# Patient Record
Sex: Male | Born: 1955
Health system: Southern US, Community
[De-identification: ages and names within clinical notes are randomized; demographics above are authoritative.]

## PROBLEM LIST (undated history)

## (undated) DIAGNOSIS — I499 Cardiac arrhythmia, unspecified: Secondary | ICD-10-CM

## (undated) DIAGNOSIS — I429 Cardiomyopathy, unspecified: Secondary | ICD-10-CM

## (undated) DIAGNOSIS — I82409 Acute embolism and thrombosis of unspecified deep veins of unspecified lower extremity: Secondary | ICD-10-CM

## (undated) DIAGNOSIS — G4733 Obstructive sleep apnea (adult) (pediatric): Secondary | ICD-10-CM

## (undated) DIAGNOSIS — I2699 Other pulmonary embolism without acute cor pulmonale: Secondary | ICD-10-CM

## (undated) DIAGNOSIS — E785 Hyperlipidemia, unspecified: Secondary | ICD-10-CM

## (undated) DIAGNOSIS — I878 Other specified disorders of veins: Secondary | ICD-10-CM

## (undated) DIAGNOSIS — I10 Essential (primary) hypertension: Secondary | ICD-10-CM

## (undated) DIAGNOSIS — G4730 Sleep apnea, unspecified: Secondary | ICD-10-CM

## (undated) DIAGNOSIS — L0390 Cellulitis, unspecified: Secondary | ICD-10-CM

## (undated) DIAGNOSIS — Z9989 Dependence on other enabling machines and devices: Secondary | ICD-10-CM

## (undated) DIAGNOSIS — IMO0002 Reserved for concepts with insufficient information to code with codable children: Secondary | ICD-10-CM

## (undated) HISTORY — PX: LEG SURGERY: SHX1003

## (undated) HISTORY — DX: Obstructive sleep apnea (adult) (pediatric): G47.33

## (undated) HISTORY — DX: Hyperlipidemia, unspecified: E78.5

## (undated) HISTORY — PX: CATARACT EXTRACTION: SUR2

## (undated) HISTORY — DX: Dependence on other enabling machines and devices: Z99.89

## (undated) HISTORY — DX: Cardiomyopathy, unspecified: I42.9

---

## 2002-09-19 ENCOUNTER — Encounter: Payer: Self-pay | Admitting: Family Medicine

## 2002-09-19 ENCOUNTER — Encounter: Admission: RE | Admit: 2002-09-19 | Discharge: 2002-09-19 | Payer: Self-pay | Admitting: Family Medicine

## 2003-01-08 ENCOUNTER — Emergency Department (HOSPITAL_COMMUNITY): Admission: EM | Admit: 2003-01-08 | Discharge: 2003-01-08 | Payer: Self-pay | Admitting: Emergency Medicine

## 2007-10-03 DIAGNOSIS — I10 Essential (primary) hypertension: Secondary | ICD-10-CM | POA: Insufficient documentation

## 2007-10-03 DIAGNOSIS — E119 Type 2 diabetes mellitus without complications: Secondary | ICD-10-CM

## 2007-10-03 DIAGNOSIS — R9431 Abnormal electrocardiogram [ECG] [EKG]: Secondary | ICD-10-CM

## 2008-09-04 ENCOUNTER — Observation Stay (HOSPITAL_COMMUNITY): Admission: EM | Admit: 2008-09-04 | Discharge: 2008-09-05 | Payer: Self-pay | Admitting: Emergency Medicine

## 2008-09-04 ENCOUNTER — Encounter: Admission: RE | Admit: 2008-09-04 | Discharge: 2008-09-04 | Payer: Self-pay | Admitting: Family Medicine

## 2009-02-20 ENCOUNTER — Encounter (HOSPITAL_BASED_OUTPATIENT_CLINIC_OR_DEPARTMENT_OTHER): Admission: RE | Admit: 2009-02-20 | Discharge: 2009-05-21 | Payer: Self-pay | Admitting: General Surgery

## 2009-09-03 ENCOUNTER — Inpatient Hospital Stay (HOSPITAL_COMMUNITY): Admission: EM | Admit: 2009-09-03 | Discharge: 2009-09-06 | Payer: Self-pay | Admitting: Emergency Medicine

## 2010-06-16 ENCOUNTER — Emergency Department (HOSPITAL_COMMUNITY): Admission: EM | Admit: 2010-06-16 | Discharge: 2010-06-17 | Payer: Self-pay | Admitting: Emergency Medicine

## 2010-10-27 LAB — COMPREHENSIVE METABOLIC PANEL
ALT: 30 U/L (ref 0–53)
AST: 29 U/L (ref 0–37)
CO2: 28 mEq/L (ref 19–32)
Calcium: 9.3 mg/dL (ref 8.4–10.5)
Creatinine, Ser: 0.9 mg/dL (ref 0.4–1.5)
GFR calc Af Amer: >60 mL/min (ref >60)
GFR calc non Af Amer: >60 mL/min (ref >60)
Sodium: 139 mEq/L (ref 135–145)
Total Protein: 7.4 g/dL (ref 6.0–8.3)

## 2010-10-27 LAB — POCT CARDIAC MARKERS
CKMB, poc: 1.3 ng/mL (ref 1.0–8.0)
Troponin i, poc: <0.05 ng/mL (ref 0.00–0.09)

## 2010-10-27 LAB — CBC
Hemoglobin: 13.1 g/dL (ref 13.0–17.0)
MCH: 27.3 pg (ref 26.0–34.0)
MCHC: 32.1 g/dL (ref 30.0–36.0)
RDW: 14.1 % (ref 11.5–15.5)

## 2010-10-27 LAB — PROTIME-INR: Prothrombin Time: 13.5 seconds (ref 11.6–15.2)

## 2010-10-27 LAB — GLUCOSE, CAPILLARY: Glucose-Capillary: 141 mg/dL — ABNORMAL HIGH (ref 70–99)

## 2010-11-01 LAB — GLUCOSE, CAPILLARY
Glucose-Capillary: 111 mg/dL — ABNORMAL HIGH (ref 70–99)
Glucose-Capillary: 122 mg/dL — ABNORMAL HIGH (ref 70–99)
Glucose-Capillary: 129 mg/dL — ABNORMAL HIGH (ref 70–99)
Glucose-Capillary: 138 mg/dL — ABNORMAL HIGH (ref 70–99)
Glucose-Capillary: 143 mg/dL — ABNORMAL HIGH (ref 70–99)
Glucose-Capillary: 146 mg/dL — ABNORMAL HIGH (ref 70–99)
Glucose-Capillary: 152 mg/dL — ABNORMAL HIGH (ref 70–99)
Glucose-Capillary: 200 mg/dL — ABNORMAL HIGH (ref 70–99)

## 2010-11-01 LAB — DIFFERENTIAL
Basophils Relative: 3 % — ABNORMAL HIGH (ref 0–1)
Eosinophils Absolute: 0.5 10*3/uL (ref 0.0–0.7)
Eosinophils Relative: 4 % (ref 0–5)
Monocytes Absolute: 0.8 10*3/uL (ref 0.1–1.0)
Monocytes Relative: 7 % (ref 3–12)

## 2010-11-01 LAB — COMPREHENSIVE METABOLIC PANEL
AST: 24 U/L (ref 0–37)
CO2: 27 mEq/L (ref 19–32)
Calcium: 8.5 mg/dL (ref 8.4–10.5)
Creatinine, Ser: 0.89 mg/dL (ref 0.4–1.5)
GFR calc Af Amer: >60 mL/min (ref >60)
GFR calc non Af Amer: >60 mL/min (ref >60)
Total Protein: 7.1 g/dL (ref 6.0–8.3)

## 2010-11-01 LAB — CBC
HCT: 42.8 % (ref 39.0–52.0)
Hemoglobin: 14.5 g/dL (ref 13.0–17.0)
MCHC: 33.8 g/dL (ref 30.0–36.0)
MCV: 84.1 fL (ref 78.0–100.0)
RBC: 5.09 MIL/uL (ref 4.22–5.81)
RDW: 14.4 % (ref 11.5–15.5)

## 2010-11-01 LAB — BASIC METABOLIC PANEL
BUN: 11 mg/dL (ref 6–23)
CO2: 24 mEq/L (ref 19–32)
CO2: 27 mEq/L (ref 19–32)
Chloride: 98 mEq/L (ref 96–112)
Chloride: 99 mEq/L (ref 96–112)
Creatinine, Ser: 0.81 mg/dL (ref 0.4–1.5)
GFR calc Af Amer: >60 mL/min (ref >60)
Glucose, Bld: 127 mg/dL — ABNORMAL HIGH (ref 70–99)
Glucose, Bld: 135 mg/dL — ABNORMAL HIGH (ref 70–99)
Sodium: 135 mEq/L (ref 135–145)

## 2010-11-01 LAB — CARDIAC PANEL(CRET KIN+CKTOT+MB+TROPI)
CK, MB: 2.9 ng/mL (ref 0.3–4.0)
Total CK: 128 U/L (ref 7–232)
Troponin I: <0.01 ng/mL (ref 0.00–0.06)

## 2010-11-01 LAB — POCT CARDIAC MARKERS: Troponin i, poc: <0.05 ng/mL (ref 0.00–0.09)

## 2010-11-01 LAB — PROTIME-INR
INR: 1.96 — ABNORMAL HIGH (ref 0.00–1.49)
Prothrombin Time: 22.2 seconds — ABNORMAL HIGH (ref 11.6–15.2)

## 2010-11-01 LAB — CK TOTAL AND CKMB (NOT AT ARMC)
CK, MB: 3.1 ng/mL (ref 0.3–4.0)
Relative Index: 2 (ref 0.0–2.5)
Total CK: 157 U/L (ref 7–232)

## 2010-11-01 LAB — TSH: TSH: 1.077 u[IU]/mL (ref 0.350–4.500)

## 2010-11-30 LAB — URINALYSIS, ROUTINE W REFLEX MICROSCOPIC
Hgb urine dipstick: NEGATIVE
Leukocytes, UA: NEGATIVE
Nitrite: NEGATIVE
Protein, ur: 100 mg/dL — AB
Specific Gravity, Urine: 1.028 (ref 1.005–1.030)
Urobilinogen, UA: 0.2 mg/dL (ref 0.0–1.0)

## 2010-11-30 LAB — URINE MICROSCOPIC-ADD ON

## 2010-11-30 LAB — PROTEIN C ACTIVITY: Protein C Activity: 200 % — ABNORMAL HIGH (ref 75–133)

## 2010-11-30 LAB — DIFFERENTIAL
Eosinophils Absolute: 0.5 10*3/uL (ref 0.0–0.7)
Lymphocytes Relative: 24 % (ref 12–46)
Lymphs Abs: 2.5 10*3/uL (ref 0.7–4.0)
Monocytes Relative: 6 % (ref 3–12)
Neutrophils Relative %: 66 % (ref 43–77)

## 2010-11-30 LAB — BETA-2-GLYCOPROTEIN I ABS, IGG/M/A
Beta-2 Glyco I IgG: 5 U/mL (ref <20)
Beta-2-Glycoprotein I IgA: 6 U/mL (ref <10)
Beta-2-Glycoprotein I IgM: <4 U/mL (ref <10)

## 2010-11-30 LAB — COMPREHENSIVE METABOLIC PANEL
ALT: 43 U/L (ref 0–53)
AST: 35 U/L (ref 0–37)
Calcium: 9 mg/dL (ref 8.4–10.5)
Creatinine, Ser: 0.81 mg/dL (ref 0.4–1.5)
GFR calc Af Amer: >60 mL/min (ref >60)
Glucose, Bld: 142 mg/dL — ABNORMAL HIGH (ref 70–99)
Sodium: 135 mEq/L (ref 135–145)
Total Protein: 7.6 g/dL (ref 6.0–8.3)

## 2010-11-30 LAB — PROTEIN S, TOTAL: Protein S Ag, Total: 99 % (ref 70–140)

## 2010-11-30 LAB — LUPUS ANTICOAGULANT PANEL: Lupus Anticoagulant: NOT DETECTED

## 2010-11-30 LAB — PROTEIN S ACTIVITY: Protein S Activity: 111 % (ref 69–129)

## 2010-11-30 LAB — PROTIME-INR
INR: 1 (ref 0.00–1.49)
Prothrombin Time: 13.9 seconds (ref 11.6–15.2)

## 2010-11-30 LAB — CBC
MCHC: 33.9 g/dL (ref 30.0–36.0)
RDW: 14.1 % (ref 11.5–15.5)

## 2010-11-30 LAB — ANTITHROMBIN III: AntiThromb III Func: 110 % (ref 76–126)

## 2010-11-30 LAB — FACTOR 5 LEIDEN

## 2010-11-30 LAB — BRAIN NATRIURETIC PEPTIDE: Pro B Natriuretic peptide (BNP): 34 pg/mL (ref 0.0–100.0)

## 2010-11-30 LAB — CARDIOLIPIN ANTIBODIES, IGG, IGM, IGA: Anticardiolipin IgA: 16 [APL'U] (ref <13)

## 2010-11-30 LAB — PROTHROMBIN GENE MUTATION

## 2010-12-29 NOTE — Assessment & Plan Note (Signed)
[facility]   NAME:  Victor Barnes, Victor Barnes NO.:  [id number]   MEDICAL RECORD NO.:  [id number]      DATE OF BIRTH:  12-16-1955   PHYSICIAN:  Ardath Sax, M.D.           VISIT DATE:                                   OFFICE VISIT   He comes back for recheck and we took off his boot and put a new Puracol  AG dressing on with hydrogel, and we wrapped it in Kerlix and a regular  Unna boot, and he is continued on his Keflex 500 mg 3 times a day.  He  feels like the ulcer is a lot smaller and especially it is much more  shallow.  It looks nice and clean, and he will come back in a week.      Ardath Sax, M.D.     PP/MEDQ  D:  03/27/2009  T:  03/28/2009  Job:  [id number]

## 2010-12-29 NOTE — Assessment & Plan Note (Signed)
[facility]   NAME:  Victor Barnes, Victor Barnes             ACCOUNT NO.:  [id number]   MEDICAL RECORD NO.:  [id number]      DATE OF BIRTH:  January 27, 1956   PHYSICIAN:  Lenon Curt. Chilton Si, M.D.   VISIT DATE:  03/14/2009                                   OFFICE VISIT   HISTORY:  A 55 year old male returns for recheck of wound of the right  lower extremity and he has had a fairly good week.  Denies any fever,  chills, or wound odor.  There has been minimal discomfort.   PHYSICAL EXAMINATION:  Temperature 98.1, pulse 80, respirations 20,  blood pressure 119/71.  Wound measurement now 2.1 x 0.7 x 0.2 cm of the  right lower leg wound.  There is a small pustule distal to the wound and  proximal to the wound.  The wound itself appears to have a good  granulation base at this time.  There is no slough or debris and there  is no real evidence for infection.  However, the patient does have a  prior positive culture for staph aureus species, which was sensitive to  most antibiotics.  Because of the proximity of the pustules to the  wound, we did culture of the pustule.   TREATMENT:  Puracol Plus Ag, hydrogel, Adaptic, and Unna boot was  applied and covered with Kerlix and Coban.  We gave him a prescription  for cephalexin 500 mg 30 tablets one 3 times daily for infection.   ICD-9 454.2 varicose vein with ulcer.  CPT [id number].      Lenon Curt Chilton Si, M.D.  Electronically Signed     AGG/MEDQ  D:  03/14/2009  T:  03/15/2009  Job:  [id number]

## 2010-12-29 NOTE — Assessment & Plan Note (Signed)
[facility]   NAME:  Victor Barnes, Victor Barnes             ACCOUNT NO.:  [id number]   MEDICAL RECORD NO.:  [id number]      DATE OF BIRTH:  Jul 03, 1956   PHYSICIAN:  Leonie Man, M.D.    VISIT DATE:  04/15/2009                                   OFFICE VISIT   PROBLEM:  Venous stasis disease with venous leg ulcer in the right lower  extremity in a 55 year old gentleman with current wound dimensions of  1.0 x 0.4 x 0.1, which is much smaller than when last seen on April 08, 2009.   The patient on examination today is afebrile with a temperature of 98.8,  pulse is 85, respirations 21, blood pressure is 143/75 with a capillary  blood glucose of 126.  The wound is completely granulated.  There is  some movement of epithelialization in from the side.  There is no  periwound edema.  There is no odor or drainage.   TREATMENT:  Today, collagen matrix dressing for the wound with an Unna  boot to be changed within 1 week.      Leonie Man, M.D.  Electronically Signed     PB/MEDQ  D:  04/15/2009  T:  04/16/2009  Job:  [id number]

## 2010-12-29 NOTE — Assessment & Plan Note (Signed)
[facility]   NAME:  Victor Barnes, UPTAIN             ACCOUNT NO.:  [id number]   MEDICAL RECORD NO.:  [id number]      DATE OF BIRTH:  1955-11-11   PHYSICIAN:  Leonie Man, M.D.    VISIT DATE:  02/25/2009                                   OFFICE VISIT   PROBLEM:  1. Diabetic foot ulcer, right leg medially.  2. History of deep vein thrombosis, right leg, on warfarin.   HISTORY OF PRESENT ILLNESS:  A 55 year old patient referred through the  courtesy of Dr. Laurann Montana for evaluation of this 2.5 x 1.3 x 0.3  right medial leg ulcer.  Mr. Victor Barnes has a history of a large infected  wound in this location some years previously.  This required extensive  debridement and hospitalization and antibiotic therapy.  He subsequently  developed a persistent right-sided leg edema and was later diagnosed  with the DVT.  He is currently on warfarin with an INR controlled at  approximately 2.1.   PAST MEDICAL HISTORY:  No known allergies.   CHRONIC CONDITIONS:  Hypertension, diabetes mellitus, and obesity.   CURRENT MEDICATIONS:  1. Glyburide and metformin 5/500 daily.  2. Warfarin 4 mg daily.  3. Furosemide 40 mg daily.  4. Lisinopril 10/12.5 daily.   REVIEW OF SYSTEMS:  A 15-point review of systems negative except as  outlined in the present illness and past medical history.   PHYSICAL EXAMINATION:  VITAL SIGNS:  Temperature 98.7, pulse 89,  respirations 19, and blood pressure 150/83.  HEAD AND NECK:  No thyromegaly or adenopathy.  Normal carotid upstrokes.  LUNGS:  Clear to auscultation bilaterally.  HEART:  Regular rate and rhythm without murmurs.  ABDOMEN:  Soft, nontender, and nondistended without palpable masses or  visceromegaly.  EXTREMITIES:  Right lower extremity edema with the right side much  larger than the left.  Ulcer is without odor or exudate.  There is  significant amount of slough within the wound and this is somewhat thick  and adherent.   ASSESSMENT:  1. Venous stasis disease with venous leg ulcer, deep vein thrombosis      by history, on anticoagulation therapy.  2. Diabetes mellitus.   TREATMENT:  Today, Santyl wet-to-dry dressings to the right lower  extremity, compression with the Unna boot, right lower extremity.  Cultures were done prior to this.   DISPOSITION:  Plan to return to clinic for change of Unna boots in 3  days which will be February 27, 2009.      Leonie Man, M.D.  Electronically Signed     PB/MEDQ  D:  02/26/2009  T:  02/27/2009  Job:  [id number]

## 2010-12-29 NOTE — Assessment & Plan Note (Signed)
[facility]   NAME:  Victor Barnes, Victor Barnes             ACCOUNT NO.:  [id number]   MEDICAL RECORD NO.:  [id number]      DATE OF BIRTH:  03-Feb-1956   PHYSICIAN:  Lenon Curt. Chilton Si, M.D.   VISIT DATE:  03/07/2009                                   OFFICE VISIT   HISTORY:  A 55 year old male returns for recheck of wounds of the right  lower extremity.  He has been in an Radio broadcast assistant.  He has tolerated this  well.  He has no complaints.  There has been no foul odor to the wound,  increased drainage, fever, or chills.   PHYSICAL EXAMINATION:  Temp 98.9, pulse 85, respirations 16, blood  pressure 122/80.  Capillary glucose 107.  Wound measurement the right  lower leg now at 2.2 x 0.9 x 0.2 cm.  This appears to be improved since  February 24, 2009.  Wound measurements February 28, 2009 appear to be incorrect  in regards to the length of the wound, which at that time was recorded  1.2 cm.  Wound has a good base to it.  There is no excessive drainage  and no odor.   PLAN AND TREATMENT:  The patient had Santyl and an Radio broadcast assistant.  Kerlix  and Coban reapplied to the wound while he was in clinic today.  He will  finish the cephalexin that was started February 28, 2009, for Staphylococcus  aureus that was cultured out of his wound from a culture done February 24, 2009.  He is to return in approximately 2 weeks for reevaluation by  physician and in 1 week for a nurse change of his dressing.      Lenon Curt Chilton Si, M.D.  Electronically Signed     AGG/MEDQ  D:  03/07/2009  T:  03/08/2009  Job:  [id number]

## 2010-12-29 NOTE — Assessment & Plan Note (Signed)
[facility]   NAME:  Victor Barnes, Victor Barnes             ACCOUNT NO.:  [id number]   MEDICAL RECORD NO.:  [id number]      DATE OF BIRTH:  1955-08-30   PHYSICIAN:  Leonie Man, M.D.    VISIT DATE:  04/08/2009                                   OFFICE VISIT   PROBLEM:  Venous stasis disease with venous leg ulcer, right lower  extremity in this 55 year old man.  Wound dimensions currently is 1.2 x  0.9 x less than 0.1.  The patient returns today after having been  treated with an Unna boot over the past week.   PHYSICAL EXAMINATION:  Temperature 98.6, pulse 101, respirations 20, and  blood pressure is 137/76.  Capillary blood glucose is 146.  The wound is  100% granulated and enclosing rapidly.  There is no surrounding  erythema.  Wound edges appear normal.  There is no odor or drainage.   TREATMENT:  Puracol AG with hydrogel and Unna boot to the right lower  extremity.   DISPOSITION:  Follow up in 1 week.      Leonie Man, M.D.  Electronically Signed     PB/MEDQ  D:  04/08/2009  T:  04/09/2009  Job:  [id number]

## 2010-12-29 NOTE — H&P (Signed)
Victor Barnes, Victor Barnes             ACCOUNT NO.:  [id number]   MEDICAL RECORD NO.:  [id number]          PATIENT TYPE:  INP   LOCATION:  3733                         FACILITY:  [facility]   PHYSICIAN:  Kela Millin, M.D.DATE OF BIRTH:  06-Apr-1956   DATE OF ADMISSION:  09/04/2008  DATE OF DISCHARGE:                              HISTORY & PHYSICAL   PRIMARY CARE PHYSICIAN:  Tally Joe, M.D.   CHIEF COMPLAINT:  Right leg swelling per PCP.  Positive lower extremity  Doppler ultrasound.   HISTORY OF PRESENT ILLNESS:  The patient is an obese 55 year old white  male with past medical history significant for right leg DVT in 2008 for  which he was on anticoagulation for 3 months, diabetes mellitus and  hypertension who presents with the above complaints.  He states that he  has had increasing right leg swelling for the past week.  He denies  redness, pain, fevers, shortness of breath, chest pain, diarrhea, melena  and no hematochezia.  He also denies any recent travel by car or plane.  He saw his primary care physician and an outpatient lower extremity  Doppler ultrasound was ordered.  It revealed DVT in the right mid and  distal superficial femoral vein and the patient was asked to come to the  ED for admission.   In the ED he had a chest x-ray which showed no acute findings.  A brain  natriuretic peptide was done which was within normal limits at 34 and an  EKG revealed normal sinus rhythm at a rate of 76.  No acute ischemic  changes.  He is admitted for further evaluation and management.   PAST MEDICAL HISTORY:  1. As above.  2. History of right lower extremity cellulitis/? localized infection      which required surgery in 2004.   MEDICATIONS:  1. Glucovance 5/500 one p.o. b.i.d.  2. Lisinopril 10/12.5 mg p.o. daily.   ALLERGIES:  NKDA.   SOCIAL HISTORY:  He denies tobacco, also denies alcohol.   FAMILY HISTORY:  Reviewed and noncontributory to current illness.   REVIEW OF  SYSTEMS:  As per HPI, other review of systems negative.   PHYSICAL EXAM:  GENERAL:  The patient is an obese, middle-aged white  male in no apparent distress.  VITAL SIGNS:  Temperature is 98.4 with a blood pressure of 150/93, pulse  of 87, respiratory rate of 22, O2 sat of 96%.  HEENT:  PERRL, EOMI, sclerae anicteric, moist mucous membranes and no  oral exudates.  NECK:  Supple, no adenopathy, no thyromegaly and no JVD.  LUNGS:  Clear to auscultation bilaterally.  No crackles.  No wheezes.  CARDIOVASCULAR:  Regular rate and rhythm.  Normal S1 and S2.  ABDOMEN:  Obese, soft, bowel sounds present, nontender, nondistended.  No organomegaly and no masses palpable.  EXTREMITIES:  His right lower extremity up to the knee is markedly  edematous, nonpitting, nontender.  No erythema.  Denna Haggard' sign is  negative.  No calf tenderness.  At the medial aspect of his lower leg a  little above the ankle there is a small shallow ulcer,  no  drainage/exudates.   LABORATORY DATA:  Lower extremity Doppler ultrasound as per HPI.  Also,  the white cell count is 10.4, hemoglobin is 14.4, hematocrit is 42.6,  MCV is 84.6, platelet count is 241, neutrophil count is 66% and the INR  is 1.0, PTT is 28.  Sodium is 135 with a potassium of 4.0, chloride is  102, CO2 of 24, glucose 142, BUN of 8, creatinine 0.81, calcium 9.0,  total protein is 7.6, albumin is 4.1, AST is 35, ALT 43, brain  natriuretic peptide is 34.  Chest x-ray - no acute findings.   ASSESSMENT AND PLAN:  1. Right lower extremity deep vein thrombosis - as discussed above,      second episode.  Will obtain hypercoagulable panel, observe      overnight, start on Lovenox and Coumadin.  Monitor PT/INR and      follow.  2. Hypertension - continue outpatient medications.  3. Diabetes mellitus - monitor Accu-Cheks, continue Glucovance and      cover with sliding scale insulin.      Kela Millin, M.D.  Electronically Signed     ACV/MEDQ  D:   09/05/2008  T:  09/05/2008  Job:  [id number]   cc:   Tally Joe, M.D.

## 2011-07-15 ENCOUNTER — Inpatient Hospital Stay (HOSPITAL_COMMUNITY)
Admission: EM | Admit: 2011-07-15 | Discharge: 2011-07-19 | DRG: 078 | Disposition: A | Discharge status: Home / Self Care | Payer: BC Managed Care – PPO | Attending: Internal Medicine | Admitting: Internal Medicine

## 2011-07-15 ENCOUNTER — Encounter: Payer: Self-pay | Admitting: Emergency Medicine

## 2011-07-15 DIAGNOSIS — E669 Obesity, unspecified: Secondary | ICD-10-CM | POA: Diagnosis present

## 2011-07-15 DIAGNOSIS — E119 Type 2 diabetes mellitus without complications: Secondary | ICD-10-CM | POA: Diagnosis present

## 2011-07-15 DIAGNOSIS — I4892 Unspecified atrial flutter: Secondary | ICD-10-CM | POA: Diagnosis present

## 2011-07-15 DIAGNOSIS — I87009 Postthrombotic syndrome without complications of unspecified extremity: Secondary | ICD-10-CM

## 2011-07-15 DIAGNOSIS — I498 Other specified cardiac arrhythmias: Secondary | ICD-10-CM

## 2011-07-15 DIAGNOSIS — I2699 Other pulmonary embolism without acute cor pulmonale: Principal | ICD-10-CM | POA: Diagnosis present

## 2011-07-15 DIAGNOSIS — IMO0002 Reserved for concepts with insufficient information to code with codable children: Secondary | ICD-10-CM | POA: Diagnosis present

## 2011-07-15 DIAGNOSIS — Z9119 Patient's noncompliance with other medical treatment and regimen: Secondary | ICD-10-CM

## 2011-07-15 DIAGNOSIS — L0390 Cellulitis, unspecified: Secondary | ICD-10-CM

## 2011-07-15 DIAGNOSIS — G4730 Sleep apnea, unspecified: Secondary | ICD-10-CM

## 2011-07-15 DIAGNOSIS — I878 Other specified disorders of veins: Secondary | ICD-10-CM | POA: Diagnosis present

## 2011-07-15 DIAGNOSIS — I82409 Acute embolism and thrombosis of unspecified deep veins of unspecified lower extremity: Secondary | ICD-10-CM | POA: Diagnosis present

## 2011-07-15 DIAGNOSIS — I872 Venous insufficiency (chronic) (peripheral): Secondary | ICD-10-CM | POA: Diagnosis present

## 2011-07-15 DIAGNOSIS — Z91199 Patient's noncompliance with other medical treatment and regimen due to unspecified reason: Secondary | ICD-10-CM

## 2011-07-15 DIAGNOSIS — I10 Essential (primary) hypertension: Secondary | ICD-10-CM | POA: Diagnosis present

## 2011-07-15 HISTORY — DX: Other specified disorders of veins: I87.8

## 2011-07-15 HISTORY — DX: Sleep apnea, unspecified: G47.30

## 2011-07-15 HISTORY — DX: Cellulitis, unspecified: L03.90

## 2011-07-15 HISTORY — DX: Acute embolism and thrombosis of unspecified deep veins of unspecified lower extremity: I82.409

## 2011-07-15 HISTORY — DX: Essential (primary) hypertension: I10

## 2011-07-15 HISTORY — DX: Reserved for concepts with insufficient information to code with codable children: IMO0002

## 2011-07-15 LAB — DIFFERENTIAL
Eosinophils Absolute: 0.4 10*3/uL (ref 0.0–0.7)
Eosinophils Relative: 4 % (ref 0–5)
Lymphs Abs: 3.3 10*3/uL (ref 0.7–4.0)
Monocytes Relative: 7 % (ref 3–12)
Neutrophils Relative %: 62 % (ref 43–77)

## 2011-07-15 LAB — CBC
HCT: 41 % (ref 39.0–52.0)
Hemoglobin: 14.2 g/dL (ref 13.0–17.0)
MCH: 29 pg (ref 26.0–34.0)
MCV: 83.8 fL (ref 78.0–100.0)
RBC: 4.89 MIL/uL (ref 4.22–5.81)

## 2011-07-15 LAB — PROTIME-INR: Prothrombin Time: 14.5 seconds (ref 11.6–15.2)

## 2011-07-15 LAB — APTT: aPTT: 28 seconds (ref 24–37)

## 2011-07-15 MED ORDER — SODIUM CHLORIDE 0.9 % IV SOLN
Freq: Once | INTRAVENOUS | Status: AC
  Administered 2011-07-16: via INTRAVENOUS

## 2011-07-15 MED ORDER — DILTIAZEM HCL 25 MG/5ML IV SOLN
10.0000 mg | Freq: Once | INTRAVENOUS | Status: AC
  Administered 2011-07-15: 10 mg via INTRAVENOUS
  Filled 2011-07-15: qty 5

## 2011-07-15 MED ORDER — CLINDAMYCIN PHOSPHATE 600 MG/50ML IV SOLN
600.0000 mg | Freq: Once | INTRAVENOUS | Status: AC
  Administered 2011-07-16: 600 mg via INTRAVENOUS

## 2011-07-15 MED ORDER — SODIUM CHLORIDE 0.9 % IV BOLUS (SEPSIS)
1000.0000 mL | Freq: Once | INTRAVENOUS | Status: AC
  Administered 2011-07-15: 1000 mL via INTRAVENOUS

## 2011-07-15 MED ORDER — DILTIAZEM HCL 100 MG IV SOLR: 5.0000 mg/h | INTRAVENOUS | Status: DC

## 2011-07-15 MED ORDER — DILTIAZEM HCL 100 MG IV SOLR
5.0000 mg/h | INTRAVENOUS | Status: DC
  Administered 2011-07-15: 5 mg/h via INTRAVENOUS
  Administered 2011-07-16: 20 mg/h via INTRAVENOUS
  Administered 2011-07-16 – 2011-07-17 (×2): 15 mg/h via INTRAVENOUS
  Filled 2011-07-15 (×3): qty 100

## 2011-07-15 NOTE — ED Notes (Signed)
Pt continues to be tachycardic 148-150, will facilitate pt being moved to room

## 2011-07-15 NOTE — ED Provider Notes (Signed)
History     CSN: [id number] Arrival date & time: 07/15/2011  8:36 PM   First MD Initiated Contact with Patient 07/15/11 2227      Chief Complaint  Patient presents with  . Leg Pain    (Consider location/radiation/quality/duration/timing/severity/associated sxs/prior treatment) HPI Comments: Patient presents to the emergency department with a chief complaint of right lower leg swelling and pain.  Of note he had a history of DVT and is supposed to be on Coumadin however is not taking his medication.  In addition patient has a history of cellulitis in that leg as well.  He is a diabetic but is noncompliant on his medication.  Patient denies fevers, night sweats, chills, recent travel, CP, hemoptysis, PND and orthopnea. Reports unilateral leg swelling (right) & occasional SOB/DOE. No other complaints.  Patient is a 55 y.o. male presenting with leg pain. The history is provided by the patient.  Leg Pain     Past Medical History  Diagnosis Date  . Hypertension   . Diabetes mellitus   . Sleep apnea   . Venous stasis   . DVT (deep venous thrombosis)   . Cellulitis     Past Surgical History  Procedure Date  . Leg surgery     No family history on file.  History  Substance Use Topics  . Smoking status: Never Smoker   . Smokeless tobacco: Not on file  . Alcohol Use: Yes      Review of Systems  Constitutional: Negative for fever, chills, diaphoresis, fatigue and unexpected weight change.  HENT: Negative for neck stiffness.   Eyes: Negative for visual disturbance.  Respiratory: Positive for shortness of breath. Negative for cough, chest tightness and wheezing.   Cardiovascular: Positive for leg swelling. Negative for chest pain and palpitations.  Gastrointestinal: Negative for nausea, vomiting and abdominal pain.  Genitourinary: Negative for dysuria and urgency.  Musculoskeletal: Negative for back pain and gait problem.  Skin: Positive for rash and wound.  Neurological:  Negative for facial asymmetry, speech difficulty, weakness, light-headedness and headaches.  Psychiatric/Behavioral: Negative for confusion.  All other systems reviewed and are negative.    Allergies  Review of patient's allergies indicates no known allergies.  Home Medications  No current outpatient prescriptions on file.  BP 144/69  Pulse 92  Temp(Src) 98.7 F (37.1 C) (Oral)  Resp 16  SpO2 100%  Physical Exam  Nursing note and vitals reviewed. Constitutional: He is oriented to person, place, and time. He appears well-developed and well-nourished. No distress.  HENT:  Head: Normocephalic and atraumatic.  Eyes: EOM are normal.       Normal appearance  Neck: Normal range of motion.  Cardiovascular: Regular rhythm and intact distal pulses.  Tachycardia present.   Pulmonary/Chest: Effort normal and breath sounds normal. Not tachypneic. No respiratory distress. He exhibits no tenderness.  Abdominal: Soft. Bowel sounds are normal. He exhibits no distension. There is no tenderness. There is no guarding.  Musculoskeletal: Normal range of motion. He exhibits no edema and no tenderness.       Right lower leg: He exhibits tenderness and swelling. He exhibits no edema.       2+ Dorsalis Pedis pulses  Neurological: He is alert and oriented to person, place, and time.  Skin: Skin is warm and dry. Rash noted. He is not diaphoretic.     Psychiatric: He has a normal mood and affect. His behavior is normal.    ED Course  Procedures (including critical care time)  Labs  Reviewed  CBC - Abnormal; Notable for the following:    WBC 12.4 (*)    All other components within normal limits  COMPREHENSIVE METABOLIC PANEL - Abnormal; Notable for the following:    Glucose, Bld 259 (*)    All other components within normal limits  GLUCOSE, CAPILLARY - Abnormal; Notable for the following:    Glucose-Capillary 258 (*)    All other components within normal limits  DIFFERENTIAL  APTT    PROTIME-INR  CULTURE, BLOOD (ROUTINE X 2)  CULTURE, BLOOD (ROUTINE X 2)  POCT CBG MONITORING   Dg Chest 2 View  07/16/2011  *RADIOLOGY REPORT*  Clinical Data: Shortness of breath and palpitations; right leg swelling and pain.  CHEST - 2 VIEW  Comparison: Chest radiograph performed 06/16/2010  Findings: The lungs are well-aerated.  Mild stable vascular congestion is noted.  There is no evidence of focal opacification, pleural effusion or pneumothorax.  The heart is normal in size; the mediastinal contour is within normal limits.  No acute osseous abnormalities are seen.  There is mild chronic anterior wedging at the lower thoracic spine.  IMPRESSION: Mild stable vascular congestion noted; lungs remain grossly clear.  Original Report Authenticated By: Tonia Ghent, M.D.    Pt being admitted to medicine. Started on clinda 600 for cellulitis, Lovenox given bc pt not therapeutic, rate controlled with Cardizem drip.  Discussed possible DVT r/o with admitting doctor Inpt, telemetry, dx: cellulitis, Dr. Kaylyn Layer [facility] Team 6   MDM  Cellulitis Uncontrolled DM Flutter Non therapeutic INR (non compliance)  Hyperglycemia        [place], [place] 07/16/11 0132

## 2011-07-15 NOTE — ED Notes (Signed)
Pt has a reddened area on his right lower leg. Pt states that he has had cellulitis in his right lower leg in the past. Pt states that he went to sleep and the area was not red throughout the day today the area increased in size and redness. Pt alert and oriented and able to move extremities and follow commands.

## 2011-07-15 NOTE — ED Notes (Signed)
PT. REPORTS RIGHT LOWER LEG PAIN /RIGHT ANKLE PAIN WITH SWELLING ONSET YESTERDAY , DENIES INJURY OR FALL , NO FEVER OR CHILLS.

## 2011-07-15 NOTE — ED Notes (Signed)
Pt does not appear in any distress at this time, delay explained.  Will recheck HR

## 2011-07-16 ENCOUNTER — Emergency Department (HOSPITAL_COMMUNITY): Payer: BC Managed Care – PPO

## 2011-07-16 ENCOUNTER — Encounter (HOSPITAL_COMMUNITY): Payer: Self-pay | Admitting: Internal Medicine

## 2011-07-16 DIAGNOSIS — L0390 Cellulitis, unspecified: Secondary | ICD-10-CM | POA: Diagnosis present

## 2011-07-16 DIAGNOSIS — G4730 Sleep apnea, unspecified: Secondary | ICD-10-CM | POA: Insufficient documentation

## 2011-07-16 DIAGNOSIS — I82409 Acute embolism and thrombosis of unspecified deep veins of unspecified lower extremity: Secondary | ICD-10-CM | POA: Diagnosis present

## 2011-07-16 DIAGNOSIS — IMO0002 Reserved for concepts with insufficient information to code with codable children: Secondary | ICD-10-CM | POA: Diagnosis present

## 2011-07-16 DIAGNOSIS — I10 Essential (primary) hypertension: Secondary | ICD-10-CM | POA: Diagnosis present

## 2011-07-16 DIAGNOSIS — E119 Type 2 diabetes mellitus without complications: Secondary | ICD-10-CM | POA: Diagnosis present

## 2011-07-16 DIAGNOSIS — I878 Other specified disorders of veins: Secondary | ICD-10-CM | POA: Diagnosis present

## 2011-07-16 LAB — COMPREHENSIVE METABOLIC PANEL
Albumin: 3.7 g/dL (ref 3.5–5.2)
BUN: 12 mg/dL (ref 6–23)
Chloride: 99 mEq/L (ref 96–112)
Creatinine, Ser: 0.84 mg/dL (ref 0.50–1.35)
GFR calc Af Amer: >90 mL/min (ref >90)
GFR calc non Af Amer: >90 mL/min (ref >90)
Total Bilirubin: 0.5 mg/dL (ref 0.3–1.2)

## 2011-07-16 LAB — GLUCOSE, CAPILLARY
Glucose-Capillary: 290 mg/dL — ABNORMAL HIGH (ref 70–99)
Glucose-Capillary: 262 mg/dL — ABNORMAL HIGH (ref 70–99)
Glucose-Capillary: 239 mg/dL — ABNORMAL HIGH (ref 70–99)

## 2011-07-16 LAB — CARDIAC PANEL(CRET KIN+CKTOT+MB+TROPI)
Relative Index: INVALID (ref 0.0–2.5)
Relative Index: INVALID (ref 0.0–2.5)
Total CK: 90 U/L (ref 7–232)
Total CK: 65 U/L (ref 7–232)

## 2011-07-16 LAB — BASIC METABOLIC PANEL
CO2: 23 mEq/L (ref 19–32)
Chloride: 101 mEq/L (ref 96–112)
Creatinine, Ser: 0.68 mg/dL (ref 0.50–1.35)
Sodium: 135 mEq/L (ref 135–145)

## 2011-07-16 LAB — CBC
HCT: 39.1 % (ref 39.0–52.0)
MCV: 83.4 fL (ref 78.0–100.0)
RBC: 4.69 MIL/uL (ref 4.22–5.81)
WBC: 10.5 10*3/uL (ref 4.0–10.5)

## 2011-07-16 LAB — HEMOGLOBIN A1C: Mean Plasma Glucose: 212 mg/dL — ABNORMAL HIGH (ref <117)

## 2011-07-16 LAB — MRSA PCR SCREENING: MRSA by PCR: NEGATIVE

## 2011-07-16 MED ORDER — WARFARIN SODIUM 10 MG PO TABS
10.0000 mg | ORAL_TABLET | Freq: Once | ORAL | Status: AC
  Administered 2011-07-16: 10 mg via ORAL
  Filled 2011-07-16: qty 1

## 2011-07-16 MED ORDER — DOCUSATE SODIUM 100 MG PO CAPS
100.0000 mg | ORAL_CAPSULE | Freq: Two times a day (BID) | ORAL | Status: DC
  Administered 2011-07-16 – 2011-07-17 (×3): 100 mg via ORAL
  Filled 2011-07-16 (×8): qty 1

## 2011-07-16 MED ORDER — ENOXAPARIN SODIUM 150 MG/ML ~~LOC~~ SOLN
150.0000 mg | Freq: Once | SUBCUTANEOUS | Status: DC
  Filled 2011-07-16: qty 1

## 2011-07-16 MED ORDER — DILTIAZEM HCL 60 MG PO TABS: 60.0000 mg | ORAL_TABLET | Freq: Three times a day (TID) | ORAL | Status: DC

## 2011-07-16 MED ORDER — ONDANSETRON HCL 4 MG/2ML IJ SOLN: 4.0000 mg | Freq: Four times a day (QID) | INTRAMUSCULAR | Status: DC | PRN

## 2011-07-16 MED ORDER — ACETAMINOPHEN 325 MG PO TABS
650.0000 mg | ORAL_TABLET | Freq: Four times a day (QID) | ORAL | Status: DC | PRN
  Administered 2011-07-16: 650 mg via ORAL
  Filled 2011-07-16: qty 2

## 2011-07-16 MED ORDER — ONDANSETRON HCL 4 MG PO TABS: 4.0000 mg | ORAL_TABLET | Freq: Four times a day (QID) | ORAL | Status: DC | PRN

## 2011-07-16 MED ORDER — DILTIAZEM HCL 60 MG PO TABS
60.0000 mg | ORAL_TABLET | ORAL | Status: AC
  Administered 2011-07-16: 60 mg via ORAL
  Filled 2011-07-16: qty 1

## 2011-07-16 MED ORDER — CLINDAMYCIN PHOSPHATE 600 MG/50ML IV SOLN: 600.0000 mg | Freq: Once | INTRAVENOUS | Status: DC

## 2011-07-16 MED ORDER — CLINDAMYCIN PHOSPHATE 600 MG/50ML IV SOLN
600.0000 mg | Freq: Three times a day (TID) | INTRAVENOUS | Status: DC
  Administered 2011-07-16 (×2): 600 mg via INTRAVENOUS
  Filled 2011-07-16 (×5): qty 50

## 2011-07-16 MED ORDER — SODIUM CHLORIDE 0.9 % IJ SOLN
3.0000 mL | Freq: Two times a day (BID) | INTRAMUSCULAR | Status: DC
  Administered 2011-07-16 – 2011-07-17 (×2): 3 mL via INTRAVENOUS

## 2011-07-16 MED ORDER — SODIUM CHLORIDE 0.9 % IJ SOLN: 3.0000 mL | INTRAMUSCULAR | Status: DC | PRN

## 2011-07-16 MED ORDER — DILTIAZEM HCL ER 90 MG PO CP12: 90.0000 mg | ORAL_CAPSULE | Freq: Two times a day (BID) | ORAL | Status: DC

## 2011-07-16 MED ORDER — DEXTROSE 5 % IV SOLN
INTRAVENOUS | Status: AC
  Filled 2011-07-16: qty 50

## 2011-07-16 MED ORDER — ACETAMINOPHEN 650 MG RE SUPP: 650.0000 mg | Freq: Four times a day (QID) | RECTAL | Status: DC | PRN

## 2011-07-16 MED ORDER — SENNA 8.6 MG PO TABS
1.0000 | ORAL_TABLET | Freq: Two times a day (BID) | ORAL | Status: DC
  Administered 2011-07-16 – 2011-07-17 (×3): 8.6 mg via ORAL
  Filled 2011-07-16 (×9): qty 1

## 2011-07-16 MED ORDER — INSULIN ASPART 100 UNIT/ML ~~LOC~~ SOLN
0.0000 [IU] | Freq: Three times a day (TID) | SUBCUTANEOUS | Status: DC
  Administered 2011-07-16: 5 [IU] via SUBCUTANEOUS
  Administered 2011-07-16: 3 [IU] via SUBCUTANEOUS
  Administered 2011-07-16: 5 [IU] via SUBCUTANEOUS
  Administered 2011-07-17: 3 [IU] via SUBCUTANEOUS
  Filled 2011-07-16: qty 3

## 2011-07-16 MED ORDER — SODIUM CHLORIDE 0.9 % IV SOLN: 250.0000 mL | INTRAVENOUS | Status: DC | PRN

## 2011-07-16 MED ORDER — ZOLPIDEM TARTRATE 5 MG PO TABS: 5.0000 mg | ORAL_TABLET | Freq: Every evening | ORAL | Status: DC | PRN

## 2011-07-16 MED ORDER — IOHEXOL 350 MG/ML SOLN
180.0000 mL | Freq: Once | INTRAVENOUS | Status: AC | PRN
  Administered 2011-07-16: 180 mL via INTRAVENOUS

## 2011-07-16 MED ORDER — LEVOFLOXACIN IN D5W 500 MG/100ML IV SOLN
500.0000 mg | INTRAVENOUS | Status: DC
  Administered 2011-07-16: 500 mg via INTRAVENOUS
  Filled 2011-07-16 (×2): qty 100

## 2011-07-16 MED ORDER — ENOXAPARIN SODIUM 150 MG/ML ~~LOC~~ SOLN
1.0000 mg/kg | Freq: Two times a day (BID) | SUBCUTANEOUS | Status: DC
  Administered 2011-07-16 – 2011-07-18 (×5): 150 mg via SUBCUTANEOUS
  Filled 2011-07-16 (×7): qty 1

## 2011-07-16 MED ORDER — CLINDAMYCIN PHOSPHATE 600 MG/4ML IJ SOLN
INTRAMUSCULAR | Status: AC
  Filled 2011-07-16: qty 4

## 2011-07-16 NOTE — H&P (Signed)
PCP:  [facility]  Chief Complaint:  RLE swelling   HPI:  37yoM with h/o Aflutter, obesity, DM2, h/o RLE infection necessitating surgical  management and h/o recurrent RLE DVT who is supposed to be on chronic Coumadin but has  not been recently compliant due to monetary difficulty, presents with RLE erythema and  pain, and found to be in AFlutter with RVR, now with CTA showing bilateral PE's.    Pt states he's had recurrent RLE DVT's for which he's supposed to be on Coumadin, but  had financial hardship and has been off all his meds for past 90 days. However, he also  states that years ago he had what sounds like an abscess or deep infection of the RLE  for which a surgeon had to drain his leg, and since then there's been a scar on his RLE  with chronic dark purple/red color around it. He also states that at baseline, his RLE  is larger than his LLE.   However, over the past couple days, the RLE has gotten more swollen compared the LLE,  even above the baseline asymmetry, and the RLE shin has gotten much redder and spread  out compared to the baseline. It's more painful as well. He states that this  presentation is more similar to when he had the infection before, and not like the  previous DVT's which were more proximal and had more proximal swelling. For these  symptoms he came to the ED and was found to be in asymptomatic AFlutter with RVR.   In the ED initial vitals showed HR 150, 130/87, other vitals stable. Chem panel was  normal except glucose 259, LFT's normal, WBC 12.4 o/w CBC normal, INR 1.11, BCx pending  x2, CXR showed mild stable vascular congestion without focal opacification. CTA was  also ordered in the ED, with radiology calling me just now to report bilateral PE's.   Of note, pt was last admitted 08/2009 for asymptomatic AFib/flutter with RVR that  converted to sinus after initiation of sotalol, and Cardizem drip was stopped. Sleep    apnea was suspected during admission with recommendation for sleep study as outpt.  Per last d/c summary, medication list was KCL 10 daily, Sotalol 120 mg BID,  Glyburide/Metformin 2.5/500, 2 tablets every morning, Lisinopril/HCTZ 10/12.5 daily,  and Coumadin 4 mg daily.   Presently, pt relates the above history and denies any chest pain, pressure,  palpitations, SOB, dizziness, or any other sxs from the RVR. No GI, bladder, or bowel  symptoms. ROS o/w negative. He states he was only taking Lisinopril, Coumadin, and  Metformin and that Sotalol was stopped awhile back as his AFib was better controlled  after he started using breathing machine when sleeping. He also denies being on any  rate control agents.      Past Medical History  Diagnosis Date  . Hypertension   . Sleep apnea   . Venous stasis   . DVT (deep venous thrombosis)     Recurrent RLE DVT, supposed to be on Coumadin but h/o non-compliance   . Cellulitis   . Atrial fibrillation or flutter     Should be on Coumadin but h/o non-compliance; started Sotalol 08/2009  . Diabetes mellitus     Past Surgical History  Procedure Date  . Leg surgery     Medications:  HOME MEDS:   Per last d/c summary, medication list was KCL 10 daily, Sotalol 120 mg BID,  Glyburide/Metformin 2.5/500, 2  tablets every morning, Lisinopril/HCTZ 10/12.5 daily,  and Coumadin 4 mg daily. However pt unable to afford his meds recently.   Prior to Admission medications   Not on File    Allergies:  No Known Allergies  Social History:   reports that he has never smoked. He does not have any smokeless tobacco history on file. He reports that he drinks alcohol. He reports that he does not use illicit drugs.  Family History: No family history on file.  Physical Exam: Filed Vitals:   07/15/11 2145 07/15/11 2218 07/16/11 0000 07/16/11 0220  BP:  135/79 144/69 113/71  Pulse: 148 148 92 101  Temp:      TempSrc:      Resp:  16  19  SpO2:  97%  100% 98%   Blood pressure 113/71, pulse 101, temperature 98.7 F (37.1 C), temperature source Oral, resp. rate 19, SpO2 98.00%.  Gen: Very obese, large man in ED stretcher, appears well and is nice, able to relate  his history well. No dypnea, discomfort, distress HEENT: PERRL, EOMI, sclera clear. Has prominent deviated septum with part of it visible  through his left nostril. Mouth fairly normal appearing Neck: Thick, but jugular pulsations noted midway up SCM Lungs: CTAB no w/c/r with good air movement Heart: Regular and tachycardic but no m/g. Radial pulses are also rapid and palpable  bilaterally Abd: Very large, but soft, benign, non-tender Extrem: Warm, perfusing, quite large but with normal tone. BUE's normal. RLE distal has  a moderately large area of poorly defined erythema and petechiae surrounding a prior  hypopigmented scar, immediately surrounding the scar is a ring of dark purple. The  whole area is quite hot to touch compared to contralateral leg, which is much cooler  and normal temp. Difficult to ascertain if it's much more swollen given that both are  big at baseline. Bilateral DP's palpable.  Neuro: Alert, conversant, grossly non-focal.      Labs & Imaging Results for orders placed during the hospital encounter of 07/15/11 (from the past 48 hour(s))  GLUCOSE, CAPILLARY     Status: Abnormal   Collection Time   07/15/11 11:01 PM      Component Value Range Comment   Glucose-Capillary 258 (*) 70 - 99 (mg/dL)    Comment 1 Documented in Chart     CBC     Status: Abnormal   Collection Time   07/15/11 11:19 PM      Component Value Range Comment   WBC 12.4 (*) 4.0 - 10.5 (K/uL)    RBC 4.89  4.22 - 5.81 (MIL/uL)    Hemoglobin 14.2  13.0 - 17.0 (g/dL)    HCT 16.1  09.6 - 04.5 (%)    MCV 83.8  78.0 - 100.0 (fL)    MCH 29.0  26.0 - 34.0 (pg)    MCHC 34.6  30.0 - 36.0 (g/dL)    RDW 40.9  81.1 - 91.4 (%)    Platelets 192  150 - 400 (K/uL)   DIFFERENTIAL     Status:  Normal   Collection Time   07/15/11 11:19 PM      Component Value Range Comment   Neutrophils Relative 62  43 - 77 (%)    Neutro Abs 7.7  1.7 - 7.7 (K/uL)    Lymphocytes Relative 26  12 - 46 (%)    Lymphs Abs 3.3  0.7 - 4.0 (K/uL)    Monocytes Relative 7  3 - 12 (%)  Monocytes Absolute 0.9  0.1 - 1.0 (K/uL)    Eosinophils Relative 4  0 - 5 (%)    Eosinophils Absolute 0.4  0.0 - 0.7 (K/uL)    Basophils Relative 1  0 - 1 (%)    Basophils Absolute 0.1  0.0 - 0.1 (K/uL)   COMPREHENSIVE METABOLIC PANEL     Status: Abnormal   Collection Time   07/15/11 11:19 PM      Component Value Range Comment   Sodium 136  135 - 145 (mEq/L)    Potassium 3.6  3.5 - 5.1 (mEq/L)    Chloride 99  96 - 112 (mEq/L)    CO2 25  19 - 32 (mEq/L)    Glucose, Bld 259 (*) 70 - 99 (mg/dL)    BUN 12  6 - 23 (mg/dL)    Creatinine, Ser 4.54  0.50 - 1.35 (mg/dL)    Calcium 9.2  8.4 - 10.5 (mg/dL)    Total Protein 7.3  6.0 - 8.3 (g/dL)    Albumin 3.7  3.5 - 5.2 (g/dL)    AST 20  0 - 37 (U/L)    ALT 31  0 - 53 (U/L)    Alkaline Phosphatase 93  39 - 117 (U/L)    Total Bilirubin 0.5  0.3 - 1.2 (mg/dL)    GFR calc non Af Amer >90  >90 (mL/min)    GFR calc Af Amer >90  >90 (mL/min)   APTT     Status: Normal   Collection Time   07/15/11 11:19 PM      Component Value Range Comment   aPTT 28  24 - 37 (seconds)   PROTIME-INR     Status: Normal   Collection Time   07/15/11 11:19 PM      Component Value Range Comment   Prothrombin Time 14.5  11.6 - 15.2 (seconds)    INR 1.11  0.00 - 1.49     Dg Chest 2 View  07/16/2011  *RADIOLOGY REPORT*  Clinical Data: Shortness of breath and palpitations; right leg swelling and pain.  CHEST - 2 VIEW  Comparison: Chest radiograph performed 06/16/2010  Findings: The lungs are well-aerated.  Mild stable vascular congestion is noted.  There is no evidence of focal opacification, pleural effusion or pneumothorax.  The heart is normal in size; the mediastinal contour is within normal  limits.  No acute osseous abnormalities are seen.  There is mild chronic anterior wedging at the lower thoracic spine.  IMPRESSION: Mild stable vascular congestion noted; lungs remain grossly clear.  Original Report Authenticated By: Tonia Ghent, M.D.   ECG:  11/29, 2215p: wide complex tachycardia at 150 bpm with F waves noted in III, RAD, no  frank ST segment deviations, probably Aflutter with 2:1 block.   08/2009 prior ECG: sinus 67 bpm, normal axis, P-mitrale in II, LAE in V1, incomplete  RBBB with QRS 100 msec, no ST segment deviation, normal T waves.   08/2009  Echo Study Conclusions   - Left ventricle: The cavity size was normal. There was moderate     concentric hypertrophy. Systolic function was normal. The     estimated ejection fraction was in the range of 50% to 55%.     Regional wall motion abnormalities cannot be excluded.   - Left atrium: The atrium was mildly dilated.  Impression Present on Admission:  .Atrial fibrillation or flutter .Hypertension .Diabetes mellitus .Cellulitis .Venous stasis .DVT (deep venous thrombosis)  55yoM with h/o Aflutter, obesity, DM2, h/o  RLE infection necessitating surgical  management and h/o recurrent RLE DVT who is supposed to be on chronic Coumadin but has  not been recently compliant due to monetary difficulty, presents with RLE erythema and  pain, and found to be in AFlutter with RVR, now with CTA showing bilateral PE's.    1. AFlutter: With RVR to 140-150 despite 10mg /hr Diltiazem. We increased it to 15/hr  while I evaluated him and it's come down to 90's. Totally asymptomatic. Will give some  oral Diltiazem as well. CHADS2 score = 2 for HTN and diabetes, will restart Coumadin  and cover with ASA 325 daily until therapeutic.   - Continue cardizem drip and will add oral nodal agent.  - Restart Coumadin, daily INR,  - Rule out MI and trend ECG  - Consider cardiology consult if HR difficult to control despite uptitration of nodal  agents   2. RLE erythema and swelling: Despite him having bilateral PE's, I still think that the  erythematous area looks more like cellulitis than DVT, given it is quite warm, localized to  lower shin, pt has WBC count of 12, and states this is exactly what it looked like when  he had an infection before. He may have both, will treat for both.   - Clindamycin and obtain MRSA nasal swab, if negative can change to MSSA coverage  - RLE Duplex  - Lovenox bridge to Coumadin   3. Bilateral PE's: Possibly the cause of the AFlutter. As noted, pt has no symptoms and  oxygenating well. Lovenox bridge to Coumadin.   4. Diabetes, hyperglycemia - A1c  - Encouarge DM medication complaince - SSI while admitted   5. SW consult for medication access   Stepdown, [facility] team 6 Full code, discussed with pt   Other plans as per orders.  Critical care time: 60 minutes.   Leisel Pinette 07/16/2011, 2:27 AM

## 2011-07-16 NOTE — Progress Notes (Signed)
Utilization Review Completed.Victor Barnes T11/30/2012   

## 2011-07-16 NOTE — Progress Notes (Signed)
ANTICOAGULATION CONSULT NOTE - Initial Consult  Pharmacy Consult for Coumadin Indication: new bilateral PEs, A. Flutter, hx DVTs  No Known Allergies  Patient Measurements: Height: 6' (182.9 cm) Weight: 334 lb (151.501 kg) IBW/kg (Calculated) : 77.6   Vital Signs: Temp: 98.7 F (37.1 C) (11/29 2040) Temp src: Oral (11/29 2040) BP: 135/65 mmHg (11/30 0415) Pulse Rate: 81  (11/30 0415)  Labs:  Basename 07/15/11 2319  HGB 14.2  HCT 41.0  PLT 192  APTT 28  LABPROT 14.5  INR 1.11  HEPARINUNFRC --  CREATININE 0.84  CKTOTAL --  CKMB --  TROPONINI --   Estimated Creatinine Clearance: 150.7 ml/min (by C-G formula based on Cr of 0.84).  Medical History: Past Medical History  Diagnosis Date  . Hypertension   . Sleep apnea   . Venous stasis   . DVT (deep venous thrombosis)     Recurrent RLE DVT, supposed to be on Coumadin but h/o non-compliance   . Cellulitis   . Atrial fibrillation or flutter     Should be on Coumadin but h/o non-compliance; started Sotalol 08/2009  . Diabetes mellitus     Medications:  Scheduled:    . sodium chloride   Intravenous Once  . clindamycin      . clindamycin (CLEOCIN) IV  600 mg Intravenous Once  . clindamycin (CLEOCIN) IV  600 mg Intravenous Q8H  . dextrose      . diltiazem  10 mg Intravenous Once  . diltiazem  60 mg Oral To Minor  . docusate sodium  100 mg Oral BID  . enoxaparin (LOVENOX) injection  1 mg/kg Subcutaneous Q12H  . insulin aspart  0-9 Units Subcutaneous TID WC  . senna  1 tablet Oral BID  . sodium chloride  1,000 mL Intravenous Once  . sodium chloride  3 mL Intravenous Q12H  . DISCONTD: clindamycin (CLEOCIN) IV  600 mg Intravenous Once  . DISCONTD: diltiazem  90 mg Oral Q12H  . DISCONTD: diltiazem  60 mg Oral Q8H  . DISCONTD: enoxaparin  150 mg Subcutaneous Once    Assessment: 55 yo M with previous Coumadin therapy for hx DVTs, presents to ED with LE swelling and pain and SOB/DOE.  Pt found to have bilateral PEs.   Noted previous noncompliance with Coumadin as evidenced by INR 1.11.  Physician has initiated Enoxaparin 1 mg/kg SQ Q12h for bridging.    Goal of Therapy:  INR 2-3   Plan:  Pt reports prior Coumadin dose of 4mg  daily.  Given age and weight will give Coumadin 10mg  x 1 tonight.  Follow-up with INR daily. Continue Lovenox until INR therapeutic.   Victor Barnes, Judie Bonus 07/16/2011,4:44 AM

## 2011-07-16 NOTE — Progress Notes (Signed)
The patient is seen in followup. His admission history and physical and full database are reviewed in detail.  Please see orders for changes in treatment plan.

## 2011-07-16 NOTE — ED Notes (Signed)
Admitting MD at bedside, pt cardizem increased to 10mg /hr per Dr. Kaylyn Layer.

## 2011-07-16 NOTE — ED Provider Notes (Signed)
Medical screening examination/treatment/procedure(s) were conducted as a shared visit with non-physician practitioner(s) and myself.  I personally evaluated the patient during the encounter  Pt was seen and examined- RLE with erythema and swelling in this patient with hx of DVT- not compliant with coumadin.  Also afib/flutter with RVR- on started on diltiazem drip for rate control.    CRITICAL CARE Performed by: Ethelda Chick   Total critical care time: 35  Critical care time was exclusive of separately billable procedures and treating other patients.  Critical care was necessary to treat or prevent imminent or life-threatening deterioration.  Critical care was time spent personally by me on the following activities: development of treatment plan with patient and/or surrogate as well as nursing, discussions with consultants, evaluation of patient's response to treatment, examination of patient, obtaining history from patient or surrogate, ordering and performing treatments and interventions, ordering and review of laboratory studies, ordering and review of radiographic studies, pulse oximetry and re-evaluation of patient's condition.  Ethelda Chick, MD 07/16/11 [phone]

## 2011-07-16 NOTE — ED Notes (Signed)
Pt returned from testing at this time, will assume care of pt now. Plan of care is updated with verbal understanding, pt has inpt bed assignment and awaiting transport. Pt denies any chest pain, reports pain to right foot 3/10 "aggrevating". Pt INAD, skin w/d, and resp e/u.

## 2011-07-16 NOTE — Progress Notes (Signed)
Clinical social worker received inappropriate referral for medication assistance. CSW informed RN Case manager of referall. CSW reviewed pt chart, no further csw needs. Signing off.   Catha Gosselin, Theresia Majors  [phone] .07/16/2011 11:40am

## 2011-07-16 NOTE — Progress Notes (Signed)
Inpatient Diabetes Program Recommendations  AACE/ADA: New Consensus Statement on Inpatient Glycemic Control (2009)  Target Ranges:  Prepandial:   less than 140 mg/dL      Peak postprandial:   less than 180 mg/dL (1-2 hours)      Critically ill patients:  140 - 180 mg/dL   Reason for Visit: Hyperglycemia  Inpatient Diabetes Program Recommendations Insulin - Basal: start basal insulin  Correction (SSI): increase to moderate HgbA1C: =9  Note: If cost is a concern for patient consider using premix 70/30 BID or NPH BID and Regular.   Thank you

## 2011-07-16 NOTE — ED Notes (Signed)
Cardizem increased to 15 mg/hr with verbal from admitting MD at bedside. Pt heart rate is irregular ranging from 101-143. Pt denies any chest pain or shortness of breath, will continue to monitor pt.

## 2011-07-16 NOTE — Progress Notes (Signed)
Inpatient Diabetes Program Recommendations  AACE/ADA: New Consensus Statement on Inpatient Glycemic Control (2009)  Target Ranges:  Prepandial:   less than 140 mg/dL      Peak postprandial:   less than 180 mg/dL (1-2 hours)      Critically ill patients:  140 - 180 mg/dL   Reason for Visit: Consult  Inpatient Diabetes Program Recommendations Insulin - Basal: start basal insulin  Correction (SSI): increase to moderate HgbA1C: =9  Note: Spoke with patient concerning A1C=9 patient said he has been for OP education and does not need to go again. Patient said he just needs to take better care of himself.  Gave him information for free support group and nutrition class.  Pt seems motivated to take better care of his diabetes.   Thank you

## 2011-07-17 LAB — BASIC METABOLIC PANEL
Calcium: 8.6 mg/dL (ref 8.4–10.5)
GFR calc non Af Amer: >90 mL/min (ref >90)
Sodium: 135 mEq/L (ref 135–145)

## 2011-07-17 LAB — CBC
MCH: 28.5 pg (ref 26.0–34.0)
Platelets: 165 10*3/uL (ref 150–400)
RBC: 4.46 MIL/uL (ref 4.22–5.81)
WBC: 8.2 10*3/uL (ref 4.0–10.5)

## 2011-07-17 LAB — GLUCOSE, CAPILLARY
Glucose-Capillary: 289 mg/dL — ABNORMAL HIGH (ref 70–99)
Glucose-Capillary: 192 mg/dL — ABNORMAL HIGH (ref 70–99)

## 2011-07-17 LAB — PROTIME-INR: INR: 1.18 (ref 0.00–1.49)

## 2011-07-17 LAB — CARDIAC PANEL(CRET KIN+CKTOT+MB+TROPI): Relative Index: INVALID (ref 0.0–2.5)

## 2011-07-17 MED ORDER — DILTIAZEM HCL 100 MG IV SOLR: 5.0000 mg/h | INTRAVENOUS | Status: DC

## 2011-07-17 MED ORDER — INSULIN ASPART 100 UNIT/ML ~~LOC~~ SOLN: 0.0000 [IU] | Freq: Every day | SUBCUTANEOUS | Status: DC

## 2011-07-17 MED ORDER — INSULIN ASPART 100 UNIT/ML ~~LOC~~ SOLN
0.0000 [IU] | Freq: Three times a day (TID) | SUBCUTANEOUS | Status: DC
  Administered 2011-07-17: 4 [IU] via SUBCUTANEOUS
  Administered 2011-07-17: 11 [IU] via SUBCUTANEOUS
  Administered 2011-07-18 (×2): 7 [IU] via SUBCUTANEOUS
  Administered 2011-07-18: 4 [IU] via SUBCUTANEOUS
  Administered 2011-07-19: 7 [IU] via SUBCUTANEOUS
  Administered 2011-07-19: 5 [IU] via SUBCUTANEOUS

## 2011-07-17 MED ORDER — DILTIAZEM HCL 60 MG PO TABS
60.0000 mg | ORAL_TABLET | Freq: Four times a day (QID) | ORAL | Status: DC
  Administered 2011-07-17 – 2011-07-19 (×9): 60 mg via ORAL
  Filled 2011-07-17 (×12): qty 1

## 2011-07-17 MED ORDER — LISINOPRIL 10 MG PO TABS
10.0000 mg | ORAL_TABLET | Freq: Every day | ORAL | Status: DC
  Administered 2011-07-17 – 2011-07-19 (×3): 10 mg via ORAL
  Filled 2011-07-17 (×3): qty 1

## 2011-07-17 MED ORDER — INSULIN ASPART 100 UNIT/ML ~~LOC~~ SOLN
3.0000 [IU] | Freq: Three times a day (TID) | SUBCUTANEOUS | Status: DC
  Administered 2011-07-17 – 2011-07-19 (×7): 3 [IU] via SUBCUTANEOUS

## 2011-07-17 MED ORDER — ASPIRIN 81 MG PO CHEW
81.0000 mg | CHEWABLE_TABLET | Freq: Every day | ORAL | Status: DC
  Administered 2011-07-17 – 2011-07-19 (×3): 81 mg via ORAL
  Filled 2011-07-17 (×2): qty 1

## 2011-07-17 MED ORDER — INSULIN GLARGINE 100 UNIT/ML ~~LOC~~ SOLN
15.0000 [IU] | Freq: Every day | SUBCUTANEOUS | Status: DC
  Administered 2011-07-17 – 2011-07-18 (×2): 15 [IU] via SUBCUTANEOUS
  Filled 2011-07-17: qty 3

## 2011-07-17 MED ORDER — LEVOFLOXACIN 500 MG PO TABS
500.0000 mg | ORAL_TABLET | Freq: Every day | ORAL | Status: DC
Start: 2011-07-17 — End: 2011-07-18
  Administered 2011-07-17: 500 mg via ORAL
  Filled 2011-07-17 (×2): qty 1

## 2011-07-17 MED ORDER — WARFARIN SODIUM 10 MG PO TABS
10.0000 mg | ORAL_TABLET | Freq: Once | ORAL | Status: AC
  Administered 2011-07-17: 10 mg via ORAL
  Filled 2011-07-17: qty 1

## 2011-07-17 NOTE — Progress Notes (Signed)
ANTICOAGULATION CONSULT NOTE - Initial Consult  Pharmacy Consult for Coumadin Indication: new bilateral PEs, A. Flutter, hx DVTs  No Known Allergies  Patient Measurements: Height: 6' (182.9 cm) Weight: 332 lb 0.2 oz (150.6 kg) IBW/kg (Calculated) : 77.6   Vital Signs: Temp: 97.6 F (36.4 C) (12/01 1200) Temp src: Oral (12/01 1200) BP: 105/46 mmHg (12/01 1200) Pulse Rate: 71  (12/01 1200)  Labs:  Basename 07/17/11 0700 07/16/11 2335 07/16/11 1655 07/16/11 0855 07/15/11 2319  HGB 12.7* -- -- 13.5 --  HCT 37.6* -- -- 39.1 41.0  PLT 165 -- -- 169 192  APTT -- -- -- -- 28  LABPROT 15.3* -- -- 15.5* 14.5  INR 1.18 -- -- 1.20 1.11  HEPARINUNFRC -- -- -- -- --  CREATININE 0.80 -- -- 0.68 0.84  CKTOTAL -- 89 65 90 --  CKMB -- 2.0 2.6 3.1 --  TROPONINI -- <0.30 <0.30 <0.30 --   Estimated Creatinine Clearance: 157.6 ml/min (by C-G formula based on Cr of 0.8).  Medical History: Past Medical History  Diagnosis Date  . Hypertension   . Sleep apnea   . Venous stasis   . DVT (deep venous thrombosis)     Recurrent RLE DVT, supposed to be on Coumadin but h/o non-compliance   . Cellulitis   . Atrial fibrillation or flutter     Should be on Coumadin but h/o non-compliance; started Sotalol 08/2009  . Diabetes mellitus     Medications:  Scheduled:     . aspirin  81 mg Oral Daily  . clindamycin      . dextrose      . diltiazem  60 mg Oral Q6H  . docusate sodium  100 mg Oral BID  . enoxaparin (LOVENOX) injection  1 mg/kg Subcutaneous Q12H  . insulin aspart  0-20 Units Subcutaneous TID WC  . insulin aspart  0-5 Units Subcutaneous QHS  . insulin aspart  3 Units Subcutaneous TID WC  . insulin glargine  15 Units Subcutaneous QHS  . levofloxacin  500 mg Oral Daily  . lisinopril  10 mg Oral Daily  . senna  1 tablet Oral BID  . sodium chloride  3 mL Intravenous Q12H  . warfarin  10 mg Oral ONCE-1800  . DISCONTD: clindamycin (CLEOCIN) IV  600 mg Intravenous Q8H  . DISCONTD:  insulin aspart  0-9 Units Subcutaneous TID WC  . DISCONTD: levofloxacin (LEVAQUIN) IV  500 mg Intravenous Q24H    Assessment: 55 yo M with previous Coumadin therapy for hx DVTs, presents to ED with LE swelling and pain and SOB/DOE.  Pt found to have bilateral PEs.  Noted previous noncompliance with Coumadin as evidenced by INR 1.11.  Physician has initiated Enoxaparin 1 mg/kg SQ Q12h for bridging.    Goal of Therapy:  INR 2-3   Plan:  Coumadin 10mg  x 1 tonight.   Follow-up with INR daily.  Continue Lovenox until INR therapeutic.   Nadara [place] 07/17/2011,12:04 PM

## 2011-07-17 NOTE — Progress Notes (Signed)
Patient transferred to unit via wheelchair. VSS. A/O, denies pain. Victor Barnes [facility]

## 2011-07-17 NOTE — Progress Notes (Signed)
Subjective: 55yoM with h/o Aflutter, obesity, DM2, h/o RLE infection necessitating surgical  management and h/o recurrent RLE DVT who is supposed to be on chronic Coumadin but has not been recently compliant due to monetary difficulty, presents with RLE erythema and  pain, and found to be in AFlutter with RVR, now with CTA showing bilateral PE's.   This morning the patient is without complaints at all.  Victor Barnes denies fevers chills nausea vomiting chest pain or shortness of breath. Victor Barnes does report that his right lower stream and Victor Barnes remains more swollen than typically.  Objective: Weight change: -0.902 kg (-1 lb 15.8 oz)  Intake/Output Summary (Last 24 hours) at 07/17/11 1027 Last data filed at 07/17/11 9562  Gross per 24 hour  Intake   1980 ml  Output   1800 ml  Net    180 ml   Blood pressure 124/73, pulse 70, temperature 97.9 F (36.6 C), temperature source Oral, resp. rate 11, height 6' (1.829 m), weight 150.6 kg (332 lb 0.2 oz), SpO2 99.00%. Temp:  [97.4 F (36.3 C)-98.2 F (36.8 C)] 97.9 F (36.6 C) (12/01 0809) Pulse Rate:  [59-76] 70  (12/01 0809) Resp:  [7-23] 11  (12/01 0809) BP: (108-157)/(41-80) 124/73 mmHg (12/01 0809) SpO2:  [96 %-99 %] 99 % (12/01 0809) Weight:  [150.6 kg (332 lb 0.2 oz)] 332 lb 0.2 oz (150.6 kg) (12/01 0432)  Physical Exam: General: No acute respiratory distress Lungs: Clear to auscultation bilaterally without wheezes or crackles Cardiovascular: Regular rate and rhythm without murmur gallop or rub normal S1 and S2 Abdomen: Nontender, nondistended, soft, bowel sounds positive, no rebound, no ascites, no appreciable mass, obese Extremities: No significant cyanosis or clubbing, there is 2+ edema of bilateral lower extremities with hemosiderin staining consistent with chronic venous stasis-the erythema of the right lower extremity appears to have resolved  Lab Results:  [facility] 07/17/11 0700 07/16/11 0855 07/15/11 2319  NA 135 135 136  K 4.1 3.6 3.6  CL  101 101 99  CO2 28 23 25   GLUCOSE 211* 253* 259*  BUN 9 10 12   CREATININE 0.80 0.68 0.84  CALCIUM 8.6 8.4 9.2  MG -- -- --  PHOS -- -- --    Basename 07/15/11 2319  AST 20  ALT 31  ALKPHOS 93  BILITOT 0.5  PROT 7.3  ALBUMIN 3.7   No results found for this basename: LIPASE:2,AMYLASE:2 in the last 72 hours  Basename 07/17/11 0700 07/16/11 0855 07/15/11 2319  WBC 8.2 10.5 12.4*  NEUTROABS -- -- 7.7  HGB 12.7* 13.5 14.2  HCT 37.6* 39.1 41.0  MCV 84.3 83.4 83.8  PLT 165 169 192    Basename 07/16/11 2335 07/16/11 1655 07/16/11 0855  CKTOTAL 89 65 90  CKMB 2.0 2.6 3.1  CKMBINDEX -- -- --  TROPONINI <0.30 <0.30 <0.30   No results found for this basename: POCBNP:3 in the last 72 hours No results found for this basename: DDIMER:2 in the last 72 hours  Basename 07/16/11 0855  HGBA1C 9.0*   No results found for this basename: CHOL:2,HDL:2,LDLCALC:2,TRIG:2,CHOLHDL:2,LDLDIRECT:2 in the last 72 hours No results found for this basename: TSH,T4TOTAL,FREET3,T3FREE,THYROIDAB in the last 72 hours No results found for this basename: VITAMINB12:2,FOLATE:2,FERRITIN:2,TIBC:2,IRON:2,RETICCTPCT:2 in the last 72 hours  Micro Results: Recent Results (from the past 240 hour(s))  CULTURE, BLOOD (ROUTINE X 2)     Status: Normal (Preliminary result)   Collection Time   07/15/11 11:24 PM      Component Value Range Status Comment  Specimen Description BLOOD RIGHT ARM   Final    Special Requests     Final    Value: BOTTLES DRAWN AEROBIC AND ANAEROBIC 10CC AER, 5CC ANA   Setup Time [id number]   Final    Culture     Final    Value:        BLOOD CULTURE RECEIVED NO GROWTH TO DATE CULTURE WILL BE HELD FOR 5 DAYS BEFORE ISSUING A FINAL NEGATIVE REPORT   Report Status PENDING   Incomplete   CULTURE, BLOOD (ROUTINE X 2)     Status: Normal (Preliminary result)   Collection Time   07/15/11 11:30 PM      Component Value Range Status Comment   Specimen Description BLOOD RIGHT HAND   Final     Special Requests     Final    Value: BOTTLES DRAWN AEROBIC AND ANAEROBIC 10CC AER,7CC ANA   Setup Time [id number]   Final    Culture     Final    Value:        BLOOD CULTURE RECEIVED NO GROWTH TO DATE CULTURE WILL BE HELD FOR 5 DAYS BEFORE ISSUING A FINAL NEGATIVE REPORT   Report Status PENDING   Incomplete   MRSA PCR SCREENING     Status: Normal   Collection Time   07/16/11  3:44 AM      Component Value Range Status Comment   MRSA by PCR NEGATIVE  NEGATIVE  Final     Studies/Results: Scheduled Meds:   . clindamycin      . dextrose      . docusate sodium  100 mg Oral BID  . enoxaparin (LOVENOX) injection  1 mg/kg Subcutaneous Q12H  . insulin aspart  0-9 Units Subcutaneous TID WC  . levofloxacin (LEVAQUIN) IV  500 mg Intravenous Q24H  . senna  1 tablet Oral BID  . sodium chloride  3 mL Intravenous Q12H  . warfarin  10 mg Oral ONCE-1800  . DISCONTD: clindamycin (CLEOCIN) IV  600 mg Intravenous Q8H   Continuous Infusions:   . diltiazem (CARDIZEM) infusion 15 mg/hr (07/17/11 0800)   PRN Meds:.sodium chloride, acetaminophen, acetaminophen, ondansetron (ZOFRAN) IV, ondansetron, sodium chloride, zolpidem  Assessment/Plan:  Bilateral PE's with suspected right lower extremity DVT: Continue full dose anticoagulation utilizing Lovenox until INR is therapeutic-patient has been educated on the extreme importance of compliance with Coumadin  AFlutter: The patient's acute exacerbation of his intermittent atrial flutter is likely a result of his PEs as well as medication noncompliance-Victor Barnes is now well controlled- we will wean him to oral medications and off of the Cardizem drip and then transfer him to telemetry-full anticoagulation as indicated above  RLE erythema and swelling: The patient has chronic venous stasis changes bilaterally and a prior history of ulcerations in the right lower extremity-I am less convinced at this time that Victor Barnes has an active cellulitis-we will complete a five-day  course of antibiotics empirically-I have broadened his coverage using Levaquin given that Victor Barnes is a diabetic and needs polymicrobial coverage-likewise Victor Barnes needs to continue his anticoagulation religiously to prevent further DVT formation  Diabetes, hyperglycemia  Poorly controlled both here and at home with elevated A1c-adjust current treatment-continue to educate on extreme importance of ongoing comply  Obesity:  The patient has been educated on the weight between his obesity and his propensity for DVTs as well as his lower extremity swelling-Victor Barnes has been advised to follow an appropriate weight loss strategy   LOS: 2 days   Victor Barnes  07/17/2011, 10:27 AM

## 2011-07-17 NOTE — Progress Notes (Signed)
Pt up to BR and tolerated well.  Cardizem gtt off.  VS stable

## 2011-07-18 DIAGNOSIS — M79609 Pain in unspecified limb: Secondary | ICD-10-CM

## 2011-07-18 LAB — BASIC METABOLIC PANEL
BUN: 9 mg/dL (ref 6–23)
Calcium: 8.6 mg/dL (ref 8.4–10.5)
Chloride: 99 mEq/L (ref 96–112)
Creatinine, Ser: 0.65 mg/dL (ref 0.50–1.35)
GFR calc Af Amer: >90 mL/min (ref >90)

## 2011-07-18 LAB — CBC
HCT: 39.3 % (ref 39.0–52.0)
MCH: 28.5 pg (ref 26.0–34.0)
MCHC: 34.1 g/dL (ref 30.0–36.0)
MCV: 83.6 fL (ref 78.0–100.0)
RDW: 13.1 % (ref 11.5–15.5)

## 2011-07-18 LAB — GLUCOSE, CAPILLARY
Glucose-Capillary: 182 mg/dL — ABNORMAL HIGH (ref 70–99)
Glucose-Capillary: 242 mg/dL — ABNORMAL HIGH (ref 70–99)
Glucose-Capillary: 189 mg/dL — ABNORMAL HIGH (ref 70–99)

## 2011-07-18 MED ORDER — FUROSEMIDE 10 MG/ML IJ SOLN
40.0000 mg | Freq: Once | INTRAMUSCULAR | Status: AC
  Administered 2011-07-18: 40 mg via INTRAVENOUS
  Filled 2011-07-18: qty 4

## 2011-07-18 MED ORDER — RIVAROXABAN 15 MG PO TABS
15.0000 mg | ORAL_TABLET | Freq: Two times a day (BID) | ORAL | Status: DC
  Administered 2011-07-18 – 2011-07-19 (×3): 15 mg via ORAL
  Filled 2011-07-18 (×4): qty 1

## 2011-07-18 NOTE — Progress Notes (Signed)
PRELIMINARY RESULTS* Victor Barnes Right lower extremity venous duplex doppler. No obvious evidence of DVT, superficial thrombus, or Baker's Cyst. Ankle area was difficult to visualize due to edema.   07/18/2011, 11:11 AM

## 2011-07-18 NOTE — Progress Notes (Signed)
Subjective: 55yoM with h/o Aflutter, obesity, DM2, h/o RLE infection necessitating surgical  management and h/o recurrent RLE DVT who is supposed to be on chronic Coumadin but has not been recently compliant due to monetary difficulty, presents with RLE erythema and  pain, and found to be in AFlutter with RVR, now with CTA showing bilateral PE's.   TPatient is c/o swollen right leg   Objective: Weight change: 2.807 kg (6 lb 3 oz)  Intake/Output Summary (Last 24 hours) at 07/18/11 1742 Last data filed at 07/17/11 1800  Gross per 24 hour  Intake    240 ml  Output      0 ml  Net    240 ml   Blood pressure 118/82, pulse 66, temperature 98 F (36.7 C), temperature source Oral, resp. rate 18, height 6' (1.829 m), weight 151.5 kg (334 lb), SpO2 98.00%. Temp:  [98 F (36.7 C)] 98 F (36.7 C) (12/02 0500) Pulse Rate:  [66-75] 66  (12/02 0500) Resp:  [18] 18  (12/02 0500) BP: (101-124)/(55-82) 118/82 mmHg (12/02 1114) SpO2:  [98 %] 98 % (12/02 0500) Weight:  [151.5 kg (334 lb)-153.407 kg (338 lb 3.2 oz)] 334 lb (151.5 kg) (12/02 0500)  Physical Exam: General: No acute respiratory distress Lungs: Clear to auscultation bilaterally without wheezes or crackles Cardiovascular: Regular rate and rhythm without murmur gallop or rub normal S1 and S2 Abdomen: Nontender, nondistended, soft, bowel sounds positive, no rebound, no ascites, no appreciable mass, obese Extremities: edema of the right calf  with hemosiderin staining consistent with chronic venous stasis-  Lab Results:  Basename 07/18/11 0500 07/17/11 0700 07/16/11 0855  NA 132* 135 135  K 3.6 4.1 3.6  CL 99 101 101  CO2 22 28 23   GLUCOSE 204* 211* 253*  BUN 9 9 10   CREATININE 0.65 0.80 0.68  CALCIUM 8.6 8.6 8.4  MG -- -- --  PHOS -- -- --    Basename 07/15/11 2319  AST 20  ALT 31  ALKPHOS 93  BILITOT 0.5  PROT 7.3  ALBUMIN 3.7   No results found for this basename: LIPASE:2,AMYLASE:2 in the last 72 hours  Basename  07/18/11 0500 07/17/11 0700 07/16/11 0855 07/15/11 2319  WBC 9.7 8.2 10.5 --  NEUTROABS -- -- -- 7.7  HGB 13.4 12.7* 13.5 --  HCT 39.3 37.6* 39.1 --  MCV 83.6 84.3 83.4 --  PLT 184 165 169 --    Basename 07/16/11 2335 07/16/11 1655 07/16/11 0855  CKTOTAL 89 65 90  CKMB 2.0 2.6 3.1  CKMBINDEX -- -- --  TROPONINI <0.30 <0.30 <0.30   No results found for this basename: POCBNP:3 in the last 72 hours No results found for this basename: DDIMER:2 in the last 72 hours  Basename 07/16/11 0855  HGBA1C 9.0*   No results found for this basename: CHOL:2,HDL:2,LDLCALC:2,TRIG:2,CHOLHDL:2,LDLDIRECT:2 in the last 72 hours No results found for this basename: TSH,T4TOTAL,FREET3,T3FREE,THYROIDAB in the last 72 hours No results found for this basename: VITAMINB12:2,FOLATE:2,FERRITIN:2,TIBC:2,IRON:2,RETICCTPCT:2 in the last 72 hours  Micro Results: Recent Results (from the past 240 hour(s))  CULTURE, BLOOD (ROUTINE X 2)     Status: Normal (Preliminary result)   Collection Time   07/15/11 11:24 PM      Component Value Range Status Comment   Specimen Description BLOOD RIGHT ARM   Final    Special Requests     Final    Value: BOTTLES DRAWN AEROBIC AND ANAEROBIC 10CC AER, 5CC ANA   Setup Time [id number]   Final  Culture     Final    Value:        BLOOD CULTURE RECEIVED NO GROWTH TO DATE CULTURE WILL BE HELD FOR 5 DAYS BEFORE ISSUING A FINAL NEGATIVE REPORT   Report Status PENDING   Incomplete   CULTURE, BLOOD (ROUTINE X 2)     Status: Normal (Preliminary result)   Collection Time   07/15/11 11:30 PM      Component Value Range Status Comment   Specimen Description BLOOD RIGHT HAND   Final    Special Requests     Final    Value: BOTTLES DRAWN AEROBIC AND ANAEROBIC 10CC AER,7CC ANA   Setup Time [id number]   Final    Culture     Final    Value:        BLOOD CULTURE RECEIVED NO GROWTH TO DATE CULTURE WILL BE HELD FOR 5 DAYS BEFORE ISSUING A FINAL NEGATIVE REPORT   Report Status PENDING    Incomplete   MRSA PCR SCREENING     Status: Normal   Collection Time   07/16/11  3:44 AM      Component Value Range Status Comment   MRSA by PCR NEGATIVE  NEGATIVE  Final     Studies/Results: Scheduled Meds:    . aspirin  81 mg Oral Daily  . diltiazem  60 mg Oral Q6H  . docusate sodium  100 mg Oral BID  . furosemide  40 mg Intravenous Once  . insulin aspart  0-20 Units Subcutaneous TID WC  . insulin aspart  0-5 Units Subcutaneous QHS  . insulin aspart  3 Units Subcutaneous TID WC  . insulin glargine  15 Units Subcutaneous QHS  . lisinopril  10 mg Oral Daily  . rivaroxaban  15 mg Oral BID  . senna  1 tablet Oral BID  . DISCONTD: enoxaparin (LOVENOX) injection  1 mg/kg Subcutaneous Q12H  . DISCONTD: levofloxacin  500 mg Oral Daily  . DISCONTD: sodium chloride  3 mL Intravenous Q12H   Continuous Infusions:  PRN Meds:.acetaminophen, acetaminophen, ondansetron (ZOFRAN) IV, ondansetron, zolpidem, DISCONTD: sodium chloride, DISCONTD: sodium chloride  Assessment/Plan:  Bilateral PE's with suspected right lower extremity DVT: change to Xarelto po . Better drug than coumadin    AFlutter: rate control. full anticoagulation as indicated above  RLE erythema and swelling: The patient has chronic venous stasis changes bilaterally and a prior history of ulcerations in the right lower extremity-I am less convinced at this time that he has an active cellulitis. - Await venous duplex Elevate leg and start compression stockings. he needs to continue his anticoagulation religiously to prevent further DVT formation  Diabetes, hyperglycemia  Poorly controlled both here and at home with elevated A1c-adjust current treatment-continue to educate on extreme importance of ongoing comply  Obesity:  The patient has been educated on the weight between his obesity and his propensity for DVTs as well as his lower extremity swelling-he has been advised to follow an appropriate weight loss strategy    LOS: 3 days   Koraima Albertsen 07/18/2011, 5:42 PM

## 2011-07-18 NOTE — Progress Notes (Signed)
Patient interviewed. States he has drug coverage but was having some financial hardships that limited which meds he could afford. States he can get meds if they have a  low copay. Placed Xarelto 5 day free supply and copay assistance card on chart for pt at d/c. MD please write 5 day supply Rx for trial med if pt is d/c on Xarelto. Will give information to pt about Rx Outreach program that can provide Coumadin at a discount price. The program is based on income. Will instruct him to discuss with his physician if he meets guidelines. Will continue to follow until d/c.

## 2011-07-19 DIAGNOSIS — I87009 Postthrombotic syndrome without complications of unspecified extremity: Secondary | ICD-10-CM | POA: Diagnosis present

## 2011-07-19 DIAGNOSIS — I2699 Other pulmonary embolism without acute cor pulmonale: Secondary | ICD-10-CM | POA: Diagnosis present

## 2011-07-19 LAB — GLUCOSE, CAPILLARY: Glucose-Capillary: 184 mg/dL — ABNORMAL HIGH (ref 70–99)

## 2011-07-19 MED ORDER — LISINOPRIL-HYDROCHLOROTHIAZIDE 20-12.5 MG PO TABS: 1.0000 | ORAL_TABLET | Freq: Every day | ORAL | Status: DC

## 2011-07-19 MED ORDER — METFORMIN HCL 1000 MG PO TABS: 1000.0000 mg | ORAL_TABLET | Freq: Two times a day (BID) | ORAL | Status: DC

## 2011-07-19 MED ORDER — RIVAROXABAN 15 MG PO TABS: 15.0000 mg | ORAL_TABLET | Freq: Two times a day (BID) | ORAL | Status: DC

## 2011-07-19 NOTE — Discharge Summary (Signed)
Physician Discharge Summary  Patient ID: Victor Barnes MRN: [id number] DOB/AGE: [birth year] 55 y.o. Primary Care Physician:WHITE,CYNTHIA S, MD Admit date: 07/15/2011 Discharge date: 07/19/2011    Discharge Diagnoses:  PE (pulmonary embolism)  Atrial fibrillation or flutter  Hypertension  Diabetes mellitus  Venous stasis dermatitis Hx of  DVT (deep venous thrombosis)  Post-thrombotic syndrome   Current Discharge Medication List    START taking these medications   Details  lisinopril-hydrochlorothiazide (ZESTORETIC) 20-12.5 MG per tablet Take 1 tablet by mouth daily. Qty: 30 tablet, Refills: 0    metFORMIN (GLUCOPHAGE) 1000 MG tablet Take 1 tablet (1,000 mg total) by mouth 2 (two) times daily with a meal. Qty: 60 tablet, Refills: 0    Rivaroxaban (XARELTO) 15 MG TABS tablet Take 1 tablet (15 mg total) by mouth 2 (two) times daily. Qty: 40 tablet, Refills: 0        Discharged Condition: good   Consults: none Significant Diagnostic Studies: Dg Chest 2 View  07/16/2011  *RADIOLOGY REPORT*  Clinical Data: Shortness of breath and palpitations; right leg swelling and pain.  CHEST - 2 VIEW  Comparison: Chest radiograph performed 06/16/2010  Findings: The lungs are well-aerated.  Mild stable vascular congestion is noted.  There is no evidence of focal opacification, pleural effusion or pneumothorax.  The heart is normal in size; the mediastinal contour is within normal limits.  No acute osseous abnormalities are seen.  There is mild chronic anterior wedging at the lower thoracic spine.  IMPRESSION: Mild stable vascular congestion noted; lungs remain grossly clear.  Original Report Authenticated By: Tonia Ghent, M.D.   Ct Angio Chest W/cm &/or Wo Cm  07/16/2011  *RADIOLOGY REPORT*  Clinical Data:  Shortness of breath and leg swelling.  CT ANGIOGRAPHY CHEST WITH CONTRAST  Technique:  Multidetector CT imaging of the chest was performed using the standard protocol during bolus  administration of intravenous contrast.  Multiplanar CT image reconstructions including MIPs were obtained to evaluate the vascular anatomy.  The study was repeated, as the first attempted study was stopped early due to scanner malfunction.  Contrast: OMNIPAQUE IOHEXOL 350 MG/ML IV SOLN  Comparison:  Chest radiograph performed earlier today at 12:11 a.m.  Findings:  There is segmental pulmonary embolus to the right lower lobe and left upper lobe, and subsegmental pulmonary embolus to the left lower lobe.  Minimal bilateral central airspace opacity is noted, likely reflecting atelectasis.  There is no evidence of pleural effusion or pneumothorax.  No masses are identified; no abnormal focal contrast enhancement is seen.  There is no evidence of mediastinal lymphadenopathy.  Scattered subcentimeter mediastinal nodes remain within normal limits.  No pericardial effusion is seen.  The great vessels are unremarkable in appearance.  No axillary lymphadenopathy is seen. There is a 1.1 cm peripherally calcified lesion within the left thyroid lobe; thyroid ultrasound could be considered for further evaluation.  The visualized portions of the liver and spleen are unremarkable.  No acute osseous abnormalities are seen.  Review of the MIP images confirms the above findings.  IMPRESSION:  1.  Segmental pulmonary embolus to the right lower lobe and left upper lobe, and subsegmental pulmonary embolus to the left lower lobe. 2.  Minimal bilateral central airspace opacity, likely reflecting atelectasis. 3.  1.1 cm peripherally calcified lesion within the left thyroid lobe; thyroid ultrasound could be considered for further evaluation, when and as deemed clinically appropriate.  Critical Value/emergent results were called by telephone at the time of interpretation on 07/16/2011  at 03:40 a.m.  to  Dr. Roselie Awkward, who verbally acknowledged these results.  Original Report Authenticated By: Tonia Ghent, M.D.    Lab  Results: Basic Metabolic Panel:  Basename 07/18/11 0500 07/17/11 0700  NA 132* 135  K 3.6 4.1  CL 99 101  CO2 22 28  GLUCOSE 204* 211*  BUN 9 9  CREATININE 0.65 0.80  CALCIUM 8.6 8.6  MG -- --  PHOS -- --    CBC:  Basename 07/18/11 0500 07/17/11 0700  WBC 9.7 8.2  NEUTROABS -- --  HGB 13.4 12.7*  HCT 39.3 37.6*  MCV 83.6 84.3  PLT 184 165    Recent Results (from the past 240 hour(s))  CULTURE, BLOOD (ROUTINE X 2)     Status: Normal (Preliminary result)   Collection Time   07/15/11 11:24 PM      Component Value Range Status Comment   Specimen Description BLOOD RIGHT ARM   Final    Special Requests     Final    Value: BOTTLES DRAWN AEROBIC AND ANAEROBIC 10CC AER, 5CC ANA   Setup Time [id number]   Final    Culture     Final    Value:        BLOOD CULTURE RECEIVED NO GROWTH TO DATE CULTURE WILL BE HELD FOR 5 DAYS BEFORE ISSUING A FINAL NEGATIVE REPORT   Report Status PENDING   Incomplete   CULTURE, BLOOD (ROUTINE X 2)     Status: Normal (Preliminary result)   Collection Time   07/15/11 11:30 PM      Component Value Range Status Comment   Specimen Description BLOOD RIGHT HAND   Final    Special Requests     Final    Value: BOTTLES DRAWN AEROBIC AND ANAEROBIC 10CC AER,7CC ANA   Setup Time [id number]   Final    Culture     Final    Value:        BLOOD CULTURE RECEIVED NO GROWTH TO DATE CULTURE WILL BE HELD FOR 5 DAYS BEFORE ISSUING A FINAL NEGATIVE REPORT   Report Status PENDING   Incomplete   MRSA PCR SCREENING     Status: Normal   Collection Time   07/16/11  3:44 AM      Component Value Range Status Comment   MRSA by PCR NEGATIVE  NEGATIVE  Final      Hospital Course:  55 yo man with history of atrial flutter, recurrent DVTs, obesity, DM, who presented to the ED on 07/16/11 with increase edema of the right leg. It was also noted that he was in afib with rvr. Patient has been off his meds due to financial problems. He was admitted and started on iv Cardizem and  full dose anticoagulation. He had a CTA chest which showed a small segmental PE. Patient was transition to Xarelto. He did not have a new DVT. His right leg has changes c/w moderate - severe post-thrombotic syndrome. I have recommended lifelong anticoagulation, compression stockings and elevation of the right leg at all times.    Discharge Exam: Blood pressure 133/76, pulse 71, temperature 97.7 F (36.5 C), temperature source Oral, resp. rate 18, height 6' (1.829 m), weight 150 kg (330 lb 11 oz), SpO2 100.00%. Alert and oriented CVS: regular RS: CTAB Abdomen soft, NT R LE : hyperpigmenetd skin, old scar, extreme edema.   Disposition: home  Discharge Orders    Future Orders Please Complete By Expires   Diet Carb Modified  Increase activity slowly         Follow-up Information    Follow up with WHITE,CYNTHIA S. Make an appointment in 1 week.   Contact information:   [address] [employer], [place]. [place] [address] [phone]          Signed: Lonia Blood 07/19/2011, 12:52 PM

## 2011-07-22 LAB — CULTURE, BLOOD (ROUTINE X 2): Culture: NO GROWTH

## 2011-07-26 ENCOUNTER — Encounter: Payer: Self-pay | Admitting: Internal Medicine

## 2012-12-11 ENCOUNTER — Encounter (HOSPITAL_COMMUNITY): Payer: Self-pay | Admitting: Adult Health

## 2012-12-11 ENCOUNTER — Inpatient Hospital Stay (HOSPITAL_COMMUNITY)
Admission: EM | Admit: 2012-12-11 | Discharge: 2012-12-14 | DRG: 176 | Disposition: A | Discharge status: Home / Self Care | Payer: 59 | Attending: Internal Medicine | Admitting: Internal Medicine

## 2012-12-11 ENCOUNTER — Emergency Department (HOSPITAL_COMMUNITY): Payer: 59

## 2012-12-11 DIAGNOSIS — R000 Tachycardia, unspecified: Secondary | ICD-10-CM | POA: Diagnosis present

## 2012-12-11 DIAGNOSIS — Z91199 Patient's noncompliance with other medical treatment and regimen due to unspecified reason: Secondary | ICD-10-CM

## 2012-12-11 DIAGNOSIS — E119 Type 2 diabetes mellitus without complications: Secondary | ICD-10-CM

## 2012-12-11 DIAGNOSIS — Z794 Long term (current) use of insulin: Secondary | ICD-10-CM

## 2012-12-11 DIAGNOSIS — E041 Nontoxic single thyroid nodule: Secondary | ICD-10-CM | POA: Diagnosis present

## 2012-12-11 DIAGNOSIS — I82402 Acute embolism and thrombosis of unspecified deep veins of left lower extremity: Secondary | ICD-10-CM

## 2012-12-11 DIAGNOSIS — IMO0002 Reserved for concepts with insufficient information to code with codable children: Secondary | ICD-10-CM

## 2012-12-11 DIAGNOSIS — M7989 Other specified soft tissue disorders: Secondary | ICD-10-CM | POA: Diagnosis present

## 2012-12-11 DIAGNOSIS — Z9114 Patient's other noncompliance with medication regimen: Secondary | ICD-10-CM

## 2012-12-11 DIAGNOSIS — G4730 Sleep apnea, unspecified: Secondary | ICD-10-CM

## 2012-12-11 DIAGNOSIS — Z9119 Patient's noncompliance with other medical treatment and regimen: Secondary | ICD-10-CM

## 2012-12-11 DIAGNOSIS — Z91148 Patient's other noncompliance with medication regimen for other reason: Secondary | ICD-10-CM

## 2012-12-11 DIAGNOSIS — L0390 Cellulitis, unspecified: Secondary | ICD-10-CM

## 2012-12-11 DIAGNOSIS — I2699 Other pulmonary embolism without acute cor pulmonale: Principal | ICD-10-CM

## 2012-12-11 DIAGNOSIS — I87009 Postthrombotic syndrome without complications of unspecified extremity: Secondary | ICD-10-CM

## 2012-12-11 DIAGNOSIS — I10 Essential (primary) hypertension: Secondary | ICD-10-CM

## 2012-12-11 DIAGNOSIS — Z86718 Personal history of other venous thrombosis and embolism: Secondary | ICD-10-CM

## 2012-12-11 DIAGNOSIS — I4891 Unspecified atrial fibrillation: Secondary | ICD-10-CM

## 2012-12-11 DIAGNOSIS — I4892 Unspecified atrial flutter: Secondary | ICD-10-CM | POA: Diagnosis present

## 2012-12-11 DIAGNOSIS — I82409 Acute embolism and thrombosis of unspecified deep veins of unspecified lower extremity: Secondary | ICD-10-CM | POA: Diagnosis present

## 2012-12-11 DIAGNOSIS — I878 Other specified disorders of veins: Secondary | ICD-10-CM

## 2012-12-11 HISTORY — DX: Other pulmonary embolism without acute cor pulmonale: I26.99

## 2012-12-11 LAB — BASIC METABOLIC PANEL
BUN: 8 mg/dL (ref 6–23)
CO2: 28 mEq/L (ref 19–32)
Chloride: 98 mEq/L (ref 96–112)
Creatinine, Ser: 0.81 mg/dL (ref 0.50–1.35)
GFR calc Af Amer: >90 mL/min (ref >90)
GFR calc non Af Amer: >90 mL/min (ref >90)
Glucose, Bld: 320 mg/dL — ABNORMAL HIGH (ref 70–99)
Potassium: 3.9 mEq/L (ref 3.5–5.1)
Sodium: 135 mEq/L (ref 135–145)

## 2012-12-11 LAB — GLUCOSE, CAPILLARY: Glucose-Capillary: 262 mg/dL — ABNORMAL HIGH (ref 70–99)

## 2012-12-11 LAB — CBC
Platelets: 205 10*3/uL (ref 150–400)
RBC: 5.27 MIL/uL (ref 4.22–5.81)
WBC: 13.6 10*3/uL — ABNORMAL HIGH (ref 4.0–10.5)

## 2012-12-11 LAB — HEMOGLOBIN A1C: Hgb A1c MFr Bld: 11.1 % — ABNORMAL HIGH (ref <5.7)

## 2012-12-11 LAB — TROPONIN I: Troponin I: <0.3 ng/mL (ref <0.30)

## 2012-12-11 LAB — HEPATIC FUNCTION PANEL
AST: 10 U/L (ref 0–37)
Albumin: 3.3 g/dL — ABNORMAL LOW (ref 3.5–5.2)
Alkaline Phosphatase: 98 U/L (ref 39–117)
Total Bilirubin: 0.6 mg/dL (ref 0.3–1.2)
Total Protein: 7 g/dL (ref 6.0–8.3)

## 2012-12-11 MED ORDER — ACETAMINOPHEN 325 MG PO TABS: 650.0000 mg | ORAL_TABLET | Freq: Four times a day (QID) | ORAL | Status: DC | PRN

## 2012-12-11 MED ORDER — ONDANSETRON HCL 4 MG/2ML IJ SOLN: 4.0000 mg | Freq: Four times a day (QID) | INTRAMUSCULAR | Status: DC | PRN

## 2012-12-11 MED ORDER — ACETAMINOPHEN 650 MG RE SUPP: 650.0000 mg | Freq: Four times a day (QID) | RECTAL | Status: DC | PRN

## 2012-12-11 MED ORDER — SODIUM CHLORIDE 0.9 % IV SOLN
INTRAVENOUS | Status: DC
  Administered 2012-12-11 – 2012-12-12 (×2): via INTRAVENOUS

## 2012-12-11 MED ORDER — ENOXAPARIN SODIUM 150 MG/ML ~~LOC~~ SOLN
1.0000 mg/kg | Freq: Once | SUBCUTANEOUS | Status: AC
  Administered 2012-12-11: 135 mg via SUBCUTANEOUS
  Filled 2012-12-11: qty 1

## 2012-12-11 MED ORDER — PNEUMOCOCCAL VAC POLYVALENT 25 MCG/0.5ML IJ INJ
0.5000 mL | INJECTION | INTRAMUSCULAR | Status: AC
  Filled 2012-12-11: qty 0.5

## 2012-12-11 MED ORDER — METOPROLOL TARTRATE 50 MG PO TABS
50.0000 mg | ORAL_TABLET | Freq: Two times a day (BID) | ORAL | Status: DC
  Administered 2012-12-11 – 2012-12-14 (×7): 50 mg via ORAL
  Filled 2012-12-11 (×2): qty 1
  Filled 2012-12-11: qty 2
  Filled 2012-12-11 (×6): qty 1

## 2012-12-11 MED ORDER — IOHEXOL 350 MG/ML SOLN
100.0000 mL | Freq: Once | INTRAVENOUS | Status: AC | PRN
  Administered 2012-12-11: 100 mL via INTRAVENOUS

## 2012-12-11 MED ORDER — ONDANSETRON HCL 4 MG/2ML IJ SOLN: 4.0000 mg | Freq: Three times a day (TID) | INTRAMUSCULAR | Status: DC | PRN

## 2012-12-11 MED ORDER — METOPROLOL TARTRATE 1 MG/ML IV SOLN
5.0000 mg | Freq: Once | INTRAVENOUS | Status: AC
  Administered 2012-12-11: 5 mg via INTRAVENOUS
  Filled 2012-12-11: qty 5

## 2012-12-11 MED ORDER — OXYCODONE HCL 5 MG PO TABS
5.0000 mg | ORAL_TABLET | ORAL | Status: DC | PRN
  Administered 2012-12-11: 5 mg via ORAL
  Filled 2012-12-11: qty 1

## 2012-12-11 MED ORDER — METOPROLOL TARTRATE 1 MG/ML IV SOLN: 5.0000 mg | INTRAVENOUS | Status: DC | PRN

## 2012-12-11 MED ORDER — ALBUTEROL SULFATE (5 MG/ML) 0.5% IN NEBU: 2.5000 mg | INHALATION_SOLUTION | RESPIRATORY_TRACT | Status: DC | PRN

## 2012-12-11 MED ORDER — HYDROMORPHONE HCL PF 1 MG/ML IJ SOLN: 1.0000 mg | INTRAMUSCULAR | Status: DC | PRN

## 2012-12-11 MED ORDER — ENOXAPARIN SODIUM 150 MG/ML ~~LOC~~ SOLN
1.0000 mg/kg | Freq: Two times a day (BID) | SUBCUTANEOUS | Status: DC
  Administered 2012-12-11 – 2012-12-14 (×6): 135 mg via SUBCUTANEOUS
  Filled 2012-12-11 (×8): qty 1

## 2012-12-11 MED ORDER — INSULIN ASPART 100 UNIT/ML ~~LOC~~ SOLN
0.0000 [IU] | Freq: Three times a day (TID) | SUBCUTANEOUS | Status: DC
  Administered 2012-12-11: 5 [IU] via SUBCUTANEOUS
  Administered 2012-12-11 – 2012-12-12 (×2): 3 [IU] via SUBCUTANEOUS
  Administered 2012-12-12: 5 [IU] via SUBCUTANEOUS
  Administered 2012-12-12: 3 [IU] via SUBCUTANEOUS
  Administered 2012-12-13: 2 [IU] via SUBCUTANEOUS
  Administered 2012-12-13 (×2): 3 [IU] via SUBCUTANEOUS
  Administered 2012-12-14: 2 [IU] via SUBCUTANEOUS
  Administered 2012-12-14: 3 [IU] via SUBCUTANEOUS
  Filled 2012-12-11: qty 1

## 2012-12-11 MED ORDER — SODIUM CHLORIDE 0.9 % IV BOLUS (SEPSIS)
500.0000 mL | Freq: Once | INTRAVENOUS | Status: AC
  Administered 2012-12-11: 500 mL via INTRAVENOUS

## 2012-12-11 MED ORDER — SODIUM CHLORIDE 0.9 % IJ SOLN
3.0000 mL | Freq: Two times a day (BID) | INTRAMUSCULAR | Status: DC
  Administered 2012-12-11: 18:00:00 via INTRAVENOUS
  Administered 2012-12-11 – 2012-12-12 (×3): 3 mL via INTRAVENOUS

## 2012-12-11 MED ORDER — ONDANSETRON HCL 4 MG PO TABS: 4.0000 mg | ORAL_TABLET | Freq: Four times a day (QID) | ORAL | Status: DC | PRN

## 2012-12-11 NOTE — Progress Notes (Signed)
Utilization review completed.  P.J. Kyleah Pensabene,RN,BSN Case Manager [phone]  

## 2012-12-11 NOTE — ED Notes (Signed)
REPORT CALLED TO 2900. 

## 2012-12-11 NOTE — ED Notes (Signed)
REPORT CALLED TO 2900.

## 2012-12-11 NOTE — Care Management Note (Unsigned)
    Page 1 of 1   12/13/2012     3:00:42 PM   CARE MANAGEMENT NOTE 12/13/2012  Patient:  Victor Barnes, Victor Barnes   Account Number:  [id number]  Date Initiated:  12/11/2012  Documentation initiated by:  Junius Creamer  Subjective/Objective Assessment:   adm w dvt     Action/Plan:   lives alone, pcp dr Aram Beecham white   Anticipated DC Date:  12/14/2012   Anticipated DC Plan:  HOME/SELF CARE      DC Planning Services  CM consult  Medication Assistance      Choice offered to / List presented to:             Status of service:  In process, will continue to follow Medicare Important Message given?   (If response is "NO", the following Medicare IM given date fields will be blank) Date Medicare IM given:   Date Additional Medicare IM given:    Discharge Disposition:    Per UR Regulation:  Reviewed for med. necessity/level of care/duration of stay  If discussed at Long Length of Stay Meetings, dates discussed:    Comments:  12/13/12 Rosalita Chessman [phone] PT DOES HAVE ACTIVE INSURANCE AND DRUG COVERAGE WITH [facility].  WILL NEED TO USE IN-NETWORK PHARMACY ([facility], [facility] AND [facility] ARE A FEW).  4/28 1352 debbie dowell rn,bsn pt has [employer] ins. he has 10days free xarelto and he has copay card that is good for 12months where he only has 10.00 per month copay. cards have been activated so all he will need is prescriptions at [facility]ch.

## 2012-12-11 NOTE — ED Notes (Signed)
Dr. Lavella Lemons informed of pt HR 140

## 2012-12-11 NOTE — Plan of Care (Signed)
Problem: Consults Goal: Diabetes Guidelines if Diabetic/Glucose > 140 If diabetic or lab glucose is > 140 mg/dl - Initiate Diabetes/Hyperglycemia Guidelines & Document Interventions  Outcome: Progressing Diabetes coordinator saw pt today for patient education  Problem: ICU Phase Progression Outcomes Goal: Flu/PneumoVaccines if indicated Outcome: Completed/Met Date Met:  12/11/12 Pneumococcal vaccine ordered for the am

## 2012-12-11 NOTE — ED Notes (Signed)
Dr. Ghimire at the bedside.  

## 2012-12-11 NOTE — ED Provider Notes (Signed)
History     CSN: [id number]  Arrival date & time 12/11/12  0246   None     Chief Complaint  Patient presents with  . Chest Pain    (Consider location/radiation/quality/duration/timing/severity/associated sxs/prior treatment) HPI  Victor Barnes is a 57 y.o. male complaining of acute onset of right upper anterior chest pain at 2 AM last night which woke him from sleep. Pain is described as dull, rated at 2/10 it is not associated with shortness of breath. Patient has a history of DVT, PE, or insulin-dependent diabetes, A. fib. He is not compliant with any of his medications secondary to monetary concerns. Patient has had a swelling in the left leg that has been worsening over the course of 3 weeks. He denies any recent surgeries, long trips or mobilizations. He denies shortness of breath, abdominal pain, fever, syncope, change in bowel or bladder habits.   PCP Laurann Montana @ [facility]    Past Medical History  Diagnosis Date  . Hypertension   . Sleep apnea   . Venous stasis   . DVT (deep venous thrombosis)     Recurrent RLE DVT, supposed to be on Coumadin but h/o non-compliance   . Cellulitis   . Atrial fibrillation or flutter     Should be on Coumadin but h/o non-compliance; started Sotalol 08/2009  . Diabetes mellitus     Past Surgical History  Procedure Laterality Date  . Leg surgery      History reviewed. No pertinent family history.  History  Substance Use Topics  . Smoking status: Never Smoker   . Smokeless tobacco: Not on file  . Alcohol Use: Yes      Review of Systems  Constitutional: Negative for fever.  Respiratory: Negative for shortness of breath.   Cardiovascular: Positive for chest pain and leg swelling.  Gastrointestinal: Negative for nausea, vomiting, abdominal pain and diarrhea.  All other systems reviewed and are negative.    Allergies  Review of patient's allergies indicates no known allergies.  Home Medications   Current Outpatient Rx   Name  Route  Sig  Dispense  Refill  . ibuprofen (ADVIL,MOTRIN) 200 MG tablet   Oral   Take 800 mg by mouth every 6 (six) hours as needed for pain.           BP 128/71  Pulse 99  Temp(Src) 98.9 F (37.2 C) (Oral)  Resp 17  Ht 6\' 1"  (1.854 m)  Wt 300 lb (136.079 kg)  BMI 39.59 kg/m2  SpO2 97%  Physical Exam  Nursing note and vitals reviewed. Constitutional: He is oriented to person, place, and time. He appears well-developed and well-nourished. No distress.  Obese  HENT:  Head: Normocephalic.  Eyes: Conjunctivae and EOM are normal. Pupils are equal, round, and reactive to light.  Neck: No JVD present.  Cardiovascular:  Tachycardic in the 150s  Pulmonary/Chest: Effort normal and breath sounds normal. No stridor. No respiratory distress. He has no wheezes. He has no rales. He exhibits no tenderness.  Abdominal: Soft. There is no tenderness.  Musculoskeletal: Normal range of motion. He exhibits edema.  Pitting edema and warmth to left lower extremity, calf asymmetry, Homans sign positive, no palpable cords or superficial collaterals.  Neurological: He is alert and oriented to person, place, and time.  Psychiatric: He has a normal mood and affect.    ED Course  Procedures (including critical care time)  Labs Reviewed  CBC - Abnormal; Notable for the following:    WBC  13.6 (*)    All other components within normal limits  BASIC METABOLIC PANEL - Abnormal; Notable for the following:    Glucose, Bld 320 (*)    All other components within normal limits  PROTIME-INR  APTT  HEPATIC FUNCTION PANEL  POCT I-STAT TROPONIN I   Dg Chest 2 View  12/11/2012  *RADIOLOGY REPORT*  Clinical Data: Chest pain in the right side and sternal region.  CHEST - 2 VIEW  Comparison: 07/16/2011  Findings: Shallow inspiration.  Mild cardiac enlargement with mild pulmonary vascular congestion.  No edema or consolidation.  No blunting of costophrenic angles.  No pneumothorax.  Mediastinal contours  appear intact.  No significant change since previous study.  Degenerative changes in the spine.  IMPRESSION: Mild pulmonary vascular congestion without consolidation or edema.   Original Report Authenticated By: Burman Nieves, M.D.     Date: 12/11/2012  Rate: 142  Rhythm: sinus tachycardia  QRS Axis: normal  Intervals: normal  ST/T Wave abnormalities: nonspecific ST/T changes  Conduction Disutrbances:right bundle branch block  Narrative Interpretation:   Old EKG Reviewed: unchanged  7:29 AM verbal report from radiology appreciated: Patient has bilateral lower lobe pulmonary embolisms.    1. PE (pulmonary embolism)   2. DVT (deep venous thrombosis), left   3. Non compliance w medication regimen       MDM   Victor Barnes is a 57 y.o. male with acute onset of mild right chest pain not associated with shortness of breath. Patient has a history of DVT and physical exam is consistent with left-sided DVT. Patient's sent for CTA to rule out pulmonary embolism, started on Lovenox. Patient is tachycardic in the 150s, he has a history of A. fib however it appears to be a sinus tach on EKG, as per attending Dr. Lavella Lemons.  VSS, Pt on monitor, patient started on Lovenox  CTA shows bilateral lower lobe pulmonary emboli.   [facility] Dr. Jerral Ralph to step down bed.     Filed Vitals:   12/11/12 0600 12/11/12 0615 12/11/12 0630 12/11/12 0728  BP: 90/61 115/78 117/78 117/74  Pulse: 47 142 141   Temp:      TempSrc:      Resp: 22 25 21 22   Height:      Weight:      SpO2:    100%         Wynetta Emery, PA-C 12/11/12 0749  Wynetta Emery, PA-C 12/11/12 1610

## 2012-12-11 NOTE — ED Notes (Signed)
Report given to Drinda Butts, RN on pod C

## 2012-12-11 NOTE — H&P (Signed)
PATIENT DETAILS Name: Victor Barnes Age: 57 y.o. Sex: male Date of Birth: 18-Aug-1955 Admit Date: 12/11/2012 AVW:UJWJX,BJYNWGN S, MD   CHIEF COMPLAINT:  Chest discomfort  HPI: Victor Barnes is a 57 y.o. male with a Past Medical History of atrial fibrillation/atrial flutter, numerous episodes of venous thromboembolism, noncompliance to medication including anticoagulation who presents today with the above noted complaint. Per patient, he woke up early this morning with chest discomfort, he was then brought to the emergency room where he was found to have tachycardia and a CT angiogram was positive for pulmonary embolism. The patient for the past 2-4 days he has had worsening swelling of his left lower extremity. Patient claims that he has been off all of his medications including antihypertensives, diabetic medications and Xarelto since December of 2013 due to financial issues. He has not seen his primary care practitioner for more than a year. Patient is now being admitted to the hospital for further evaluation and treatment. During my evaluation, the patient's heart rate was in the 140s, it appeared that he was in atrial flutter-after getting 5 mg of IV Lopressor, his heart rate slowed down and the flutter waves were more apparent. He denies any chest pain or shortness of breath during my evaluation  ALLERGIES:  No Known Allergies  PAST MEDICAL HISTORY: Past Medical History  Diagnosis Date  . Hypertension   . Sleep apnea   . Venous stasis   . DVT (deep venous thrombosis)     Recurrent RLE DVT, supposed to be on Coumadin but h/o non-compliance   . Cellulitis   . Atrial fibrillation or flutter     Should be on Coumadin but h/o non-compliance; started Sotalol 08/2009  . Diabetes mellitus   . Pulmonary embolism     PAST SURGICAL HISTORY: Past Surgical History  Procedure Laterality Date  . Leg surgery      MEDICATIONS AT HOME: Prior to Admission medications   Medication  Sig Start Date End Date Taking? Authorizing Provider  ibuprofen (ADVIL,MOTRIN) 200 MG tablet Take 800 mg by mouth every 6 (six) hours as needed for pain.   Yes Historical Provider, MD    FAMILY HISTORY: Diabetes and hypertension in his parents  SOCIAL HISTORY:  reports that he has never smoked. He does not have any smokeless tobacco history on file. He reports that  drinks alcohol. He reports that he does not use illicit drugs.  REVIEW OF SYSTEMS:  Constitutional:   No  weight loss, night sweats,  Fevers, chills, fatigue.  HEENT:    No headaches, Difficulty swallowing,Tooth/dental problems,Sore throat,  No sneezing, itching, ear ache, nasal congestion, post nasal drip,   Cardio-vascular: No Orthopnea, PND, swelling in lower extremities, anasarca, dizziness, palpitations  GI:  No heartburn, indigestion, abdominal pain, nausea, vomiting, diarrhea, change in bowel habits, loss of appetite  Resp: No shortness of breath with exertion or at rest.  No excess mucus, no productive cough, No non-productive cough,  No coughing up of blood.No change in color of mucus.No wheezing.No chest wall deformity  Skin:  no rash or lesions.  GU:  no dysuria, change in color of urine, no urgency or frequency.  No flank pain.  Musculoskeletal: No joint pain or swelling.  No decreased range of motion.  No back pain.  Psych: No change in mood or affect. No depression or anxiety.  No memory loss.   PHYSICAL EXAM: Blood pressure 105/73, pulse 98, temperature 98.9 F (37.2 C), temperature source Oral, resp. rate 14,  height 6\' 1"  (1.854 m), weight 136.079 kg (300 lb), SpO2 100.00%.  General appearance :Awake, alert, not in any distress. Speech Clear. Not toxic Looking HEENT: Atraumatic and Normocephalic, pupils equally reactive to light and accomodation Neck: supple, no JVD. No cervical lymphadenopathy.  Chest:Good air entry bilaterally, no added sounds  CVS: S1 S2 irregular, no murmurs.  Abdomen:  Bowel sounds present, Non tender and not distended with no gaurding, rigidity or rebound. Extremities: B/L Lower Ext shows no edema, both legs are warm to touch Neurology: Awake alert, and oriented X 3, CN II-XII intact, Non focal Skin:No Rash Wounds:N/A  LABS ON ADMISSION:   Recent Labs  12/11/12 0334  NA 135  K 3.9  CL 98  CO2 28  GLUCOSE 320*  BUN 8  CREATININE 0.81  CALCIUM 9.5    Recent Labs  12/11/12 0745  AST 10  ALT 12  ALKPHOS 98  BILITOT 0.6  PROT 7.0  ALBUMIN 3.3*   No results found for this basename: LIPASE, AMYLASE,  in the last 72 hours  Recent Labs  12/11/12 0334  WBC 13.6*  HGB 14.8  HCT 42.1  MCV 79.9  PLT 205   No results found for this basename: CKTOTAL, CKMB, CKMBINDEX, TROPONINI,  in the last 72 hours No results found for this basename: DDIMER,  in the last 72 hours No components found with this basename: POCBNP,    RADIOLOGIC STUDIES ON ADMISSION: Dg Chest 2 View  12/11/2012  *RADIOLOGY REPORT*  Clinical Data: Chest pain in the right side and sternal region.  CHEST - 2 VIEW  Comparison: 07/16/2011  Findings: Shallow inspiration.  Mild cardiac enlargement with mild pulmonary vascular congestion.  No edema or consolidation.  No blunting of costophrenic angles.  No pneumothorax.  Mediastinal contours appear intact.  No significant change since previous study.  Degenerative changes in the spine.  IMPRESSION: Mild pulmonary vascular congestion without consolidation or edema.   Original Report Authenticated By: Burman Nieves, M.D.    Ct Angio Chest Pe W/cm &/or Wo Cm  12/11/2012  *RADIOLOGY REPORT*  Clinical Data: 57 year old male awakened by pain radiating from the right shoulder to the upper abdomen.  Shortness of breath.  Left lower extremity swelling.  History of DVT not currently anticoagulated.  CT ANGIOGRAPHY CHEST  Technique:  Multidetector CT imaging of the chest using the standard protocol during bolus administration of intravenous  contrast. Multiplanar reconstructed images including MIPs were obtained and reviewed to evaluate the vascular anatomy.  Contrast: OMNIPAQUE IOHEXOL 350 MG/ML SOLN  Comparison: Chest radiographs 12/11/2012.  Chest CTA 07/16/2011.  Findings: Adequate contrast bolus timing in the pulmonary arterial tree.  No main or central pulmonary embolus.  Left upper lobe pulmonary arterial branches appear within normal limits.  Low density filling defect compatible with thrombus in the left lower lobe segmental branches (series 6 image 132, 137). Respiratory motion artifact at the left lung base. Additional respiratory motion artifact in the right upper lobe. Right upper lobe branches appear stable and within normal limits. However, there is there is low density clot in the right lower lobe segmental and subsegmental branches (series 6 image 165).  This may be worse in the right lower lobe posterior basal segment.  Major airways are patent except for atelectatic changes.  Trace layering right pleural effusion.  Lower lung volumes.  Mild respiratory motion and atelectasis.  No consolidation or confluent opacities suspicious for pulmonary infarct at this time.  Stable calcified left thyroid nodule.  Mediastinal  lipomatosis. Scattered small mediastinal lymph nodes are not significantly changed.  No pericardial effusion.  Stable and negative visualized aorta.  Stable and negative visualized upper abdominal viscera.  Degenerative changes in the spine with multilevel endplate osteophytes. No acute osseous abnormality identified.  IMPRESSION: 1.  Positive for bilateral lower lobe pulmonary emboli. 2.  Trace right pleural effusion.  Mild pulmonary atelectasis. 3.  Stable 1 cm calcified left thyroid nodule since 2012.  Critical Value/emergent results were called by telephone at the time of interpretation on 12/11/2012 at 0728 hours to ED PA Pisciotta, who verbally acknowledged these results.   Original Report Authenticated By: Erskine Speed, M.D.     EKG: Atrial flutter  ASSESSMENT AND PLAN: Present on Admission:  . PE (pulmonary embolism) - Recurrent venous thromboembolism-secondary to noncompliance with anticoagulation  - Will for now place on Lovenox, will need to touch base with case management to see if we can provide an adequate supply ofXarelto. Patient is not keen on being on Coumadin  - Needs lifelong anticoagulation  . DVT (deep venous thrombosis) - Placed on Lovenox-therapeutic dosing  - Check Dopplers  - Does have some mild cellulitic/erythemic changes-think this is mostly from possible DVT and not cellulitis. Will hold off on starting antibiotics   . Atrial fibrillation with RVR - heart rate in the 140s-suspect atrial fibrillation/atrial flutter with rapid ventricular response  - Given one dose of 5 mg IV metoprolol with good response-will start on oral metoprolol and use IV metoprolol as needed. If need be we can place him on a Cardizem drip.  - Will need anticoagulation for pulmonary embolism   . Diabetes mellitus  - Check A1c  - Place SSI for now   . Hypertension - Starting on Lopressor-we'll titrate accordingly  Further plan will depend as patient's clinical course evolves and further radiologic and laboratory data become available. Patient will be monitored closely.   DVT Prophylaxis: Not needed as on therapeutic Lovenox.  Code Status: Full Code  Total time spent for admission equals 45 minutes.  [facility] Pager [phone]  If 7PM-7AM, please contact night-coverage [email or web address] Password [facility] 12/11/2012, 10:06 AM

## 2012-12-11 NOTE — Progress Notes (Signed)
Received verbal report from Vascular Lab Tech regarding results of doppler study that revealed a DVT in the left leg with other superficial clots.  MD paged to report.

## 2012-12-11 NOTE — ED Notes (Addendum)
Presents with chest pain that began this evening that woke him from sleep. Pain begins in right shoulder and radiates into epigastric worse with inspiration. Denies nausea, reports SOB and left leg swelling. HX DVT. HR 146 on EKG

## 2012-12-11 NOTE — Progress Notes (Signed)
VASCULAR LAB PRELIMINARY  PRELIMINARY  PRELIMINARY  PRELIMINARY  Left lower extremity venous duplex completed.    Preliminary report: Positive of DVT of the left lower extremity. Posterior tibial vein through the popliteal vein. Femoral and profunda veins are patent. DVT is also noted extending from the mid common femoral to just proximal to the saphenofemoral junction. The proximal common femora vein is patent. There is a superficial thrombus throughout the entire greater saphenous vein through the saphenofemoral juction. There is no evidence of a Baker's cyst. There is also no evidence of propagation to the right side.  Tanetta Fuhriman, RVS 12/11/2012, 6:41 PM

## 2012-12-12 LAB — COMPREHENSIVE METABOLIC PANEL
ALT: 9 U/L (ref 0–53)
Alkaline Phosphatase: 89 U/L (ref 39–117)
CO2: 27 mEq/L (ref 19–32)
Calcium: 8.7 mg/dL (ref 8.4–10.5)
GFR calc Af Amer: >90 mL/min (ref >90)
GFR calc non Af Amer: >90 mL/min (ref >90)
Glucose, Bld: 239 mg/dL — ABNORMAL HIGH (ref 70–99)
Sodium: 135 mEq/L (ref 135–145)
Total Bilirubin: 0.6 mg/dL (ref 0.3–1.2)

## 2012-12-12 LAB — CBC
Hemoglobin: 13.3 g/dL (ref 13.0–17.0)
MCH: 28 pg (ref 26.0–34.0)
MCV: 80.6 fL (ref 78.0–100.0)
RBC: 4.75 MIL/uL (ref 4.22–5.81)

## 2012-12-12 MED ORDER — BD GETTING STARTED TAKE HOME KIT: 1/2ML X 30G SYRINGES
1.0000 | Freq: Once | Status: DC
  Filled 2012-12-12: qty 1

## 2012-12-12 MED ORDER — BD GETTING STARTED TAKE HOME KIT: 3/10ML X 30G SYRINGES
1.0000 | Freq: Once | Status: AC
  Administered 2012-12-12: 1
  Filled 2012-12-12: qty 1

## 2012-12-12 MED ORDER — INSULIN GLARGINE 100 UNIT/ML ~~LOC~~ SOLN
10.0000 [IU] | Freq: Every day | SUBCUTANEOUS | Status: DC
  Administered 2012-12-12: 10 [IU] via SUBCUTANEOUS
  Filled 2012-12-12 (×2): qty 0.1

## 2012-12-12 NOTE — Progress Notes (Signed)
[facility] Progress Note [place] TEAM 1 - Stepdown/ICU TEAM   VAIDEN ADAMES ZOX:[id number] DOB: 03-Jun-1956 DOA: 12/11/2012 PCP: Cala Bradford, MD  HPI: Victor Barnes is a 57 y.o. male with a Past Medical History of atrial fibrillation/atrial flutter, numerous episodes of venous thromboembolism, noncompliance to medication including anticoagulation who presents today with the above noted complaint. Per patient, he woke up early this morning with chest discomfort, he was then brought to the emergency room where he was found to have tachycardia and a CT angiogram was positive for pulmonary embolism. The patient for the past 2-4 days he has had worsening swelling of his left lower extremity.  Patient claims that he has been off all of his medications including antihypertensives, diabetic medications and Xarelto since December of 2013 due to financial issues. He has not seen his primary care practitioner for more than a year.   Assessment/Plan: Principal Problem:   DVT /PE  -Has been advised not to discontinue Xarelto this time -Edema of left leg is improving  Active Problems:   Hypertension -Continue metoprolol    Diabetes mellitus -Hemoglobin A1c is elevated at 11.1 and therefore I have thoroughly discussed that he will need Lantus and NovoLog to help control his sugars-she is in agreement with this and therefore teaching has been initiated -Will also need a glucometer prior to discharge    Atrial fibrillation with RVR -Currently rate controlled -2-D echo has been performed and is pending  Calcified thyroid nodule -Chronic and seen on CT    Code Status: Full code Family Communication: None Disposition Plan: Transfer to telemetry and hopefully home tomorrow  Consultants: None Procedures: None  Antibiotics: None  DVT prophylaxis: Full dose Lovenox  HPI/Subjective: Patient is alert and laying in bed. He has no complaints. He believes the swelling in his  left leg has improved. No complaint of chest pain palpitations or shortness of breath. Have discussed starting insulin as mentioned above and he is in agreement with it. He does have [place] health care and states that he will be able to pay for medications.   Objective: Blood pressure 167/83, pulse 81, temperature 97.7 F (36.5 C), temperature source Oral, resp. rate 20, height 6\' 1"  (1.854 m), weight 137.2 kg (302 lb 7.5 oz), SpO2 99.00%.  Intake/Output Summary (Last 24 hours) at 12/12/12 1614 Last data filed at 12/12/12 1400  Gross per 24 hour  Intake   1971 ml  Output   1625 ml  Net    346 ml     Exam: General: No acute respiratory distress Lungs: Clear to auscultation bilaterally without wheezes or crackles Cardiovascular: Irregularly irregular rate and rhythm without murmur gallop or rub normal S1 and S2 Abdomen: Nontender, nondistended, soft, bowel sounds positive, no rebound, no ascites, no appreciable mass Extremities: No significant cyanosis, clubbing, there is significant edema of the left lower extremy  Data Reviewed: Basic Metabolic Panel:  Recent Labs Lab 12/11/12 0334 12/12/12 0448  NA 135 135  K 3.9 3.8  CL 98 100  CO2 28 27  GLUCOSE 320* 239*  BUN 8 7  CREATININE 0.81 0.70  CALCIUM 9.5 8.7   Liver Function Tests:  Recent Labs Lab 12/11/12 0745 12/12/12 0448  AST 10 13  ALT 12 9  ALKPHOS 98 89  BILITOT 0.6 0.6  PROT 7.0 6.5  ALBUMIN 3.3* 2.9*   No results found for this basename: LIPASE, AMYLASE,  in the last 168 hours No results found for this basename: AMMONIA,  in the last 168 hours CBC:  Recent Labs Lab 12/11/12 0334 12/12/12 0448  WBC 13.6* 10.7*  HGB 14.8 13.3  HCT 42.1 38.3*  MCV 79.9 80.6  PLT 205 177   Cardiac Enzymes:  Recent Labs Lab 12/11/12 1057 12/11/12 1611 12/11/12 2215  TROPONINI <0.30 <0.30 <0.30   BNP (last 3 results) No results found for this basename: PROBNP,  in the last 8760 hours CBG:  Recent  Labs Lab 12/11/12 1121 12/11/12 1613 12/11/12 2137 12/12/12 0805 12/12/12 1213  GLUCAP 262* 245* 327* 227* 238*    Recent Results (from the past 240 hour(s))  MRSA PCR SCREENING     Status: None   Collection Time    12/11/12  2:24 PM      Result Value Range Status   MRSA by PCR NEGATIVE  NEGATIVE Final   Comment:            The GeneXpert MRSA Assay (FDA     approved for NASAL specimens     only), is one component of a     comprehensive MRSA colonization     surveillance program. It is not     intended to diagnose MRSA     infection nor to guide or     monitor treatment for     MRSA infections.     Studies:  Recent x-ray studies have been reviewed in detail by the Attending Physician  Scheduled Meds:  Scheduled Meds: . enoxaparin (LOVENOX) injection  1 mg/kg Subcutaneous BID  . insulin aspart  0-9 Units Subcutaneous TID WC  . insulin glargine  10 Units Subcutaneous Daily  . metoprolol tartrate  50 mg Oral BID  . pneumococcal 23 valent vaccine  0.5 mL Intramuscular Tomorrow-1000  . sodium chloride  3 mL Intravenous Q12H   Continuous Infusions: . sodium chloride 50 mL/hr at 12/12/12 1507    Time spent on care of this patient: 35 minutes   Calvert Cantor, MD [phone]  [facility] Office  [phone] Pager - Text Page per Amion as per below:  On-Call/Text Page:      [email or web address]      password [email or web address]  If 7PM-7AM, please contact night-coverage [email or web address] Password [email or web address] 12/12/2012, 4:14 PM   LOS: 1 day

## 2012-12-12 NOTE — Progress Notes (Signed)
  Echocardiogram 2D Echocardiogram has been performed.  Victor Barnes 12/12/2012, 11:54 AM

## 2012-12-12 NOTE — Progress Notes (Signed)
Report given to [facility] on 2000, to transfer to 2011 via wheelchair, Berle Mull RN

## 2012-12-12 NOTE — Progress Notes (Signed)
Received from 3300. Assessed per flow sheet. Denies chest pain or shortness of breath. Call bell near. Mamie Levers

## 2012-12-12 NOTE — Progress Notes (Signed)
Inpatient Diabetes Program Recommendations  AACE/ADA: New Consensus Statement on Inpatient Glycemic Control (2013)  Target Ranges:  Prepandial:   less than 140 mg/dL      Peak postprandial:   less than 180 mg/dL (1-2 hours)      Critically ill patients:  140 - 180 mg/dL   Reason for Visit: MD Consult   Note:  Began assessment.  Hgb A1C ordered but results not back.  Patient relates financial difficulties as a barrier in taking prescribed medications.  Diabetes was being treated with Metformin and even though he used [facility] as his pharmacy, was not able to get Metformin for $4 due to insurance issues.  Later when he got a new job and new insurance, needed a new Rx to get his metformin but couldn't afford to see MD.  Patient states he has difficulty with breakfast and lunch meals (timing and composition) due to erratic events at work.  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    Does not monitor CBG's at home.  Used to have a meter, but during the time he had no insurance, couldn't get strips due to expense.  Will give him info regarding a generic meter on Tuesday.  Informed patient that [facility] residents can go to diabetes education at [facility] at no charge.  Will give info regarding registering for education on Tuesday.  Showed patient how to access the Patient Education Network.  Will follow-up Tuesday after Hgb A1C known.  Thank you.  Kayleen Alig S. Elsie Lincoln, RN, CNS, CDE Inpatient Diabetes Program, team pager [phone]

## 2012-12-12 NOTE — Progress Notes (Signed)
Inpatient Diabetes Program Recommendations  AACE/ADA: New Consensus Statement on Inpatient Glycemic Control (2013)  Target Ranges:  Prepandial:   less than 140 mg/dL      Peak postprandial:   less than 180 mg/dL (1-2 hours)      Critically ill patients:  140 - 180 mg/dL   Reason for Visit: Medication recommendations  Inpatient Diabetes Program Recommendations Insulin - Basal: CBG before breakfast still elevated.  Would benefit from basal insulin Correction (SSI): Currently on sensitive correction,  Please increase to resistent scale. Insulin - Meal Coverage: While in the hospital, may benefit from meal coverage in addition to correction scale. Oral Agents: Was supposed to be taking Metformin prior to admission.  At discharge, please consider restarting Metformin. HgbA1C: Hgb A1C is 11.1 indicating mean glucose of 272 Outpatient Referral: Encouraging patient to follow-up at [facility] for group diabetes education at no charge. Diet: Needs CHO modified Medium restriction added to diet order.  Note:  Have not visited patient yet today, but have the following recommendations: Add basal insulin, either Lantus 40 units or Levemir 40 units at HS.  If patient cannot afford these insulins at discharge, could use generic NPH.  Change correction scale from sensitive to resistant. (May also need meal coverage while in the hospital-- Novolog 6 units tid with meals.)   Add CHO Modified Medium restriction to diet order.  Order Metformin at discharge.       Will visit patient in room later today.  Thank you.  Rowyn Mustapha S. Elsie Lincoln, RN, CNS, CDE Inpatient Diabetes Program, team pager [phone]

## 2012-12-12 NOTE — Progress Notes (Addendum)
Diabetic facititator in to see patient, obtained 3/10cc Insulin starter kit from pharmacy and provided to the patient, Berle Mull RN Also instructed on how to access on demand Patient Education channel and "Diabetes Basics" accessed on television for patient to view at this time, Berle Mull RN

## 2012-12-12 NOTE — ED Provider Notes (Signed)
Medical screening examination/treatment/procedure(s) were performed by non-physician practitioner and as supervising physician I was immediately available for consultation/collaboration.   Brandt Loosen, MD 12/12/12 [phone]

## 2012-12-12 NOTE — Progress Notes (Addendum)
Inpatient Diabetes Program Recommendations  AACE/ADA: New Consensus Statement on Inpatient Glycemic Control (2013)  Target Ranges:  Prepandial:   less than 140 mg/dL      Peak postprandial:   less than 180 mg/dL (1-2 hours)      Critically ill patients:  140 - 180 mg/dL   Reason for Visit: Follow-up from yesterday  Note: Patient states that he has been informed of his Hgb A1C result and understands that he will need insulin at discharge.  Will order an insulin starter kit for him and request nursing staff review insulin prep & administration with him.  (Patient states that he has been trained on insulin in the past.)  Patient questioned why pills used to control his blood sugar in the past, but are not expected to control it again.  Informed him that people who with long-standing diabetes will probably require insulin at some point in their life.  Gave patient information regarding generic meter and strips from [employer] in case he has another problem with insurance coverage.  Patient will need a Rx for a glucose meter, test strips, and lancets at discharge.  Gave patient information regarding generic insulins available at [facility] in case he has no insurance coverage in the future.  Stressed that I was just telling him about them so he would be aware that they were available-- but he would need advice from a physician regarding which one to take, how much to take, and when to take it.  (Did this given his previous history of stopping meds when he had financial difficulty and no insurance.)  Gave patient information regarding accessing the outpatient diabetes education available at [facility].  He needs to call to register for class.  He can also request 1:1 session with the dietitian, Altamese Cabal, RD, LDN.  Told him that I would be glad to email her and give her a "heads-up" regarding a possible request for 1:1, but he stated he would handle it.  Patient thanked me for the info.  Thank  you.  Jerni Selmer S. Elsie Lincoln, RN, CNS, CDE Inpatient Diabetes Program, team pager [phone]    Did not order a diabetes booklet.  States he already has one at home.

## 2012-12-13 LAB — GLUCOSE, CAPILLARY
Glucose-Capillary: 203 mg/dL — ABNORMAL HIGH (ref 70–99)
Glucose-Capillary: 204 mg/dL — ABNORMAL HIGH (ref 70–99)
Glucose-Capillary: 215 mg/dL — ABNORMAL HIGH (ref 70–99)

## 2012-12-13 MED ORDER — INSULIN GLARGINE 100 UNIT/ML ~~LOC~~ SOLN
15.0000 [IU] | Freq: Every day | SUBCUTANEOUS | Status: DC
  Administered 2012-12-13 – 2012-12-14 (×2): 15 [IU] via SUBCUTANEOUS
  Filled 2012-12-13 (×3): qty 0.15

## 2012-12-13 MED ORDER — LISINOPRIL 5 MG PO TABS
5.0000 mg | ORAL_TABLET | Freq: Every day | ORAL | Status: DC
  Administered 2012-12-13 – 2012-12-14 (×2): 5 mg via ORAL
  Filled 2012-12-13 (×2): qty 1

## 2012-12-13 NOTE — Progress Notes (Addendum)
Pt has been on "heart healthy" diet with no carbohydrate restriction. I added 'carb modified' to diet orders. Heart Healthy has more carbohydrate content than a Regular diet. Thus, diet changes may make a difference. Will order an RD consult as well if possible before discharge.  Have reviewed notes regarding pt being discharged home on insulin. I am however, unsure of status of pt insurance. Pt has been told how to get generic meter and strips, but if he does have active insurance, he can get the meter that his insurance will cover. Will request a case management consult.  If no insurance is active at this time, will need to change patient to either NPH or the older 70/30 (NPH/Reg). Thank you, Lenor Coffin, RN, CNS, Diabetes Coordinator [phone])

## 2012-12-13 NOTE — Progress Notes (Addendum)
Nutrition Consult/Brief Note  RD consulted for diet education regarding "portion control and fat restriction".  Lab Results  Component Value Date   HGBA1C 11.1* 12/11/2012    CBG (last 3)   Recent Labs  12/13/12 0009 12/13/12 0612 12/13/12 1109  GLUCAP 203* 184* 204*    RD provided "Carbohydrate Counting for People with Diabetes" handout from the Academy of [employer]etics ---> handout does address limited fat and increasing fiber with meal planning.  Patient opted to reviewed handouts on his own time.  Body mass index is 39.42 kg/(m^2). Patient meets criteria for Obesity Class II based on current BMI.   Current diet order is Heart Healthy, Carbohydrate Modified Medium Calorie, patient is consuming approximately 75-100% of meals at this time. Labs and medications reviewed.   No nutrition interventions warranted at this time.  Please re-consult RD as needed.  Maureen Chatters, RD, LDN Pager #: [phone] After-Hours Pager #: [phone]

## 2012-12-13 NOTE — Progress Notes (Signed)
Pt administered own insulin. Pt demonstrated correct technique during insulin administration.

## 2012-12-13 NOTE — Progress Notes (Signed)
[facility] Progress Note    OLLIE ESTY ZOX:[id number] DOB: 1956-02-05 DOA: 12/11/2012 PCP: Cala Bradford, MD  HPI: Victor Barnes is a 57 y.o. male with a Past Medical History of atrial fibrillation/atrial flutter, numerous episodes of venous thromboembolism, noncompliance to medication including anticoagulation who presents today with the above noted complaint. Per patient, he woke up early this morning with chest discomfort, he was then brought to the emergency room where he was found to have tachycardia and a CT angiogram was positive for pulmonary embolism. The patient for the past 2-4 days he has had worsening swelling of his left lower extremity.  Patient claims that he has been off all of his medications including antihypertensives, diabetic medications and Xarelto since December of 2013 due to financial issues. He has not seen his primary care practitioner for more than a year.   Assessment/Plan: Principal Problem:   DVT /PE  -Has been advised not to discontinue Xarelto this time -currently on lovenox- change to xarelto at d/c -Edema of left leg is improving     Hypertension -Continue metoprolol    Diabetes mellitus -Hemoglobin A1c is elevated at 11.1 and therefore I have thoroughly discussed that he will need Lantus and NovoLog to help control his sugars-he is in agreement with this and therefore teaching has been initiated -Will also need a glucometer prior to discharge - titrate lantus up    Atrial fibrillation with RVR -Currently rate controlled -2-D echo:Procedure narrative: Transthoracic echocardiography. Image quality was suboptimal. The study was technically difficult, as a result of body habitus. - Left ventricle: The cavity size was normal. Wall thickness was increased in a pattern of mild LVH. There was hypertrophy, with an appearance suggesting concentric remodeling (increased wall thickness with normal wall mass). Systolic function was mildly  to moderately reduced. The estimated ejection fraction was in the range of 40% to 45%. Diffuse hypokinesis. Regional wall motion abnormalities cannot be excluded. - Left atrium: The atrium was mildly dilated. - Right ventricle: Not able to assess RV function due to poor visualization. Not able to assess RV size due to poor visualization. Impressions:  - An atrial fibrillation rhythm was noted on this study, making this study technically difficult for the assessment of cardiac function.    Calcified thyroid nodule -Chronic and seen on CT    Code Status: Full code Family Communication: None Disposition Plan: home?  Consultants: None Procedures: None  Antibiotics: None  DVT prophylaxis: Full dose Lovenox  HPI/Subjective: Worried about the swelling in his leg- while refusing to have it elevated   Objective: Blood pressure 151/86, pulse 90, temperature 98.7 F (37.1 C), temperature source Oral, resp. rate 20, height 6\' 1"  (1.854 m), weight 135.489 kg (298 lb 11.2 oz), SpO2 98.00%.  Intake/Output Summary (Last 24 hours) at 12/13/12 0932 Last data filed at 12/12/12 1755  Gross per 24 hour  Intake   1003 ml  Output    300 ml  Net    703 ml     Exam: General: No acute respiratory distress Lungs: Clear to auscultation bilaterally without wheezes or crackles Cardiovascular: Irregularly irregular rate and rhythm without murmur gallop or rub normal S1 and S2 Abdomen: Nontender, nondistended, soft, bowel sounds positive, no rebound, no ascites, no appreciable mass Extremities: No significant cyanosis, clubbing, there is significant edema of the left lower extremy- 2+ pitting  Data Reviewed: Basic Metabolic Panel:  Recent Labs Lab 12/11/12 0334 12/12/12 0448  NA 135 135  K 3.9 3.8  CL  98 100  CO2 28 27  GLUCOSE 320* 239*  BUN 8 7  CREATININE 0.81 0.70  CALCIUM 9.5 8.7   Liver Function Tests:  Recent Labs Lab 12/11/12 0745 12/12/12 0448  AST 10 13   ALT 12 9  ALKPHOS 98 89  BILITOT 0.6 0.6  PROT 7.0 6.5  ALBUMIN 3.3* 2.9*   No results found for this basename: LIPASE, AMYLASE,  in the last 168 hours No results found for this basename: AMMONIA,  in the last 168 hours CBC:  Recent Labs Lab 12/11/12 0334 12/12/12 0448  WBC 13.6* 10.7*  HGB 14.8 13.3  HCT 42.1 38.3*  MCV 79.9 80.6  PLT 205 177   Cardiac Enzymes:  Recent Labs Lab 12/11/12 1057 12/11/12 1611 12/11/12 2215  TROPONINI <0.30 <0.30 <0.30   BNP (last 3 results) No results found for this basename: PROBNP,  in the last 8760 hours CBG:  Recent Labs Lab 12/12/12 0805 12/12/12 1213 12/12/12 1648 12/13/12 0009 12/13/12 0612  GLUCAP 227* 238* 266* 203* 184*    Recent Results (from the past 240 hour(s))  MRSA PCR SCREENING     Status: None   Collection Time    12/11/12  2:24 PM      Result Value Range Status   MRSA by PCR NEGATIVE  NEGATIVE Final   Comment:            The GeneXpert MRSA Assay (FDA     approved for NASAL specimens     only), is one component of a     comprehensive MRSA colonization     surveillance program. It is not     intended to diagnose MRSA     infection nor to guide or     monitor treatment for     MRSA infections.     Studies:  Recent x-ray studies have been reviewed in detail by the Attending Physician  Scheduled Meds:  Scheduled Meds: . enoxaparin (LOVENOX) injection  1 mg/kg Subcutaneous BID  . insulin aspart  0-9 Units Subcutaneous TID WC  . insulin glargine  10 Units Subcutaneous Daily  . lisinopril  5 mg Oral Daily  . metoprolol tartrate  50 mg Oral BID  . pneumococcal 23 valent vaccine  0.5 mL Intramuscular Tomorrow-1000  . sodium chloride  3 mL Intravenous Q12H   Continuous Infusions:    Time spent on care of this patient: 35 minutes   Marlin Canary, MD [phone]  [facility] Office  [phone] Pager - Text Page per Amion as per below:  On-Call/Text Page:      [email or web address]      password  [email or web address]  If 7PM-7AM, please contact night-coverage [email or web address] Password [facility] 12/13/2012, 9:32 AM   LOS: 2 days

## 2012-12-14 LAB — GLUCOSE, CAPILLARY
Glucose-Capillary: 168 mg/dL — ABNORMAL HIGH (ref 70–99)
Glucose-Capillary: 201 mg/dL — ABNORMAL HIGH (ref 70–99)

## 2012-12-14 MED ORDER — RIVAROXABAN 15 MG PO TABS: 15.0000 mg | ORAL_TABLET | Freq: Two times a day (BID) | ORAL | Status: DC

## 2012-12-14 MED ORDER — LISINOPRIL 5 MG PO TABS: 5.0000 mg | ORAL_TABLET | Freq: Every day | ORAL | Status: DC

## 2012-12-14 MED ORDER — INSULIN ADMIN SUPPLIES KIT: 1.0000 | PACK | Freq: Once | Status: DC

## 2012-12-14 MED ORDER — INSULIN GLARGINE 100 UNIT/ML ~~LOC~~ SOLN: 15.0000 [IU] | Freq: Every day | SUBCUTANEOUS | Status: DC

## 2012-12-14 MED ORDER — INSULIN ASPART 100 UNIT/ML ~~LOC~~ SOLN: 0.0000 [IU] | Freq: Three times a day (TID) | SUBCUTANEOUS | Status: DC

## 2012-12-14 MED ORDER — OXYCODONE HCL 5 MG PO TABS: 5.0000 mg | ORAL_TABLET | ORAL | Status: DC | PRN

## 2012-12-14 MED ORDER — RIVAROXABAN 20 MG PO TABS: 20.0000 mg | ORAL_TABLET | Freq: Every day | ORAL | Status: DC

## 2012-12-14 MED ORDER — RIVAROXABAN 15 MG PO TABS: 15.0000 mg | ORAL_TABLET | Freq: Every day | ORAL | Status: DC

## 2012-12-14 MED ORDER — FREESTYLE SYSTEM KIT: 1.0000 | PACK | Status: DC | PRN

## 2012-12-14 MED ORDER — METOPROLOL TARTRATE 50 MG PO TABS: 50.0000 mg | ORAL_TABLET | Freq: Two times a day (BID) | ORAL | Status: DC

## 2012-12-14 NOTE — Progress Notes (Signed)
Pt discharge instructions and patient education completed. IV site d/c. Site WNL. Pt educated about xarelto. No further questions. D/C home. Dion Saucier

## 2012-12-14 NOTE — Discharge Summary (Signed)
Physician Discharge Summary  Victor Barnes ZOX:[id number] DOB: 1956/08/08 DOA: 12/11/2012  PCP: Victor Bradford, MD  Admit date: 12/11/2012 Discharge date: 12/14/2012  Time spent: 35 minutes  Recommendations for Outpatient Follow-up:  1. xarelto 15 mg BID x 21 days and then 20 mg daily 2. Check BS at home and bring to PCP   Discharge Diagnoses:  Principal Problem:   DVT (deep venous thrombosis) Active Problems:   Hypertension   Diabetes mellitus   PE (pulmonary embolism)   Atrial fibrillation with RVR   Discharge Condition: improved  Diet recommendation: cardiac/diabetic  Filed Weights   12/12/12 0549 12/13/12 0442 12/14/12 0557  Weight: 137.2 kg (302 lb 7.5 oz) 135.489 kg (298 lb 11.2 oz) 135.217 kg (298 lb 1.6 oz)    History of present illness:  Victor Barnes is a 57 y.o. male with a Past Medical History of atrial fibrillation/atrial flutter, numerous episodes of venous thromboembolism, noncompliance to medication including anticoagulation who presents today with the above noted complaint. Per patient, he woke up early this morning with chest discomfort, he was then brought to the emergency room where he was found to have tachycardia and a CT angiogram was positive for pulmonary embolism. The patient for the past 2-4 days he has had worsening swelling of his left lower extremity.  Patient claims that he has been off all of his medications including antihypertensives, diabetic medications and Xarelto since December of 2013 due to financial issues. He has not seen his primary care practitioner for more than a year.  Patient is now being admitted to the hospital for further evaluation and treatment.  During my evaluation, the patient's heart rate was in the 140s, it appeared that he was in atrial flutter-after getting 5 mg of IV Lopressor, his heart rate slowed down and the flutter waves were more apparent.  He denies any chest pain or shortness of breath during my  evaluation   Hospital Course:   DVT /PE  -Has been advised not to discontinue Xarelto this time  -currently on lovenox- change to xarelto at d/c  -Edema of left leg is improving  -compression stockings  Hypertension  -Continue metoprolol   Diabetes mellitus  -Hemoglobin A1c is elevated at 11.1 and therefore I have thoroughly discussed that he will need Lantus and NovoLog to help control his sugars-he is in agreement with this and therefore teaching has been initiated  -Will also need a glucometer prior to discharge  - titrate lantus up as outpatient  Atrial fibrillation with RVR  -Currently rate controlled  -2-D echo:Procedure narrative: Transthoracic echocardiography. Image quality was suboptimal. The study was technically difficult, as a result of body habitus. - Left ventricle: The cavity size was normal. Wall thickness was increased in a pattern of mild LVH. There was hypertrophy, with an appearance suggesting concentric remodeling (increased wall thickness with normal wall mass). Systolic function was mildly to moderately reduced. The estimated ejection fraction was in the range of 40% to 45%. Diffuse hypokinesis. Regional wall motion abnormalities cannot be excluded. - Left atrium: The atrium was mildly dilated. - Right ventricle: Not able to assess RV function due to poor visualization. Not able to assess RV size due to poor visualization. Impressions:  - An atrial fibrillation rhythm was noted on this study, making this study technically difficult for the assessment of cardiac function.  Calcified thyroid nodule  -Chronic and seen on CT      Procedures:  Echo- see above  Consultations:  none  Discharge  Exam: Filed Vitals:   12/13/12 1426 12/13/12 2113 12/14/12 0557 12/14/12 1320  BP: 132/76 143/84 120/77 138/65  Pulse: 95 88 78 77  Temp: 98.8 F (37.1 C) 99.1 F (37.3 C) 98.4 F (36.9 C) 98.9 F (37.2 C)  TempSrc: Oral Oral Oral Oral  Resp:  18 18 18 16   Height:      Weight:   135.217 kg (298 lb 1.6 oz)   SpO2: 98% 97% 98% 99%    General: A+Ox3, NAd Cardiovascular: irr Respiratory: clear but diminshed  Discharge Instructions      Discharge Orders   Future Orders Complete By Expires     Diet - low sodium heart healthy  As directed     Diet Carb Modified  As directed     Discharge instructions  As directed     Comments:      Wear compression stocking Elevate legs when not ambulating    Increase activity slowly  As directed         Medication List    TAKE these medications       glucose monitoring kit monitoring kit  1 each by Does not apply route as needed for other. Including lancets, test strips     ibuprofen 200 MG tablet  Commonly known as:  ADVIL,MOTRIN  Take 800 mg by mouth every 6 (six) hours as needed for pain.     Insulin Admin Supplies Kit  1 kit by Does not apply route once. Needles for lantus and novolog- QID dosing     insulin aspart 100 UNIT/ML injection  Commonly known as:  novoLOG  Inject 0-9 Units into the skin 3 (three) times daily before meals. 0-9 Units, Subcutaneous, 3 times daily with meals, First dose on Mon 12/11/12 at 1200  Correction coverage: Sensitive (thin, NPO, renal)  CBG < 70: implement hypoglycemia protocol  CBG 70 - 120: 0 units  CBG 121 - 150: 1 unit  CBG 151 - 200: 2 units  CBG 201 - 250: 3 units  CBG 251 - 300: 5 units  CBG 301 - 350: 7 units  CBG 351 - 400: 9 units     insulin glargine 100 UNIT/ML injection  Commonly known as:  LANTUS  Inject 0.15 mLs (15 Units total) into the skin daily.     lisinopril 5 MG tablet  Commonly known as:  PRINIVIL,ZESTRIL  Take 1 tablet (5 mg total) by mouth daily.     metoprolol 50 MG tablet  Commonly known as:  LOPRESSOR  Take 1 tablet (50 mg total) by mouth 2 (two) times daily.     oxyCODONE 5 MG immediate release tablet  Commonly known as:  Oxy IR/ROXICODONE  Take 1 tablet (5 mg total) by mouth every 4 (four)  hours as needed.     Rivaroxaban 15 MG Tabs tablet  Commonly known as:  XARELTO  Take 1 tablet (15 mg total) by mouth 2 (two) times daily.     Rivaroxaban 20 MG Tabs  Commonly known as:  XARELTO  Take 1 tablet (20 mg total) by mouth daily.     Rivaroxaban 15 MG Tabs tablet  Commonly known as:  XARELTO  Take 1 tablet (15 mg total) by mouth 2 (two) times daily.          The results of significant diagnostics from this hospitalization (including imaging, microbiology, ancillary and laboratory) are listed below for reference.    Significant Diagnostic Studies: Dg Chest 2 View  12/11/2012  *RADIOLOGY  REPORT*  Clinical Data: Chest pain in the right side and sternal region.  CHEST - 2 VIEW  Comparison: 07/16/2011  Findings: Shallow inspiration.  Mild cardiac enlargement with mild pulmonary vascular congestion.  No edema or consolidation.  No blunting of costophrenic angles.  No pneumothorax.  Mediastinal contours appear intact.  No significant change since previous study.  Degenerative changes in the spine.  IMPRESSION: Mild pulmonary vascular congestion without consolidation or edema.   Original Report Authenticated By: Burman Nieves, M.D.    Ct Angio Chest Pe W/cm &/or Wo Cm  12/11/2012  *RADIOLOGY REPORT*  Clinical Data: 57 year old male awakened by pain radiating from the right shoulder to the upper abdomen.  Shortness of breath.  Left lower extremity swelling.  History of DVT not currently anticoagulated.  CT ANGIOGRAPHY CHEST  Technique:  Multidetector CT imaging of the chest using the standard protocol during bolus administration of intravenous contrast. Multiplanar reconstructed images including MIPs were obtained and reviewed to evaluate the vascular anatomy.  Contrast: OMNIPAQUE IOHEXOL 350 MG/ML SOLN  Comparison: Chest radiographs 12/11/2012.  Chest CTA 07/16/2011.  Findings: Adequate contrast bolus timing in the pulmonary arterial tree.  No main or central pulmonary embolus.   Left upper lobe pulmonary arterial branches appear within normal limits.  Low density filling defect compatible with thrombus in the left lower lobe segmental branches (series 6 image 132, 137). Respiratory motion artifact at the left lung base. Additional respiratory motion artifact in the right upper lobe. Right upper lobe branches appear stable and within normal limits. However, there is there is low density clot in the right lower lobe segmental and subsegmental branches (series 6 image 165).  This may be worse in the right lower lobe posterior basal segment.  Major airways are patent except for atelectatic changes.  Trace layering right pleural effusion.  Lower lung volumes.  Mild respiratory motion and atelectasis.  No consolidation or confluent opacities suspicious for pulmonary infarct at this time.  Stable calcified left thyroid nodule.  Mediastinal lipomatosis. Scattered small mediastinal lymph nodes are not significantly changed.  No pericardial effusion.  Stable and negative visualized aorta.  Stable and negative visualized upper abdominal viscera.  Degenerative changes in the spine with multilevel endplate osteophytes. No acute osseous abnormality identified.  IMPRESSION: 1.  Positive for bilateral lower lobe pulmonary emboli. 2.  Trace right pleural effusion.  Mild pulmonary atelectasis. 3.  Stable 1 cm calcified left thyroid nodule since 2012.  Critical Value/emergent results were called by telephone at the time of interpretation on 12/11/2012 at 0728 hours to ED PA Pisciotta, who verbally acknowledged these results.   Original Report Authenticated By: Erskine Speed, M.D.     Microbiology: Recent Results (from the past 240 hour(s))  MRSA PCR SCREENING     Status: None   Collection Time    12/11/12  2:24 PM      Result Value Range Status   MRSA by PCR NEGATIVE  NEGATIVE Final   Comment:            The GeneXpert MRSA Assay (FDA     approved for NASAL specimens     only), is one component of a      comprehensive MRSA colonization     surveillance program. It is not     intended to diagnose MRSA     infection nor to guide or     monitor treatment for     MRSA infections.     Labs: Basic Metabolic Panel:  Recent Labs Lab 12/11/12 0334 12/12/12 0448  NA 135 135  K 3.9 3.8  CL 98 100  CO2 28 27  GLUCOSE 320* 239*  BUN 8 7  CREATININE 0.81 0.70  CALCIUM 9.5 8.7   Liver Function Tests:  Recent Labs Lab 12/11/12 0745 12/12/12 0448  AST 10 13  ALT 12 9  ALKPHOS 98 89  BILITOT 0.6 0.6  PROT 7.0 6.5  ALBUMIN 3.3* 2.9*   No results found for this basename: LIPASE, AMYLASE,  in the last 168 hours No results found for this basename: AMMONIA,  in the last 168 hours CBC:  Recent Labs Lab 12/11/12 0334 12/12/12 0448  WBC 13.6* 10.7*  HGB 14.8 13.3  HCT 42.1 38.3*  MCV 79.9 80.6  PLT 205 177   Cardiac Enzymes:  Recent Labs Lab 12/11/12 1057 12/11/12 1611 12/11/12 2215  TROPONINI <0.30 <0.30 <0.30   BNP: BNP (last 3 results) No results found for this basename: PROBNP,  in the last 8760 hours CBG:  Recent Labs Lab 12/13/12 1109 12/13/12 1607 12/13/12 2138 12/14/12 0557 12/14/12 1119  GLUCAP 204* 215* 191* 168* 201*       Signed:  Mohamed Portlock  [facility] 12/14/2012, 1:58 PM

## 2013-07-07 ENCOUNTER — Encounter: Payer: Self-pay | Admitting: Cardiology

## 2013-07-09 IMAGING — CT CT ANGIO CHEST
2 of 6 series · 18 of 46 positions shown · IV contrast (APPLIED)
Comparison: Chest radiographs 12/11/2012.  Chest CTA 07/16/2011.

CLINICAL DATA: 56-year-old male awakened by pain radiating from the
right shoulder to the upper abdomen.  Shortness of breath.  Left
lower extremity swelling.  History of DVT not currently
anticoagulated.

CT ANGIOGRAPHY CHEST
TECHNIQUE: Multidetector CT imaging of the chest using the
standard protocol during bolus administration of intravenous
contrast. Multiplanar reconstructed images including MIPs were
obtained and reviewed to evaluate the vascular anatomy.
Contrast: 100mL OMNIPAQUE IOHEXOL 350 MG/ML SOLN

[Series 6: pulm embolism 1.0 b25f thi · axial · 0.85mm/px · z∈[-312,-53]mm · 15 of 285 slices shown]
[im 13/285  lung]
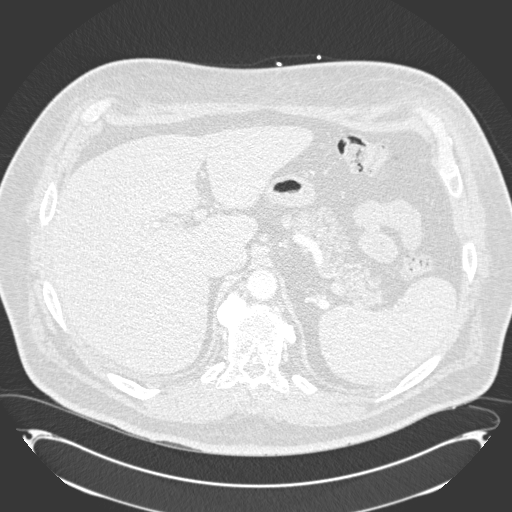
[im 38/285  soft-tissue]
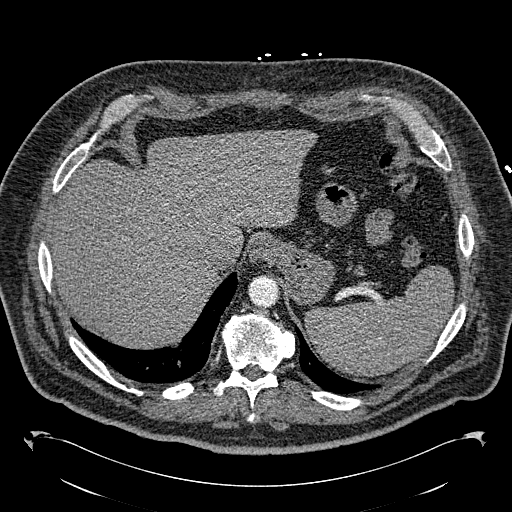
[im 50/285  lung]
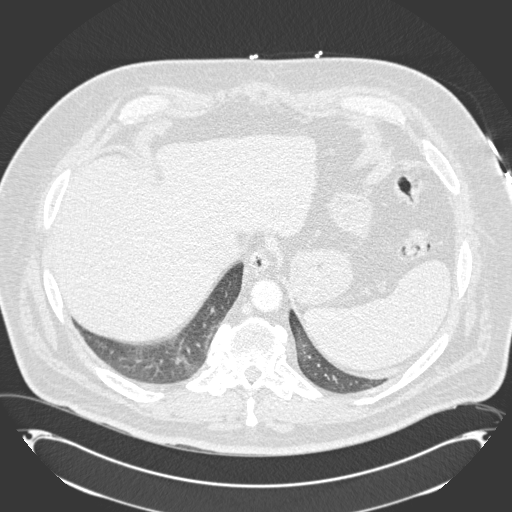
[im 75/285  soft-tissue]
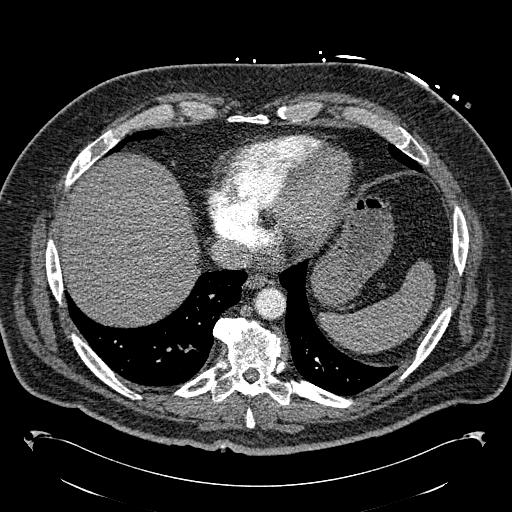
[im 87/285  lung]
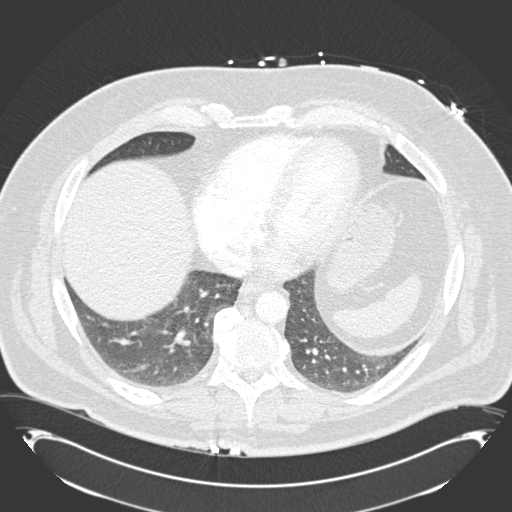
[im 112/285  soft-tissue]
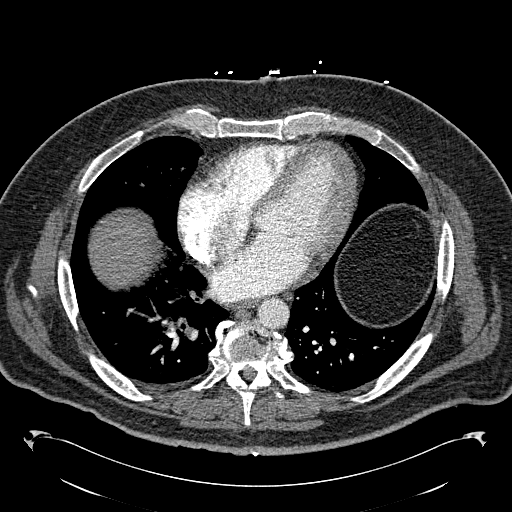
[im 124/285  lung]
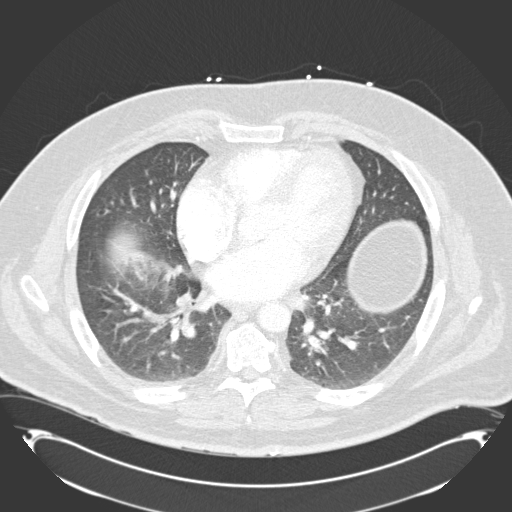
[im 149/285  soft-tissue]
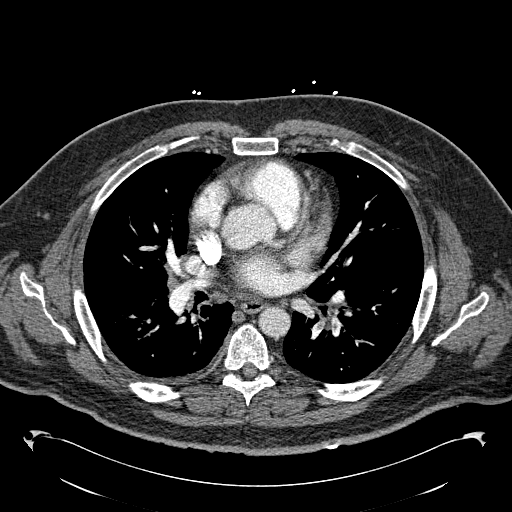
[im 161/285  lung]
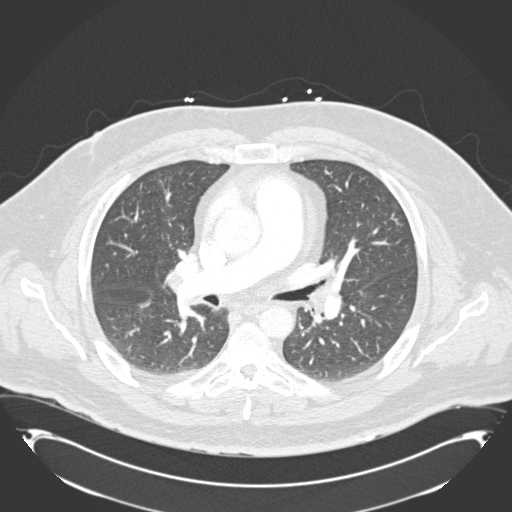
[im 173/285  soft-tissue]
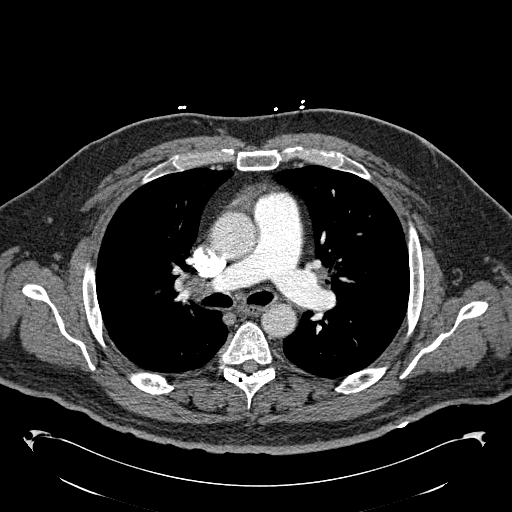
[im 198/285  lung]
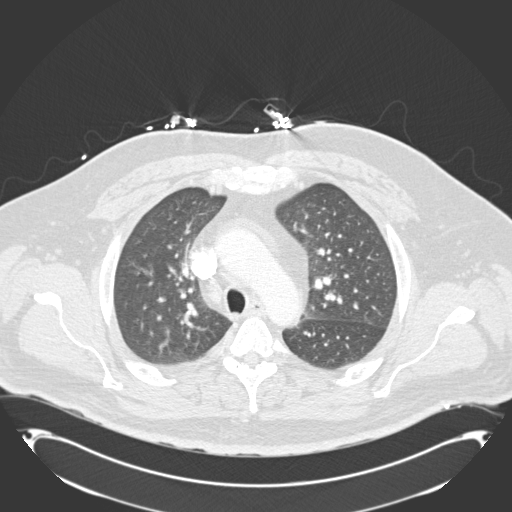
[im 210/285  soft-tissue]
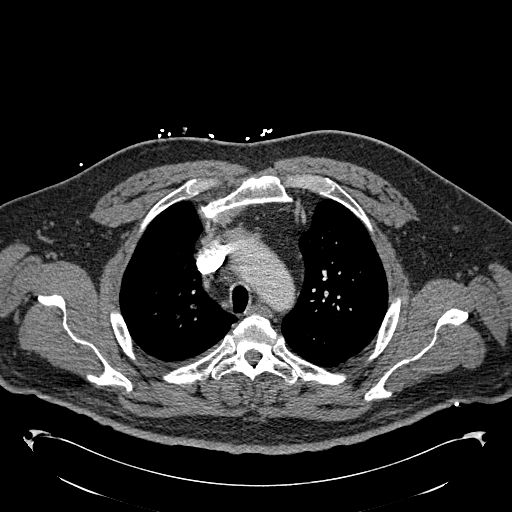
[im 235/285  lung]
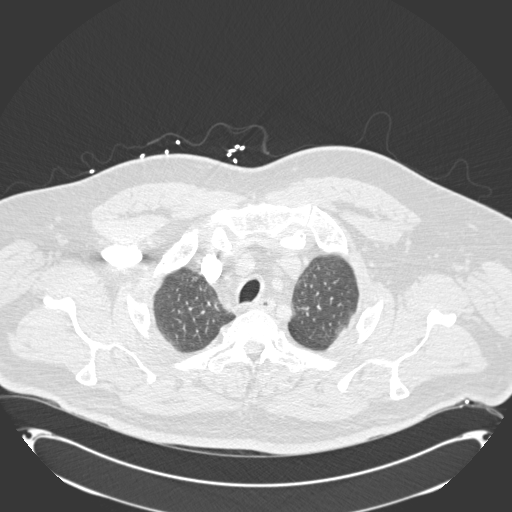
[im 247/285  soft-tissue]
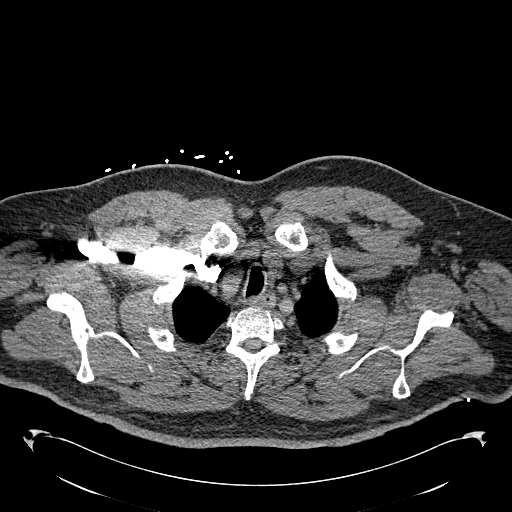
[im 272/285  lung]
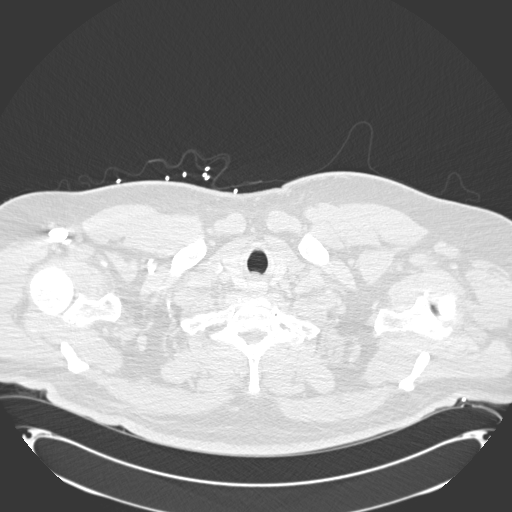

[Series 8: coronals · coronal · 0.70mm/px · 3 of 142 slices shown]
[im 36/142  soft-tissue]
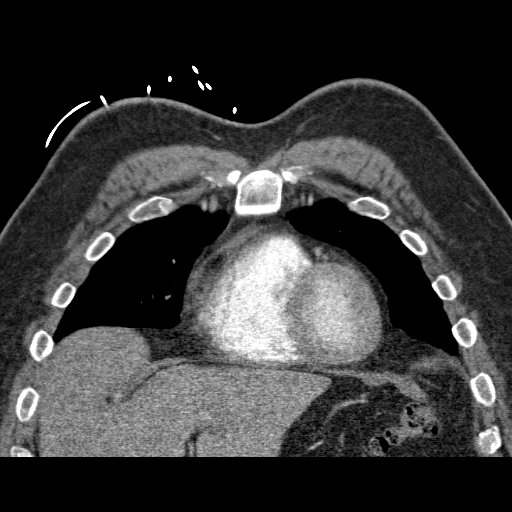
[im 71/142  soft-tissue]
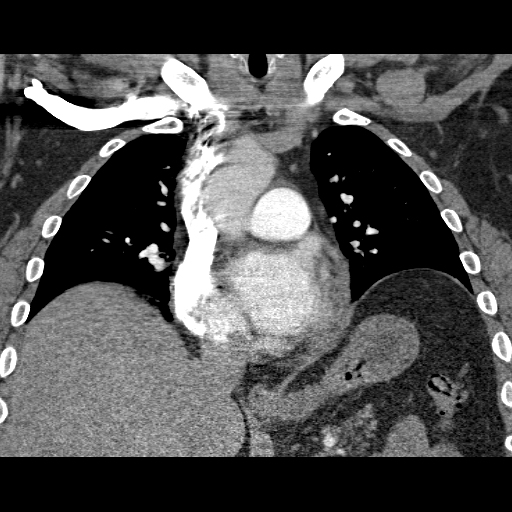
[im 106/142  soft-tissue]
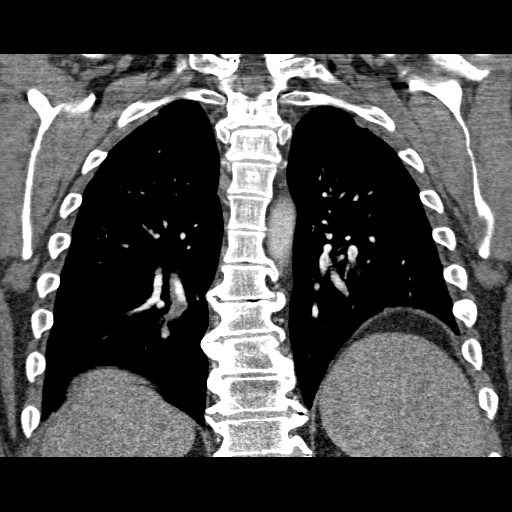

[18 of 46 positions shown; findings below may reference images not displayed]

FINDINGS: Adequate contrast bolus timing in the pulmonary arterial
tree.  No main or central pulmonary embolus.  Left upper lobe
pulmonary arterial branches appear within normal limits.

Low density filling defect compatible with thrombus in the left
lower lobe segmental branches (series 6 image 132, 137).
Respiratory motion artifact at the left lung base.
Additional respiratory motion artifact in the right upper lobe.
Right upper lobe branches appear stable and within normal limits.
However, there is there is low density clot in the right lower lobe
segmental and subsegmental branches (series 6 image 165).  This may
be worse in the right lower lobe posterior basal segment.

Major airways are patent except for atelectatic changes.  Trace
layering right pleural effusion.  Lower lung volumes.  Mild
respiratory motion and atelectasis.  No consolidation or confluent
opacities suspicious for pulmonary infarct at this time.

Stable calcified left thyroid nodule.  Mediastinal lipomatosis.
Scattered small mediastinal lymph nodes are not significantly
changed.  No pericardial effusion.  Stable and negative visualized
aorta.

Stable and negative visualized upper abdominal viscera.

Degenerative changes in the spine with multilevel endplate
osteophytes. No acute osseous abnormality identified.
IMPRESSION: 1.  Positive for bilateral lower lobe pulmonary emboli.
2.  Trace right pleural effusion.  Mild pulmonary atelectasis.
3.  Stable 1 cm calcified left thyroid nodule since 2892.

Critical Value/emergent results were called by telephone at the
time of interpretation on 12/11/2012 at 3752 hours to ED PA
Rajavinoth, who verbally acknowledged these results.

## 2013-07-11 ENCOUNTER — Other Ambulatory Visit: Payer: Self-pay | Admitting: *Deleted

## 2013-07-11 ENCOUNTER — Ambulatory Visit: Payer: 59 | Admitting: Cardiology

## 2013-07-13 ENCOUNTER — Encounter: Payer: Self-pay | Admitting: Cardiology

## 2014-02-08 ENCOUNTER — Telehealth: Payer: Self-pay

## 2014-02-11 ENCOUNTER — Other Ambulatory Visit: Payer: Self-pay

## 2014-02-11 MED ORDER — METOPROLOL SUCCINATE ER 100 MG PO TB24: 100.0000 mg | ORAL_TABLET | Freq: Every day | ORAL | Status: DC

## 2014-02-11 NOTE — Telephone Encounter (Signed)
Ecw shows pt taking Metoprolol Succinate 100 mg qd. Pt overdue for appt. He will need to r/s appt.

## 2014-03-22 ENCOUNTER — Encounter: Payer: Self-pay | Admitting: *Deleted

## 2014-10-04 ENCOUNTER — Emergency Department (HOSPITAL_BASED_OUTPATIENT_CLINIC_OR_DEPARTMENT_OTHER)
Admission: EM | Admit: 2014-10-04 | Discharge: 2014-10-05 | Disposition: A | Discharge status: Home / Self Care | Payer: BLUE CROSS/BLUE SHIELD | Attending: Emergency Medicine | Admitting: Emergency Medicine

## 2014-10-04 ENCOUNTER — Encounter (HOSPITAL_BASED_OUTPATIENT_CLINIC_OR_DEPARTMENT_OTHER): Payer: Self-pay | Admitting: Emergency Medicine

## 2014-10-04 DIAGNOSIS — Z79899 Other long term (current) drug therapy: Secondary | ICD-10-CM | POA: Diagnosis not present

## 2014-10-04 DIAGNOSIS — E119 Type 2 diabetes mellitus without complications: Secondary | ICD-10-CM | POA: Diagnosis not present

## 2014-10-04 DIAGNOSIS — L03317 Cellulitis of buttock: Secondary | ICD-10-CM | POA: Diagnosis not present

## 2014-10-04 DIAGNOSIS — Z87891 Personal history of nicotine dependence: Secondary | ICD-10-CM | POA: Insufficient documentation

## 2014-10-04 DIAGNOSIS — Z91148 Patient's other noncompliance with medication regimen for other reason: Secondary | ICD-10-CM

## 2014-10-04 DIAGNOSIS — I10 Essential (primary) hypertension: Secondary | ICD-10-CM | POA: Insufficient documentation

## 2014-10-04 DIAGNOSIS — Z86718 Personal history of other venous thrombosis and embolism: Secondary | ICD-10-CM | POA: Insufficient documentation

## 2014-10-04 DIAGNOSIS — Z794 Long term (current) use of insulin: Secondary | ICD-10-CM | POA: Insufficient documentation

## 2014-10-04 DIAGNOSIS — L0390 Cellulitis, unspecified: Secondary | ICD-10-CM

## 2014-10-04 DIAGNOSIS — Z86711 Personal history of pulmonary embolism: Secondary | ICD-10-CM | POA: Diagnosis not present

## 2014-10-04 DIAGNOSIS — Z9119 Patient's noncompliance with other medical treatment and regimen: Secondary | ICD-10-CM | POA: Insufficient documentation

## 2014-10-04 DIAGNOSIS — Z7901 Long term (current) use of anticoagulants: Secondary | ICD-10-CM | POA: Diagnosis not present

## 2014-10-04 DIAGNOSIS — L0231 Cutaneous abscess of buttock: Secondary | ICD-10-CM | POA: Diagnosis present

## 2014-10-04 DIAGNOSIS — L0291 Cutaneous abscess, unspecified: Secondary | ICD-10-CM

## 2014-10-04 DIAGNOSIS — Z9114 Patient's other noncompliance with medication regimen: Secondary | ICD-10-CM

## 2014-10-04 DIAGNOSIS — Z8669 Personal history of other diseases of the nervous system and sense organs: Secondary | ICD-10-CM | POA: Diagnosis not present

## 2014-10-04 MED ORDER — LIDOCAINE-EPINEPHRINE 2 %-1:100000 IJ SOLN
INTRAMUSCULAR | Status: AC
  Filled 2014-10-04: qty 1

## 2014-10-04 MED ORDER — LIDOCAINE-EPINEPHRINE (PF) 2 %-1:200000 IJ SOLN
INTRAMUSCULAR | Status: AC
  Filled 2014-10-04: qty 20

## 2014-10-04 MED ORDER — CLINDAMYCIN PHOSPHATE 600 MG/50ML IV SOLN
600.0000 mg | Freq: Once | INTRAVENOUS | Status: AC
  Administered 2014-10-05: 600 mg via INTRAVENOUS
  Filled 2014-10-04: qty 50

## 2014-10-04 MED ORDER — LIDOCAINE-EPINEPHRINE 2 %-1:100000 IJ SOLN
20.0000 mL | Freq: Once | INTRAMUSCULAR | Status: AC
  Administered 2014-10-04: 10 mL

## 2014-10-04 NOTE — ED Provider Notes (Addendum)
CSN: [id number]     Arrival date & time 10/04/14  2312 History  This chart was scribed for Wynetta Fines, MD by Rayfield Citizen, ED Scribe. This patient was seen in room MH04/MH04 and the patient's care was started at 11:34 PM.    Chief Complaint  Patient presents with  . Abscess   The history is provided by the patient. No language interpreter was used.     HPI Comments: Victor Barnes is a 59 y.o. male with past medical history of HTN, DM, afib, PE and DVT who presents to the Emergency Department complaining of 4 days of gradually worsening abscess to the right side of his gluteal fold. His pain is rated 0/10 at present; rated 6/10 with applied pressure. He also reports chills.   Patient has a history of recurrent RLE DVT; he reports he is not on Xarelto at present. Per nurse, patient stopped taking all of his medications 10 months ago "to detox himself". He admits to this, and acknowledged he should be compliant. Patient refused to have his CBG checked.   He was advised of the importance to take his medications as prescribed in light of his history of diabetes, DVT and atrial fibrillation and their potential to cause potentially life-threatening sequelae. Will provide refills pending followup with PCP.  Past Medical History  Diagnosis Date  . Hypertension   . Sleep apnea   . Venous stasis   . DVT (deep venous thrombosis)     Recurrent RLE DVT, supposed to be on Coumadin but h/o non-compliance   . Cellulitis   . Atrial fibrillation or flutter     Should be on Coumadin but h/o non-compliance; started Sotalol 08/2009  . Diabetes mellitus   . Pulmonary embolism    Past Surgical History  Procedure Laterality Date  . Leg surgery     Family History  Problem Relation Age of Onset  . Hypertension Mother   . Diabetes Mellitus I Father   . Hypertension Paternal Grandmother   . Heart attack Paternal Grandmother    History  Substance Use Topics  . Smoking status: Former Smoker     Quit date: 03/23/1987  . Smokeless tobacco: Not on file  . Alcohol Use: Yes     Comment: occ    Review of Systems  A complete 10 system review of systems was obtained and all systems are negative except as noted in the HPI and PMH.    Allergies  Crestor; Lipitor; and Pravastatin  Home Medications   Prior to Admission medications   Medication Sig Start Date End Date Taking? Authorizing Provider  glucose monitoring kit (FREESTYLE) monitoring kit 1 each by Does not apply route as needed for other. Including lancets, test strips 12/14/12   Geradine Girt, DO  ibuprofen (ADVIL,MOTRIN) 200 MG tablet Take 800 mg by mouth every 6 (six) hours as needed for pain.    Historical Provider, MD  Insulin Admin Supplies KIT 1 kit by Does not apply route once. Needles for lantus and novolog- QID dosing 12/14/12   Geradine Girt, DO  insulin aspart (NOVOLOG) 100 UNIT/ML injection Inject 0-9 Units into the skin 3 (three) times daily before meals. 0-9 Units, Subcutaneous, 3 times daily with meals, First dose on Mon 12/11/12 at 1200 Correction coverage: Sensitive (thin, NPO, renal) CBG < 70: implement hypoglycemia protocol CBG 70 - 120: 0 units CBG 121 - 150: 1 unit CBG 151 - 200: 2 units CBG 201 - 250: 3 units CBG  251 - 300: 5 units CBG 301 - 350: 7 units CBG 351 - 400: 9 units 12/14/12   Jessica U Vann, DO  insulin glargine (LANTUS) 100 UNIT/ML injection Inject 0.15 mLs (15 Units total) into the skin daily. 12/14/12   Geradine Girt, DO  lisinopril-hydrochlorothiazide (PRINZIDE,ZESTORETIC) 10-12.5 MG per tablet Take 1 tablet by mouth daily.    Historical Provider, MD  metoprolol succinate (TOPROL-XL) 100 MG 24 hr tablet Take 1 tablet (100 mg total) by mouth daily. 02/11/14   Candee Furbish, MD  oxyCODONE (OXY IR/ROXICODONE) 5 MG immediate release tablet Take 1 tablet (5 mg total) by mouth every 4 (four) hours as needed. 12/14/12   Geradine Girt, DO  Rivaroxaban (XARELTO) 20 MG TABS Take 1 tablet (20 mg total) by  mouth daily. 12/14/12   Jessica U Vann, DO   BP 132/89 mmHg  Pulse 140  Temp(Src) 98.7 F (37.1 C) (Oral)  Resp 18  Ht 6' (1.829 m)  Wt 270 lb (122.471 kg)  BMI 36.61 kg/m2  SpO2 98%   Physical Exam  Nursing note and vitals reviewed. General: Well-developed, obese male in no acute distress; appearance consistent with age of record HENT: normocephalic; atraumatic Eyes: pupils equal, round and reactive to light; extraocular muscles intact Neck: supple Heart: regular rate and rhythm; tachycardia Lungs: clear to auscultation bilaterally Abdomen: soft; nondistended; nontender Extremities: No deformity; full range of motion; pulses normal; her legs and compression stockings Neurologic: Awake, alert and oriented; motor function intact in all extremities and symmetric; no facial droop Skin: Warm and dry; abscess to medial right buttock with an area of central necrosis and some surrounding cellulitis  Psychiatric: Normal mood and affect  ED Course  Procedures   DIAGNOSTIC STUDIES: Oxygen Saturation is 98% on RA, normal by my interpretation.    COORDINATION OF CARE: 11:43 PM Discussed treatment plan with pt at bedside and pt agreed to plan.  INCISION AND DRAINAGE Performed by: Shanon Rosser L Consent: Verbal consent obtained. Risks and benefits: risks, benefits and alternatives were discussed Type: abscess  Body area: Medial right buttock  Anesthesia: local infiltration  Incision was made with a scalpel.  Local anesthetic: lidocaine 2 % with epinephrine  Anesthetic total: 6 ml  Complexity: complex Blunt dissection to break up loculations  Drainage: purulent  Drainage amount: Small; non-foul-smelling  Packing material: None   Patient tolerance: Patient tolerated the procedure well with no immediate complications.   Clindamycin 680m IV given for cellulitis, which should cover gram positives as well as anaerobes.  MDM  I personally performed the services described in  this documentation, which was scribed in my presence. The recorded information has been reviewed and is accurate.     JWynetta Fines MD 10/05/14 [id number] JWynetta Fines MD 10/05/14 [id number]

## 2014-10-04 NOTE — ED Notes (Signed)
MD at bedside. 

## 2014-10-04 NOTE — ED Notes (Signed)
Abscess to right side of gluteal fold.

## 2014-10-05 MED ORDER — CLINDAMYCIN HCL 300 MG PO CAPS: 300.0000 mg | ORAL_CAPSULE | Freq: Four times a day (QID) | ORAL | Status: DC

## 2014-10-05 MED ORDER — INSULIN ASPART 100 UNIT/ML ~~LOC~~ SOLN: 0.0000 [IU] | Freq: Three times a day (TID) | SUBCUTANEOUS | Status: DC

## 2014-10-05 MED ORDER — METOPROLOL SUCCINATE ER 100 MG PO TB24: 100.0000 mg | ORAL_TABLET | Freq: Every day | ORAL | Status: DC

## 2014-10-05 MED ORDER — RIVAROXABAN 20 MG PO TABS: 20.0000 mg | ORAL_TABLET | Freq: Every day | ORAL | Status: DC

## 2014-10-05 MED ORDER — OXYCODONE HCL 5 MG PO TABS: 5.0000 mg | ORAL_TABLET | ORAL | Status: DC | PRN

## 2014-10-05 MED ORDER — LISINOPRIL-HYDROCHLOROTHIAZIDE 10-12.5 MG PO TABS: 1.0000 | ORAL_TABLET | Freq: Every day | ORAL | Status: DC

## 2014-10-05 MED ORDER — INSULIN GLARGINE 100 UNIT/ML ~~LOC~~ SOLN: 15.0000 [IU] | Freq: Every day | SUBCUTANEOUS | Status: DC

## 2014-10-05 NOTE — ED Notes (Signed)
Abscess drained by edp.  Gauze dressing applied with tape.  Pt tolerated procedure well.

## 2014-10-05 NOTE — Discharge Instructions (Signed)

## 2014-10-07 ENCOUNTER — Telehealth (HOSPITAL_BASED_OUTPATIENT_CLINIC_OR_DEPARTMENT_OTHER): Payer: Self-pay | Admitting: Emergency Medicine

## 2014-10-07 LAB — CULTURE, ROUTINE-ABSCESS: GRAM STAIN: NONE SEEN

## 2014-10-07 NOTE — Telephone Encounter (Signed)
Post ED Visit - Positive Culture Follow-up  Culture report reviewed by antimicrobial stewardship pharmacist: []  Wes Dulaney, Pharm.D., BCPS [x]  Celedonio MiyamotoJeremy Frens, Pharm.D., BCPS []  Georgina PillionElizabeth Martin, Pharm.D., BCPS []  South LakesMinh Pham, [address]Ph[address]D., BCPS, AAHIVP []  Estella HuskMichelle Turner, Pharm.D., BCPS, AAHIVP []  Elder CyphersLorie Poole, [address]Pharm[address]D., BCPS  Positive abcess culture group B strep Treated with clindamycin, organism sensitive to the same and no further patient follow-up is required at this time.  Berle MullMiller, Kydan Shanholtzer 10/07/2014, 9:59 AM

## 2014-11-07 ENCOUNTER — Encounter (HOSPITAL_BASED_OUTPATIENT_CLINIC_OR_DEPARTMENT_OTHER): Payer: Self-pay | Attending: Internal Medicine

## 2014-11-22 ENCOUNTER — Ambulatory Visit (INDEPENDENT_AMBULATORY_CARE_PROVIDER_SITE_OTHER): Payer: BLUE CROSS/BLUE SHIELD | Admitting: Cardiology

## 2014-11-22 ENCOUNTER — Encounter: Payer: Self-pay | Admitting: Cardiology

## 2014-11-22 VITALS — BP 108/82 | HR 91 | Ht 73.0 in | Wt 308.0 lb

## 2014-11-22 DIAGNOSIS — I10 Essential (primary) hypertension: Secondary | ICD-10-CM | POA: Diagnosis not present

## 2014-11-22 DIAGNOSIS — Z7901 Long term (current) use of anticoagulants: Secondary | ICD-10-CM | POA: Insufficient documentation

## 2014-11-22 DIAGNOSIS — I4819 Other persistent atrial fibrillation: Secondary | ICD-10-CM

## 2014-11-22 DIAGNOSIS — I481 Persistent atrial fibrillation: Secondary | ICD-10-CM | POA: Diagnosis not present

## 2014-11-22 NOTE — Patient Instructions (Signed)
The current medical regimen is effective;  continue present plan and medications.  Follow up in 6 months with Dr. Skains.  You will receive a letter in the mail 2 months before you are due.  Please call us when you receive this letter to schedule your follow up appointment.  Thank you for choosing [facility]!!     

## 2014-11-22 NOTE — Progress Notes (Signed)
Cardiology Office Note   Date:  11/22/2014   ID:  [place] CellarDouglas M Barnes, DOB 02/12/1956, MRN [id number]  PCP:  Victor BradfordWHITE,CYNTHIA S, MD  Cardiologist:   Victor SchultzSKAINS, MARK, MD       History of Present Illness: Victor Barnes is a 59 y.o. male who presents for follow up. Prior PE on Xarelto. HTN on ACE/HCT. DM on Lantus.   Has had a history of atrial flutter rate controlled asymptomatic in the past. He has also had a right leg DVT in 2008 treated with Coumadin for 3 months with recurrent DVT in 2010, life time anticoagulation wearing support hose. Unfortunately had a pulmonary embolism in November 2012 noncompliant with anticoagulation as well as April 2014. He has refused Pneumovax and flu shot in the past. Prior echocardiogram showed EF of 40-45%. He is also battling morbid obesity with a previous weight of 303 pounds. Apparently has missed appointments in the past to this office.  Was in emergency department on 10/04/14 complaining of 4 days of worsening abscess to right side of the gluteal fold. Once again, he took himself off of all of his medications.   Mild SOB, no CP.   EKG from 12/21/12 shows right bundle branch block with heart rate of 146, likely atrial flutter underlying.  He is currently taking his medication, anticoagulation as prescribed. No symptoms, no significant bleeding. Mild shortness of breath with activity. No chest pain.  Echocardiogram 12/12/12 shows the following:  ------------------------------------------------------------ LV EF: 40% -  45%  ------------------------------------------------------------ Indications:   Pulmonary embolus 415.19.  ------------------------------------------------------------ History:  PMH:  Atrial fibrillation. Risk factors: Hypertension. Diabetes mellitus.  ------------------------------------------------------------ Study Conclusions  - Procedure narrative: Transthoracic echocardiography. Image quality was suboptimal. The  study was technically difficult, as a result of body habitus. - Left ventricle: The cavity size was normal. Wall thickness was increased in a pattern of mild LVH. There was hypertrophy, with an appearance suggesting concentric remodeling (increased wall thickness with normal wall mass). Systolic function was mildly to moderately reduced. The estimated ejection fraction was in the range of 40% to 45%. Diffuse hypokinesis. Regional wall motion abnormalities cannot be excluded. - Left atrium: The atrium was mildly dilated. - Right ventricle: Not able to assess RV function due to poor visualization. Not able to assess RV size due to poor visualization.  Labs from 12/12/12 showed creatinine of 0.7, hemoglobin 13.3 and glucose of 239.  Past Medical History  Diagnosis Date  . Hypertension   . Sleep apnea   . Venous stasis   . DVT (deep venous thrombosis)     Recurrent RLE DVT, supposed to be on Coumadin but h/o non-compliance   . Cellulitis   . Atrial fibrillation or flutter     Should be on Coumadin but h/o non-compliance; started Sotalol 08/2009  . Diabetes mellitus   . Pulmonary embolism   . Hyperlipidemia   . OSA on CPAP   . Myocardiopathy     Past Surgical History  Procedure Laterality Date  . Leg surgery       Current Outpatient Prescriptions  Medication Sig Dispense Refill  . insulin glargine (LANTUS) 100 UNIT/ML injection Inject 0.15 mLs (15 Units total) into the skin daily. (Patient taking differently: Inject 20 Units into the skin daily. ) 10 mL 1  . lisinopril (PRINIVIL,ZESTRIL) 5 MG tablet Take 5 mg by mouth daily.  1  . metoprolol succinate (TOPROL-XL) 50 MG 24 hr tablet Take 50 mg by mouth daily.  1  . Carlena HurlXARELTO  20 MG TABS tablet Take 20 mg by mouth daily.  0   No current facility-administered medications for this visit.    Allergies:   Crestor; Lipitor; and Pravastatin    Social History:  The patient  reports that he quit smoking about 27  years ago. He does not have any smokeless tobacco history on file. He reports that he drinks alcohol. He reports that he does not use illicit drugs.   Family History:  The patient'Barnes family history includes Diabetes Mellitus I in his father; Heart attack in his paternal grandmother; Hypertension in his mother and paternal grandmother.    ROS:  Please see the history of present illness.   Otherwise, review of systems are positive for none.   All other systems are reviewed and negative.    PHYSICAL EXAM: VS:  BP 108/82 mmHg  Pulse 91  Ht  (1.854 m)  Wt 308 lb (139.708 kg)  BMI 40.64 kg/m2 , BMI Body mass index is 40.64 kg/(m^2). GEN: Well nourished, well developed, in no acute distress HEENT: normal Neck: no JVD, carotid bruits, or masses Cardiac: RRR; no murmurs, rubs, or gallops,no edema  Respiratory:  clear to auscultation bilaterally, normal work of breathing GI: soft, nontender, nondistended, + BS MS: no deformity or atrophy Skin: warm and dry, no rash Neuro:  Strength and sensation are intact Psych: euthymic mood, full affect   EKG:  EKG is ordered today. The ekg ordered today demonstrates atrial fibrillation heart rate 91, right bundle branch block Prior EKGs as described above.  Recent Labs: No results found for requested labs within last 365 days.    Lipid Panel No results found for: CHOL, TRIG, HDL, CHOLHDL, VLDL, LDLCALC, LDLDIRECT    Wt Readings from Last 3 Encounters:  11/22/14 308 lb (139.708 kg)  10/04/14 270 lb (122.471 kg)  12/14/12 298 lb 1.6 oz (135.217 kg)      Other studies Reviewed: Additional studies/ records that were reviewed today include: Review of emergency room records.. Review of the above records demonstrates: Prior office records reviewed as well. Stress test, echo can't gram, lab work.   ASSESSMENT AND PLAN:  1.  Atrial flutter-reason for consultative visit. Has underlying right bundle branch block with current atrial  fibrillation. He has had atrial flutter in the past. His heart rate currently is 91 bpm. Adequately rate controlled. Given his noncompliance, we will continue with rate control medication. Also, I do not think that he would be able to successfully remain in sinus rhythm. Once again, he is asymptomatic. My last visit to see him was on 01/09/13. He has been asymptomatic from his atrial flutter perspective. Strongly encouraged continued use of anticoagulation. Right bundle branch block is unchanged from prior. Prior ejection fraction mildly reduced likely in part secondary to tachycardia associated with atrial flutter. Prior nuclear stress test 09/2009 was normal, by Dr. Amil Amen. EF at that time was 60%.  2. Chronic anti-coagulation-encouraged him to have blood work done every 6 months, creatinine, hemoglobin. He wanted to discuss with me risks and benefits of medication. We went over this. He was worried from commercials he was seeing on TV. We discussed this at length.  3. Right bundle branch block-chronic. No changes. No high-risk symptoms such as syncope. Continue with metoprolol.  4. 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 He is asymptomatic currently. I will forego echocardiogram at this time.     Current medicines are reviewed at length with the patient today.  The patient does not have  concerns regarding medicines.  The following changes have been made:  no change  Labs/ tests ordered today include:   Orders Placed This Encounter  Procedures  . EKG 12-Lead     Disposition:   FU with Skains in 6 month   Signed, Victor Schultz, MD  11/22/2014 8:52 AM    [facility] [address] [place], [place], [place]  [address] Phone: [phone]; Fax: [phone]

## 2015-01-14 ENCOUNTER — Encounter (HOSPITAL_BASED_OUTPATIENT_CLINIC_OR_DEPARTMENT_OTHER): Payer: BLUE CROSS/BLUE SHIELD | Attending: General Surgery

## 2015-01-14 DIAGNOSIS — I482 Chronic atrial fibrillation: Secondary | ICD-10-CM | POA: Insufficient documentation

## 2015-01-14 DIAGNOSIS — E1140 Type 2 diabetes mellitus with diabetic neuropathy, unspecified: Secondary | ICD-10-CM | POA: Insufficient documentation

## 2015-01-14 DIAGNOSIS — I10 Essential (primary) hypertension: Secondary | ICD-10-CM | POA: Insufficient documentation

## 2015-01-14 DIAGNOSIS — I87311 Chronic venous hypertension (idiopathic) with ulcer of right lower extremity: Secondary | ICD-10-CM | POA: Insufficient documentation

## 2015-01-14 DIAGNOSIS — Z86718 Personal history of other venous thrombosis and embolism: Secondary | ICD-10-CM | POA: Diagnosis not present

## 2015-01-14 DIAGNOSIS — L97911 Non-pressure chronic ulcer of unspecified part of right lower leg limited to breakdown of skin: Secondary | ICD-10-CM | POA: Insufficient documentation

## 2015-01-14 DIAGNOSIS — E11622 Type 2 diabetes mellitus with other skin ulcer: Secondary | ICD-10-CM | POA: Diagnosis not present

## 2015-01-14 DIAGNOSIS — Z794 Long term (current) use of insulin: Secondary | ICD-10-CM | POA: Diagnosis not present

## 2015-01-14 DIAGNOSIS — I739 Peripheral vascular disease, unspecified: Secondary | ICD-10-CM | POA: Diagnosis not present

## 2015-01-21 ENCOUNTER — Encounter (HOSPITAL_BASED_OUTPATIENT_CLINIC_OR_DEPARTMENT_OTHER): Payer: BLUE CROSS/BLUE SHIELD | Attending: General Surgery

## 2015-01-21 DIAGNOSIS — E1140 Type 2 diabetes mellitus with diabetic neuropathy, unspecified: Secondary | ICD-10-CM | POA: Insufficient documentation

## 2015-01-21 DIAGNOSIS — L97811 Non-pressure chronic ulcer of other part of right lower leg limited to breakdown of skin: Secondary | ICD-10-CM | POA: Diagnosis not present

## 2015-01-21 DIAGNOSIS — I482 Chronic atrial fibrillation: Secondary | ICD-10-CM | POA: Diagnosis not present

## 2015-01-21 DIAGNOSIS — E11622 Type 2 diabetes mellitus with other skin ulcer: Secondary | ICD-10-CM | POA: Diagnosis present

## 2015-01-21 DIAGNOSIS — Z87891 Personal history of nicotine dependence: Secondary | ICD-10-CM | POA: Diagnosis not present

## 2015-01-21 DIAGNOSIS — I872 Venous insufficiency (chronic) (peripheral): Secondary | ICD-10-CM | POA: Insufficient documentation

## 2015-02-11 DIAGNOSIS — E1140 Type 2 diabetes mellitus with diabetic neuropathy, unspecified: Secondary | ICD-10-CM | POA: Diagnosis not present

## 2015-02-11 DIAGNOSIS — I872 Venous insufficiency (chronic) (peripheral): Secondary | ICD-10-CM | POA: Diagnosis not present

## 2015-02-11 DIAGNOSIS — E11622 Type 2 diabetes mellitus with other skin ulcer: Secondary | ICD-10-CM | POA: Diagnosis not present

## 2015-02-11 DIAGNOSIS — L97811 Non-pressure chronic ulcer of other part of right lower leg limited to breakdown of skin: Secondary | ICD-10-CM | POA: Diagnosis not present

## 2015-02-18 ENCOUNTER — Encounter (HOSPITAL_BASED_OUTPATIENT_CLINIC_OR_DEPARTMENT_OTHER): Payer: BLUE CROSS/BLUE SHIELD | Attending: General Surgery

## 2015-02-18 DIAGNOSIS — I739 Peripheral vascular disease, unspecified: Secondary | ICD-10-CM | POA: Insufficient documentation

## 2015-02-18 DIAGNOSIS — L97201 Non-pressure chronic ulcer of unspecified calf limited to breakdown of skin: Secondary | ICD-10-CM | POA: Diagnosis not present

## 2015-02-18 DIAGNOSIS — I499 Cardiac arrhythmia, unspecified: Secondary | ICD-10-CM | POA: Diagnosis not present

## 2015-02-18 DIAGNOSIS — E1140 Type 2 diabetes mellitus with diabetic neuropathy, unspecified: Secondary | ICD-10-CM | POA: Insufficient documentation

## 2015-02-18 DIAGNOSIS — Z86718 Personal history of other venous thrombosis and embolism: Secondary | ICD-10-CM | POA: Insufficient documentation

## 2015-02-18 DIAGNOSIS — G4730 Sleep apnea, unspecified: Secondary | ICD-10-CM | POA: Insufficient documentation

## 2015-02-18 DIAGNOSIS — I10 Essential (primary) hypertension: Secondary | ICD-10-CM | POA: Insufficient documentation

## 2015-02-18 DIAGNOSIS — M1990 Unspecified osteoarthritis, unspecified site: Secondary | ICD-10-CM | POA: Diagnosis not present

## 2015-02-25 DIAGNOSIS — I499 Cardiac arrhythmia, unspecified: Secondary | ICD-10-CM | POA: Diagnosis not present

## 2015-02-25 DIAGNOSIS — E1140 Type 2 diabetes mellitus with diabetic neuropathy, unspecified: Secondary | ICD-10-CM | POA: Diagnosis not present

## 2015-02-25 DIAGNOSIS — L97201 Non-pressure chronic ulcer of unspecified calf limited to breakdown of skin: Secondary | ICD-10-CM | POA: Diagnosis not present

## 2015-02-25 DIAGNOSIS — G4730 Sleep apnea, unspecified: Secondary | ICD-10-CM | POA: Diagnosis not present

## 2015-03-04 DIAGNOSIS — E1140 Type 2 diabetes mellitus with diabetic neuropathy, unspecified: Secondary | ICD-10-CM | POA: Diagnosis not present

## 2015-03-04 DIAGNOSIS — I499 Cardiac arrhythmia, unspecified: Secondary | ICD-10-CM | POA: Diagnosis not present

## 2015-03-04 DIAGNOSIS — G4730 Sleep apnea, unspecified: Secondary | ICD-10-CM | POA: Diagnosis not present

## 2015-03-04 DIAGNOSIS — L97201 Non-pressure chronic ulcer of unspecified calf limited to breakdown of skin: Secondary | ICD-10-CM | POA: Diagnosis not present

## 2015-03-11 DIAGNOSIS — I499 Cardiac arrhythmia, unspecified: Secondary | ICD-10-CM | POA: Diagnosis not present

## 2015-03-11 DIAGNOSIS — G4730 Sleep apnea, unspecified: Secondary | ICD-10-CM | POA: Diagnosis not present

## 2015-03-11 DIAGNOSIS — L97201 Non-pressure chronic ulcer of unspecified calf limited to breakdown of skin: Secondary | ICD-10-CM | POA: Diagnosis not present

## 2015-03-11 DIAGNOSIS — E1140 Type 2 diabetes mellitus with diabetic neuropathy, unspecified: Secondary | ICD-10-CM | POA: Diagnosis not present

## 2015-03-18 ENCOUNTER — Encounter (HOSPITAL_BASED_OUTPATIENT_CLINIC_OR_DEPARTMENT_OTHER): Payer: BLUE CROSS/BLUE SHIELD | Attending: General Surgery

## 2015-03-18 DIAGNOSIS — G4730 Sleep apnea, unspecified: Secondary | ICD-10-CM | POA: Insufficient documentation

## 2015-03-18 DIAGNOSIS — E1151 Type 2 diabetes mellitus with diabetic peripheral angiopathy without gangrene: Secondary | ICD-10-CM | POA: Insufficient documentation

## 2015-03-18 DIAGNOSIS — Z86718 Personal history of other venous thrombosis and embolism: Secondary | ICD-10-CM | POA: Insufficient documentation

## 2015-03-18 DIAGNOSIS — E11622 Type 2 diabetes mellitus with other skin ulcer: Secondary | ICD-10-CM | POA: Insufficient documentation

## 2015-03-18 DIAGNOSIS — L97811 Non-pressure chronic ulcer of other part of right lower leg limited to breakdown of skin: Secondary | ICD-10-CM | POA: Insufficient documentation

## 2015-03-18 DIAGNOSIS — E1140 Type 2 diabetes mellitus with diabetic neuropathy, unspecified: Secondary | ICD-10-CM | POA: Insufficient documentation

## 2015-03-18 DIAGNOSIS — M1990 Unspecified osteoarthritis, unspecified site: Secondary | ICD-10-CM | POA: Insufficient documentation

## 2015-03-18 DIAGNOSIS — I499 Cardiac arrhythmia, unspecified: Secondary | ICD-10-CM | POA: Insufficient documentation

## 2015-04-01 DIAGNOSIS — I499 Cardiac arrhythmia, unspecified: Secondary | ICD-10-CM | POA: Diagnosis not present

## 2015-04-01 DIAGNOSIS — E11622 Type 2 diabetes mellitus with other skin ulcer: Secondary | ICD-10-CM | POA: Diagnosis not present

## 2015-04-01 DIAGNOSIS — L97811 Non-pressure chronic ulcer of other part of right lower leg limited to breakdown of skin: Secondary | ICD-10-CM | POA: Diagnosis not present

## 2015-04-01 DIAGNOSIS — G4730 Sleep apnea, unspecified: Secondary | ICD-10-CM | POA: Diagnosis not present

## 2015-04-29 ENCOUNTER — Encounter (HOSPITAL_BASED_OUTPATIENT_CLINIC_OR_DEPARTMENT_OTHER): Payer: 59 | Attending: General Surgery

## 2015-04-29 DIAGNOSIS — L97211 Non-pressure chronic ulcer of right calf limited to breakdown of skin: Secondary | ICD-10-CM | POA: Insufficient documentation

## 2015-04-29 DIAGNOSIS — I10 Essential (primary) hypertension: Secondary | ICD-10-CM | POA: Diagnosis not present

## 2015-04-29 DIAGNOSIS — I739 Peripheral vascular disease, unspecified: Secondary | ICD-10-CM | POA: Diagnosis not present

## 2015-04-29 DIAGNOSIS — E1140 Type 2 diabetes mellitus with diabetic neuropathy, unspecified: Secondary | ICD-10-CM | POA: Insufficient documentation

## 2015-04-29 DIAGNOSIS — G4730 Sleep apnea, unspecified: Secondary | ICD-10-CM | POA: Diagnosis not present

## 2015-04-29 DIAGNOSIS — M1990 Unspecified osteoarthritis, unspecified site: Secondary | ICD-10-CM | POA: Diagnosis not present

## 2015-05-06 DIAGNOSIS — I10 Essential (primary) hypertension: Secondary | ICD-10-CM | POA: Diagnosis not present

## 2015-05-06 DIAGNOSIS — E1140 Type 2 diabetes mellitus with diabetic neuropathy, unspecified: Secondary | ICD-10-CM | POA: Diagnosis not present

## 2015-05-06 DIAGNOSIS — L97211 Non-pressure chronic ulcer of right calf limited to breakdown of skin: Secondary | ICD-10-CM | POA: Diagnosis not present

## 2015-05-06 DIAGNOSIS — M1990 Unspecified osteoarthritis, unspecified site: Secondary | ICD-10-CM | POA: Diagnosis not present

## 2015-05-13 DIAGNOSIS — M1990 Unspecified osteoarthritis, unspecified site: Secondary | ICD-10-CM | POA: Diagnosis not present

## 2015-05-13 DIAGNOSIS — I10 Essential (primary) hypertension: Secondary | ICD-10-CM | POA: Diagnosis not present

## 2015-05-13 DIAGNOSIS — E1140 Type 2 diabetes mellitus with diabetic neuropathy, unspecified: Secondary | ICD-10-CM | POA: Diagnosis not present

## 2015-05-13 DIAGNOSIS — L97211 Non-pressure chronic ulcer of right calf limited to breakdown of skin: Secondary | ICD-10-CM | POA: Diagnosis not present

## 2015-05-20 ENCOUNTER — Encounter (HOSPITAL_BASED_OUTPATIENT_CLINIC_OR_DEPARTMENT_OTHER): Payer: 59 | Attending: General Surgery

## 2015-05-20 DIAGNOSIS — L97201 Non-pressure chronic ulcer of unspecified calf limited to breakdown of skin: Secondary | ICD-10-CM | POA: Diagnosis not present

## 2015-05-20 DIAGNOSIS — I739 Peripheral vascular disease, unspecified: Secondary | ICD-10-CM | POA: Diagnosis not present

## 2015-05-20 DIAGNOSIS — G4730 Sleep apnea, unspecified: Secondary | ICD-10-CM | POA: Insufficient documentation

## 2015-05-20 DIAGNOSIS — E1140 Type 2 diabetes mellitus with diabetic neuropathy, unspecified: Secondary | ICD-10-CM | POA: Insufficient documentation

## 2015-05-20 DIAGNOSIS — M1990 Unspecified osteoarthritis, unspecified site: Secondary | ICD-10-CM | POA: Insufficient documentation

## 2015-05-20 DIAGNOSIS — I10 Essential (primary) hypertension: Secondary | ICD-10-CM | POA: Diagnosis not present

## 2015-05-20 DIAGNOSIS — I482 Chronic atrial fibrillation: Secondary | ICD-10-CM | POA: Insufficient documentation

## 2015-05-20 DIAGNOSIS — Z87891 Personal history of nicotine dependence: Secondary | ICD-10-CM | POA: Diagnosis not present

## 2015-05-27 DIAGNOSIS — Z87891 Personal history of nicotine dependence: Secondary | ICD-10-CM | POA: Diagnosis not present

## 2015-05-27 DIAGNOSIS — E1140 Type 2 diabetes mellitus with diabetic neuropathy, unspecified: Secondary | ICD-10-CM | POA: Diagnosis not present

## 2015-05-27 DIAGNOSIS — L97201 Non-pressure chronic ulcer of unspecified calf limited to breakdown of skin: Secondary | ICD-10-CM | POA: Diagnosis not present

## 2015-05-27 DIAGNOSIS — I482 Chronic atrial fibrillation: Secondary | ICD-10-CM | POA: Diagnosis not present

## 2015-06-03 DIAGNOSIS — E1140 Type 2 diabetes mellitus with diabetic neuropathy, unspecified: Secondary | ICD-10-CM | POA: Diagnosis not present

## 2015-06-03 DIAGNOSIS — Z87891 Personal history of nicotine dependence: Secondary | ICD-10-CM | POA: Diagnosis not present

## 2015-06-03 DIAGNOSIS — I482 Chronic atrial fibrillation: Secondary | ICD-10-CM | POA: Diagnosis not present

## 2015-06-03 DIAGNOSIS — L97201 Non-pressure chronic ulcer of unspecified calf limited to breakdown of skin: Secondary | ICD-10-CM | POA: Diagnosis not present

## 2015-06-10 DIAGNOSIS — L97201 Non-pressure chronic ulcer of unspecified calf limited to breakdown of skin: Secondary | ICD-10-CM | POA: Diagnosis not present

## 2015-06-10 DIAGNOSIS — Z87891 Personal history of nicotine dependence: Secondary | ICD-10-CM | POA: Diagnosis not present

## 2015-06-10 DIAGNOSIS — E1140 Type 2 diabetes mellitus with diabetic neuropathy, unspecified: Secondary | ICD-10-CM | POA: Diagnosis not present

## 2015-06-10 DIAGNOSIS — I482 Chronic atrial fibrillation: Secondary | ICD-10-CM | POA: Diagnosis not present

## 2015-06-17 ENCOUNTER — Encounter (HOSPITAL_BASED_OUTPATIENT_CLINIC_OR_DEPARTMENT_OTHER): Payer: 59 | Attending: General Surgery

## 2015-06-17 DIAGNOSIS — M1990 Unspecified osteoarthritis, unspecified site: Secondary | ICD-10-CM | POA: Diagnosis not present

## 2015-06-17 DIAGNOSIS — L97812 Non-pressure chronic ulcer of other part of right lower leg with fat layer exposed: Secondary | ICD-10-CM | POA: Diagnosis not present

## 2015-06-17 DIAGNOSIS — I10 Essential (primary) hypertension: Secondary | ICD-10-CM | POA: Insufficient documentation

## 2015-06-17 DIAGNOSIS — E1140 Type 2 diabetes mellitus with diabetic neuropathy, unspecified: Secondary | ICD-10-CM | POA: Insufficient documentation

## 2015-06-17 DIAGNOSIS — E11622 Type 2 diabetes mellitus with other skin ulcer: Secondary | ICD-10-CM | POA: Diagnosis present

## 2015-06-17 DIAGNOSIS — G4730 Sleep apnea, unspecified: Secondary | ICD-10-CM | POA: Insufficient documentation

## 2015-06-17 DIAGNOSIS — E1151 Type 2 diabetes mellitus with diabetic peripheral angiopathy without gangrene: Secondary | ICD-10-CM | POA: Insufficient documentation

## 2015-06-17 DIAGNOSIS — Z86718 Personal history of other venous thrombosis and embolism: Secondary | ICD-10-CM | POA: Insufficient documentation

## 2015-06-24 DIAGNOSIS — E1140 Type 2 diabetes mellitus with diabetic neuropathy, unspecified: Secondary | ICD-10-CM | POA: Diagnosis not present

## 2015-06-24 DIAGNOSIS — E1151 Type 2 diabetes mellitus with diabetic peripheral angiopathy without gangrene: Secondary | ICD-10-CM | POA: Diagnosis not present

## 2015-06-24 DIAGNOSIS — E11622 Type 2 diabetes mellitus with other skin ulcer: Secondary | ICD-10-CM | POA: Diagnosis not present

## 2015-06-24 DIAGNOSIS — L97812 Non-pressure chronic ulcer of other part of right lower leg with fat layer exposed: Secondary | ICD-10-CM | POA: Diagnosis not present

## 2015-07-01 DIAGNOSIS — E11622 Type 2 diabetes mellitus with other skin ulcer: Secondary | ICD-10-CM | POA: Diagnosis not present

## 2015-07-01 DIAGNOSIS — E1151 Type 2 diabetes mellitus with diabetic peripheral angiopathy without gangrene: Secondary | ICD-10-CM | POA: Diagnosis not present

## 2015-07-01 DIAGNOSIS — E1140 Type 2 diabetes mellitus with diabetic neuropathy, unspecified: Secondary | ICD-10-CM | POA: Diagnosis not present

## 2015-07-01 DIAGNOSIS — L97812 Non-pressure chronic ulcer of other part of right lower leg with fat layer exposed: Secondary | ICD-10-CM | POA: Diagnosis not present

## 2015-07-08 DIAGNOSIS — E1151 Type 2 diabetes mellitus with diabetic peripheral angiopathy without gangrene: Secondary | ICD-10-CM | POA: Diagnosis not present

## 2015-07-08 DIAGNOSIS — E1140 Type 2 diabetes mellitus with diabetic neuropathy, unspecified: Secondary | ICD-10-CM | POA: Diagnosis not present

## 2015-07-08 DIAGNOSIS — L97812 Non-pressure chronic ulcer of other part of right lower leg with fat layer exposed: Secondary | ICD-10-CM | POA: Diagnosis not present

## 2015-07-08 DIAGNOSIS — E11622 Type 2 diabetes mellitus with other skin ulcer: Secondary | ICD-10-CM | POA: Diagnosis not present

## 2015-07-15 DIAGNOSIS — E11622 Type 2 diabetes mellitus with other skin ulcer: Secondary | ICD-10-CM | POA: Diagnosis not present

## 2015-07-15 DIAGNOSIS — E1140 Type 2 diabetes mellitus with diabetic neuropathy, unspecified: Secondary | ICD-10-CM | POA: Diagnosis not present

## 2015-07-15 DIAGNOSIS — L97812 Non-pressure chronic ulcer of other part of right lower leg with fat layer exposed: Secondary | ICD-10-CM | POA: Diagnosis not present

## 2015-07-15 DIAGNOSIS — E1151 Type 2 diabetes mellitus with diabetic peripheral angiopathy without gangrene: Secondary | ICD-10-CM | POA: Diagnosis not present

## 2015-07-22 ENCOUNTER — Encounter (HOSPITAL_BASED_OUTPATIENT_CLINIC_OR_DEPARTMENT_OTHER): Payer: 59 | Attending: General Surgery

## 2015-07-22 DIAGNOSIS — I82412 Acute embolism and thrombosis of left femoral vein: Secondary | ICD-10-CM | POA: Insufficient documentation

## 2015-07-22 DIAGNOSIS — E11622 Type 2 diabetes mellitus with other skin ulcer: Secondary | ICD-10-CM | POA: Diagnosis not present

## 2015-07-22 DIAGNOSIS — M1990 Unspecified osteoarthritis, unspecified site: Secondary | ICD-10-CM | POA: Insufficient documentation

## 2015-07-22 DIAGNOSIS — E1140 Type 2 diabetes mellitus with diabetic neuropathy, unspecified: Secondary | ICD-10-CM | POA: Diagnosis not present

## 2015-07-22 DIAGNOSIS — I82411 Acute embolism and thrombosis of right femoral vein: Secondary | ICD-10-CM | POA: Diagnosis not present

## 2015-07-22 DIAGNOSIS — I82431 Acute embolism and thrombosis of right popliteal vein: Secondary | ICD-10-CM | POA: Insufficient documentation

## 2015-07-22 DIAGNOSIS — L97811 Non-pressure chronic ulcer of other part of right lower leg limited to breakdown of skin: Secondary | ICD-10-CM | POA: Diagnosis not present

## 2015-07-22 DIAGNOSIS — I872 Venous insufficiency (chronic) (peripheral): Secondary | ICD-10-CM | POA: Diagnosis not present

## 2015-07-22 DIAGNOSIS — E1151 Type 2 diabetes mellitus with diabetic peripheral angiopathy without gangrene: Secondary | ICD-10-CM | POA: Diagnosis not present

## 2015-07-22 DIAGNOSIS — G4730 Sleep apnea, unspecified: Secondary | ICD-10-CM | POA: Diagnosis not present

## 2015-07-22 DIAGNOSIS — I10 Essential (primary) hypertension: Secondary | ICD-10-CM | POA: Diagnosis not present

## 2015-07-22 DIAGNOSIS — Z86718 Personal history of other venous thrombosis and embolism: Secondary | ICD-10-CM | POA: Diagnosis not present

## 2015-07-28 ENCOUNTER — Other Ambulatory Visit (HOSPITAL_BASED_OUTPATIENT_CLINIC_OR_DEPARTMENT_OTHER): Payer: Self-pay | Admitting: General Surgery

## 2015-07-28 ENCOUNTER — Ambulatory Visit (HOSPITAL_COMMUNITY)
Admission: RE | Admit: 2015-07-28 | Discharge: 2015-07-28 | Disposition: A | Discharge status: Home / Self Care | Payer: 59 | Source: Ambulatory Visit | Attending: Surgery | Admitting: Surgery

## 2015-07-28 DIAGNOSIS — L97919 Non-pressure chronic ulcer of unspecified part of right lower leg with unspecified severity: Secondary | ICD-10-CM

## 2015-07-28 DIAGNOSIS — E119 Type 2 diabetes mellitus without complications: Secondary | ICD-10-CM | POA: Insufficient documentation

## 2015-07-28 DIAGNOSIS — R609 Edema, unspecified: Secondary | ICD-10-CM

## 2015-07-28 DIAGNOSIS — I82531 Chronic embolism and thrombosis of right popliteal vein: Secondary | ICD-10-CM | POA: Insufficient documentation

## 2015-07-28 DIAGNOSIS — E785 Hyperlipidemia, unspecified: Secondary | ICD-10-CM | POA: Diagnosis not present

## 2015-07-28 DIAGNOSIS — Z86718 Personal history of other venous thrombosis and embolism: Secondary | ICD-10-CM

## 2015-07-28 DIAGNOSIS — I82813 Embolism and thrombosis of superficial veins of lower extremities, bilateral: Secondary | ICD-10-CM | POA: Diagnosis not present

## 2015-07-28 DIAGNOSIS — I82513 Chronic embolism and thrombosis of femoral vein, bilateral: Secondary | ICD-10-CM | POA: Diagnosis not present

## 2015-07-28 DIAGNOSIS — I10 Essential (primary) hypertension: Secondary | ICD-10-CM | POA: Insufficient documentation

## 2015-07-29 DIAGNOSIS — E11622 Type 2 diabetes mellitus with other skin ulcer: Secondary | ICD-10-CM | POA: Diagnosis not present

## 2015-07-29 DIAGNOSIS — G4730 Sleep apnea, unspecified: Secondary | ICD-10-CM | POA: Diagnosis not present

## 2015-07-29 DIAGNOSIS — I10 Essential (primary) hypertension: Secondary | ICD-10-CM | POA: Diagnosis not present

## 2015-07-29 DIAGNOSIS — L97811 Non-pressure chronic ulcer of other part of right lower leg limited to breakdown of skin: Secondary | ICD-10-CM | POA: Diagnosis not present

## 2015-08-05 DIAGNOSIS — E11622 Type 2 diabetes mellitus with other skin ulcer: Secondary | ICD-10-CM | POA: Diagnosis not present

## 2015-08-05 DIAGNOSIS — G4730 Sleep apnea, unspecified: Secondary | ICD-10-CM | POA: Diagnosis not present

## 2015-08-05 DIAGNOSIS — I10 Essential (primary) hypertension: Secondary | ICD-10-CM | POA: Diagnosis not present

## 2015-08-05 DIAGNOSIS — L97811 Non-pressure chronic ulcer of other part of right lower leg limited to breakdown of skin: Secondary | ICD-10-CM | POA: Diagnosis not present

## 2015-09-02 ENCOUNTER — Encounter: Payer: Self-pay | Admitting: Vascular Surgery

## 2015-09-10 ENCOUNTER — Encounter: Payer: 59 | Admitting: Vascular Surgery

## 2015-09-17 ENCOUNTER — Encounter: Payer: Self-pay | Admitting: Vascular Surgery

## 2015-09-19 ENCOUNTER — Encounter: Payer: Self-pay | Admitting: Vascular Surgery

## 2015-09-19 ENCOUNTER — Ambulatory Visit (INDEPENDENT_AMBULATORY_CARE_PROVIDER_SITE_OTHER): Payer: 59 | Admitting: Vascular Surgery

## 2015-09-19 VITALS — BP 146/95 | HR 102 | Ht 73.0 in | Wt 338.0 lb

## 2015-09-19 DIAGNOSIS — L97909 Non-pressure chronic ulcer of unspecified part of unspecified lower leg with unspecified severity: Secondary | ICD-10-CM

## 2015-09-19 DIAGNOSIS — IMO0001 Reserved for inherently not codable concepts without codable children: Secondary | ICD-10-CM

## 2015-09-19 DIAGNOSIS — I83009 Varicose veins of unspecified lower extremity with ulcer of unspecified site: Secondary | ICD-10-CM | POA: Insufficient documentation

## 2015-09-19 DIAGNOSIS — I83019 Varicose veins of right lower extremity with ulcer of unspecified site: Secondary | ICD-10-CM

## 2015-09-19 NOTE — Progress Notes (Signed)
Referred by:  Laurann Montana, MD [phone] [address] [place], [place] [address]  Reason for referral: right ankle ulcer   History of Present Illness  Victor Barnes is a 60 y.o. ([birth year]) diabetic with history of PE and DVT male who presents with chief complaint: right ankle ulcer.  Patient notes years ago developing a large abscess in R medial calf that required extensive operative debridement.  He developed a DVT and PE during that hospitalization.  Since then he has had leg swelling with one episode of skin breakdown recently.  Pt notes onset a few months ago of an ulcer in R ankle laterally that has been slow to heal in wound care clinic.  The patient has had known history of DVT, known history of varicose vein, no history of  Lymphedema and known history of skin changes in lower legs.  There is no family history of venous disorders.  The patient has used knee high compression stockings in the past.  His anatomy does not accommodate thigh high stockings easily.   The patient denies any fever or chills.  He feels his venous ulcer is healing.   Past Medical History  Diagnosis Date  . Hypertension   . Sleep apnea   . Venous stasis   . DVT (deep venous thrombosis) (HCC)     Recurrent RLE DVT, supposed to be on Coumadin but h/o non-compliance   . Cellulitis   . Atrial fibrillation or flutter     Should be on Coumadin but h/o non-compliance; started Sotalol 08/2009  . Diabetes mellitus   . Pulmonary embolism (HCC)   . Hyperlipidemia   . OSA on CPAP   . Myocardiopathy [facility])     Past Surgical History  Procedure Laterality Date  . Leg surgery      Social History   Social History  . Marital Status: Single    Spouse Name: N/A  . Number of Children: N/A  . Years of Education: N/A   Occupational History  . Not on file.   Social History Main Topics  . Smoking status: Former Smoker    Quit date: 03/23/1987  . Smokeless tobacco: Not on file  . Alcohol Use: Yes   Comment: occ  . Drug Use: No  . Sexual Activity: Not on file   Other Topics Concern  . Not on file   Social History Narrative    Family History  Problem Relation Age of Onset  . Hypertension Mother   . Diabetes Mellitus I Father   . Hypertension Paternal Grandmother   . Heart attack Paternal Grandmother     Current Outpatient Prescriptions  Medication Sig Dispense Refill  . insulin glargine (LANTUS) 100 UNIT/ML injection Inject 0.15 mLs (15 Units total) into the skin daily. (Patient taking differently: Inject 20 Units into the skin daily. ) 10 mL 1  . Insulin Lispro (HUMALOG [place]) Inject into the skin. Inject on a sliding scale    . lisinopril (PRINIVIL,ZESTRIL) 5 MG tablet Take 5 mg by mouth daily.  1  . metoprolol succinate (TOPROL-XL) 50 MG 24 hr tablet Take 50 mg by mouth daily.  1  . XARELTO 20 MG TABS tablet Take 20 mg by mouth daily.  0  . BESIVANCE 0.6 % SUSP     . DUREZOL 0.05 % EMUL     . ILEVRO 0.3 % ophthalmic suspension     . VIAGRA 100 MG tablet      No current facility-administered medications for  this visit.    Allergies  Allergen Reactions  . Crestor [Rosuvastatin] Other (See Comments)    myalgias  . Lipitor [Atorvastatin] Other (See Comments)    myalgias  . Pravastatin Other (See Comments)    myalgias     REVIEW OF SYSTEMS:  (Positives checked otherwise negative)  CARDIOVASCULAR:    chest pain,   chest pressure,   palpitations,   shortness of breath when laying flat,   shortness of breath with exertion,    pain in feet when walking,   pain in feet when laying flat,  history of blood clot in veins (DVT),   history of phlebitis,   swelling in legs,   varicose veins  PULMONARY:    productive cough,   asthma,   wheezing  NEUROLOGIC:    weakness in arms or legs,   numbness in arms or legs,   difficulty speaking or slurred speech,   temporary loss of vision in one eye,    dizziness  HEMATOLOGIC:    bleeding problems,   problems with blood clotting too easily  MUSCULOSKEL:    joint pain,  joint swelling  GASTROINTEST:    vomiting blood,   blood in stool     GENITOURINARY:    burning with urination,   blood in urine  PSYCHIATRIC:    history of major depression  INTEGUMENTARY:    rashes,   ulcers  CONSTITUTIONAL:    fever,   chills   Physical Examination  Filed Vitals:   09/19/15 0903 09/19/15 0904  BP: 152/88 146/95  Pulse: 102   Height:  (1.854 m)   Weight: 338 lb (153.316 kg)   SpO2: 99%    Body mass index is 44.6 kg/(m^2).  General: A&O x 3, WD, morbidly obese  Head: [place]/AT  Ear/Nose/Throat: Hearing grossly intact, nares without erythema or drainage, oropharynx without Erythema/Exudate, Mallampati score: 3  Eyes: PERRLA, EOMI  Neck: Supple, no nuchal rigidity, no palpable LAD  Pulmonary: Sym exp, good air movt, CTAB, no rales, rhonchi, & wheezing  Cardiac: RRR, Nl S1, S2, no Murmurs, rubs or gallops  Vascular: Vessel Right Left  Radial Palpable Palpable  Brachial Palpable Palpable  Carotid Palpable, without bruit Palpable, without bruit  Aorta Not palpable N/A  Femoral Palpable Palpable  Popliteal Not palpable Not palpable  PT Palpable Palpable  DP Palpable Palpable   Gastrointestinal: soft, NTND, no G/R, no HSM, no masses, no CVAT B  Musculoskeletal: M/S 5/5 throughout , Extremities without ischemic changes, +LDS (R>L), + B edema (R>L), venous ulcer on lateral ankle (~3 cm in diameter, silvadene on wound), no ascending erythema or TTP to light touch, healed scar in medial calf  Neurologic: CN 2-12 intact , Pain and light touch intact in extremities , Motor exam as listed above  Psychiatric: Judgment intact, Mood & affect appropriate for pt's clinical situation  Dermatologic: See M/S exam for extremity exam, no rashes otherwise noted  Lymph : No Cervical, Axillary,  or Inguinal lymphadenopathy    Non-Invasive Vascular Imaging  BLE Venous Insufficiency Duplex (Date: 07/27/16):   RLE:   + chronic DVT   + Chronic SVT,   + GSV reflux: 4.8-7.2 mm  no SSV reflux,  + deep venous reflux: CFV, PV  LLE:  +chronic DVT   + chronic SVT,   + GSV reflux: 2.9-7.4 mm  no SSV reflux,  + deep venous reflux: CFV, FV, PV   Outside Studies/Documentation 10 pages of outside documents were reviewed including: outpatient wound clinic chart.   Medical Decision Making  MERVYN PFLAUM is a 60 y.o. male who presents with: BLE chronic venous insufficiency (C6), varicose veins with ulcers, VSU   Patient has chronic thrombus in both superficial and deep systems.    It's not clear to me the risk-benefit analysis in this patient given he is reportedly is healing with conservative measures anyway.  We discussed continuing with wound care.  If he fails to heal within 3 months, he will follow up with my partners to consider stripping the R GSV vs EVLA.  Meanwhile, he will also continue with compressive therapy at 20-30 mm Hg.  Thank you for allowing [place] to participate in this patient's care.   Leonides Sake, MD [facility] Office: [phone] Pager: [phone]  09/19/2015, 9:24 AM

## 2017-05-12 ENCOUNTER — Ambulatory Visit
Admission: RE | Admit: 2017-05-12 | Discharge: 2017-05-12 | Disposition: A | Discharge status: Home / Self Care | Payer: 59 | Source: Ambulatory Visit | Attending: Family Medicine | Admitting: Family Medicine

## 2017-05-12 ENCOUNTER — Other Ambulatory Visit: Payer: Self-pay | Admitting: Family Medicine

## 2017-05-12 DIAGNOSIS — R05 Cough: Secondary | ICD-10-CM

## 2017-05-12 DIAGNOSIS — R059 Cough, unspecified: Secondary | ICD-10-CM

## 2017-10-10 ENCOUNTER — Other Ambulatory Visit: Payer: Self-pay | Admitting: Family Medicine

## 2017-10-10 DIAGNOSIS — L97919 Non-pressure chronic ulcer of unspecified part of right lower leg with unspecified severity: Secondary | ICD-10-CM

## 2017-10-18 ENCOUNTER — Ambulatory Visit
Admission: RE | Admit: 2017-10-18 | Discharge: 2017-10-18 | Disposition: A | Discharge status: Home / Self Care | Payer: 59 | Source: Ambulatory Visit | Attending: Family Medicine | Admitting: Family Medicine

## 2017-10-18 DIAGNOSIS — L97919 Non-pressure chronic ulcer of unspecified part of right lower leg with unspecified severity: Secondary | ICD-10-CM

## 2017-10-20 ENCOUNTER — Encounter (HOSPITAL_BASED_OUTPATIENT_CLINIC_OR_DEPARTMENT_OTHER): Payer: 59 | Attending: Internal Medicine

## 2017-10-20 DIAGNOSIS — Z86718 Personal history of other venous thrombosis and embolism: Secondary | ICD-10-CM | POA: Diagnosis not present

## 2017-10-20 DIAGNOSIS — E1140 Type 2 diabetes mellitus with diabetic neuropathy, unspecified: Secondary | ICD-10-CM | POA: Diagnosis not present

## 2017-10-20 DIAGNOSIS — G4730 Sleep apnea, unspecified: Secondary | ICD-10-CM | POA: Diagnosis not present

## 2017-10-20 DIAGNOSIS — Z87891 Personal history of nicotine dependence: Secondary | ICD-10-CM | POA: Insufficient documentation

## 2017-10-20 DIAGNOSIS — I10 Essential (primary) hypertension: Secondary | ICD-10-CM | POA: Diagnosis not present

## 2017-10-20 DIAGNOSIS — Z794 Long term (current) use of insulin: Secondary | ICD-10-CM | POA: Insufficient documentation

## 2017-10-20 DIAGNOSIS — I87331 Chronic venous hypertension (idiopathic) with ulcer and inflammation of right lower extremity: Secondary | ICD-10-CM | POA: Diagnosis present

## 2017-10-20 DIAGNOSIS — L97812 Non-pressure chronic ulcer of other part of right lower leg with fat layer exposed: Secondary | ICD-10-CM | POA: Insufficient documentation

## 2017-10-27 DIAGNOSIS — I87331 Chronic venous hypertension (idiopathic) with ulcer and inflammation of right lower extremity: Secondary | ICD-10-CM | POA: Diagnosis not present

## 2017-11-03 DIAGNOSIS — I87331 Chronic venous hypertension (idiopathic) with ulcer and inflammation of right lower extremity: Secondary | ICD-10-CM | POA: Diagnosis not present

## 2017-11-10 DIAGNOSIS — I87331 Chronic venous hypertension (idiopathic) with ulcer and inflammation of right lower extremity: Secondary | ICD-10-CM | POA: Diagnosis not present

## 2017-11-17 ENCOUNTER — Encounter (HOSPITAL_BASED_OUTPATIENT_CLINIC_OR_DEPARTMENT_OTHER): Payer: 59 | Attending: Internal Medicine

## 2017-11-17 DIAGNOSIS — E1140 Type 2 diabetes mellitus with diabetic neuropathy, unspecified: Secondary | ICD-10-CM | POA: Insufficient documentation

## 2017-11-17 DIAGNOSIS — Z86718 Personal history of other venous thrombosis and embolism: Secondary | ICD-10-CM | POA: Insufficient documentation

## 2017-11-17 DIAGNOSIS — G4730 Sleep apnea, unspecified: Secondary | ICD-10-CM | POA: Insufficient documentation

## 2017-11-17 DIAGNOSIS — L97812 Non-pressure chronic ulcer of other part of right lower leg with fat layer exposed: Secondary | ICD-10-CM | POA: Insufficient documentation

## 2017-11-17 DIAGNOSIS — I87331 Chronic venous hypertension (idiopathic) with ulcer and inflammation of right lower extremity: Secondary | ICD-10-CM | POA: Insufficient documentation

## 2017-11-17 DIAGNOSIS — I10 Essential (primary) hypertension: Secondary | ICD-10-CM | POA: Diagnosis not present

## 2017-11-17 DIAGNOSIS — I890 Lymphedema, not elsewhere classified: Secondary | ICD-10-CM | POA: Diagnosis not present

## 2017-11-17 DIAGNOSIS — E1151 Type 2 diabetes mellitus with diabetic peripheral angiopathy without gangrene: Secondary | ICD-10-CM | POA: Diagnosis not present

## 2017-11-17 DIAGNOSIS — E11622 Type 2 diabetes mellitus with other skin ulcer: Secondary | ICD-10-CM | POA: Insufficient documentation

## 2017-11-24 DIAGNOSIS — I87331 Chronic venous hypertension (idiopathic) with ulcer and inflammation of right lower extremity: Secondary | ICD-10-CM | POA: Diagnosis not present

## 2017-12-02 DIAGNOSIS — I87331 Chronic venous hypertension (idiopathic) with ulcer and inflammation of right lower extremity: Secondary | ICD-10-CM | POA: Diagnosis not present

## 2017-12-09 DIAGNOSIS — I87331 Chronic venous hypertension (idiopathic) with ulcer and inflammation of right lower extremity: Secondary | ICD-10-CM | POA: Diagnosis not present

## 2017-12-15 ENCOUNTER — Encounter (HOSPITAL_BASED_OUTPATIENT_CLINIC_OR_DEPARTMENT_OTHER): Payer: 59 | Attending: Internal Medicine

## 2017-12-15 DIAGNOSIS — Z86718 Personal history of other venous thrombosis and embolism: Secondary | ICD-10-CM | POA: Insufficient documentation

## 2017-12-15 DIAGNOSIS — E1140 Type 2 diabetes mellitus with diabetic neuropathy, unspecified: Secondary | ICD-10-CM | POA: Diagnosis not present

## 2017-12-15 DIAGNOSIS — I890 Lymphedema, not elsewhere classified: Secondary | ICD-10-CM | POA: Insufficient documentation

## 2017-12-15 DIAGNOSIS — I87331 Chronic venous hypertension (idiopathic) with ulcer and inflammation of right lower extremity: Secondary | ICD-10-CM | POA: Insufficient documentation

## 2017-12-15 DIAGNOSIS — L97912 Non-pressure chronic ulcer of unspecified part of right lower leg with fat layer exposed: Secondary | ICD-10-CM | POA: Insufficient documentation

## 2017-12-15 DIAGNOSIS — I10 Essential (primary) hypertension: Secondary | ICD-10-CM | POA: Insufficient documentation

## 2017-12-15 DIAGNOSIS — G4730 Sleep apnea, unspecified: Secondary | ICD-10-CM | POA: Insufficient documentation

## 2017-12-15 DIAGNOSIS — L97812 Non-pressure chronic ulcer of other part of right lower leg with fat layer exposed: Secondary | ICD-10-CM | POA: Insufficient documentation

## 2017-12-15 DIAGNOSIS — E11622 Type 2 diabetes mellitus with other skin ulcer: Secondary | ICD-10-CM | POA: Insufficient documentation

## 2017-12-15 DIAGNOSIS — E1151 Type 2 diabetes mellitus with diabetic peripheral angiopathy without gangrene: Secondary | ICD-10-CM | POA: Insufficient documentation

## 2017-12-16 ENCOUNTER — Encounter (HOSPITAL_BASED_OUTPATIENT_CLINIC_OR_DEPARTMENT_OTHER): Payer: 59

## 2017-12-22 DIAGNOSIS — I87331 Chronic venous hypertension (idiopathic) with ulcer and inflammation of right lower extremity: Secondary | ICD-10-CM | POA: Diagnosis not present

## 2017-12-29 DIAGNOSIS — I87331 Chronic venous hypertension (idiopathic) with ulcer and inflammation of right lower extremity: Secondary | ICD-10-CM | POA: Diagnosis not present

## 2018-01-23 ENCOUNTER — Encounter (HOSPITAL_BASED_OUTPATIENT_CLINIC_OR_DEPARTMENT_OTHER): Payer: 59 | Attending: Internal Medicine

## 2018-01-23 DIAGNOSIS — L97812 Non-pressure chronic ulcer of other part of right lower leg with fat layer exposed: Secondary | ICD-10-CM | POA: Diagnosis not present

## 2018-01-23 DIAGNOSIS — E11622 Type 2 diabetes mellitus with other skin ulcer: Secondary | ICD-10-CM | POA: Diagnosis not present

## 2018-01-23 DIAGNOSIS — E1151 Type 2 diabetes mellitus with diabetic peripheral angiopathy without gangrene: Secondary | ICD-10-CM | POA: Diagnosis not present

## 2018-01-23 DIAGNOSIS — G4730 Sleep apnea, unspecified: Secondary | ICD-10-CM | POA: Insufficient documentation

## 2018-01-23 DIAGNOSIS — E1140 Type 2 diabetes mellitus with diabetic neuropathy, unspecified: Secondary | ICD-10-CM | POA: Diagnosis not present

## 2018-01-23 DIAGNOSIS — I87331 Chronic venous hypertension (idiopathic) with ulcer and inflammation of right lower extremity: Secondary | ICD-10-CM | POA: Insufficient documentation

## 2018-01-23 DIAGNOSIS — I890 Lymphedema, not elsewhere classified: Secondary | ICD-10-CM | POA: Insufficient documentation

## 2018-01-23 DIAGNOSIS — I10 Essential (primary) hypertension: Secondary | ICD-10-CM | POA: Diagnosis not present

## 2018-01-23 DIAGNOSIS — Z86718 Personal history of other venous thrombosis and embolism: Secondary | ICD-10-CM | POA: Diagnosis not present

## 2018-01-30 DIAGNOSIS — I87331 Chronic venous hypertension (idiopathic) with ulcer and inflammation of right lower extremity: Secondary | ICD-10-CM | POA: Diagnosis not present

## 2018-02-06 DIAGNOSIS — I87331 Chronic venous hypertension (idiopathic) with ulcer and inflammation of right lower extremity: Secondary | ICD-10-CM | POA: Diagnosis not present

## 2018-02-13 ENCOUNTER — Encounter (HOSPITAL_BASED_OUTPATIENT_CLINIC_OR_DEPARTMENT_OTHER): Payer: 59 | Attending: Internal Medicine

## 2018-02-13 DIAGNOSIS — L97812 Non-pressure chronic ulcer of other part of right lower leg with fat layer exposed: Secondary | ICD-10-CM | POA: Insufficient documentation

## 2018-02-13 DIAGNOSIS — Z86718 Personal history of other venous thrombosis and embolism: Secondary | ICD-10-CM | POA: Diagnosis not present

## 2018-02-13 DIAGNOSIS — I890 Lymphedema, not elsewhere classified: Secondary | ICD-10-CM | POA: Insufficient documentation

## 2018-02-13 DIAGNOSIS — I10 Essential (primary) hypertension: Secondary | ICD-10-CM | POA: Insufficient documentation

## 2018-02-13 DIAGNOSIS — E1140 Type 2 diabetes mellitus with diabetic neuropathy, unspecified: Secondary | ICD-10-CM | POA: Diagnosis not present

## 2018-02-13 DIAGNOSIS — I87331 Chronic venous hypertension (idiopathic) with ulcer and inflammation of right lower extremity: Secondary | ICD-10-CM | POA: Insufficient documentation

## 2018-02-13 DIAGNOSIS — G4730 Sleep apnea, unspecified: Secondary | ICD-10-CM | POA: Insufficient documentation

## 2018-02-20 DIAGNOSIS — I87331 Chronic venous hypertension (idiopathic) with ulcer and inflammation of right lower extremity: Secondary | ICD-10-CM | POA: Diagnosis not present

## 2018-02-21 DIAGNOSIS — I87331 Chronic venous hypertension (idiopathic) with ulcer and inflammation of right lower extremity: Secondary | ICD-10-CM | POA: Diagnosis not present

## 2018-02-27 DIAGNOSIS — I87331 Chronic venous hypertension (idiopathic) with ulcer and inflammation of right lower extremity: Secondary | ICD-10-CM | POA: Diagnosis not present

## 2018-03-06 DIAGNOSIS — I87331 Chronic venous hypertension (idiopathic) with ulcer and inflammation of right lower extremity: Secondary | ICD-10-CM | POA: Diagnosis not present

## 2018-04-18 ENCOUNTER — Telehealth: Payer: Self-pay | Admitting: Physician Assistant

## 2018-04-18 ENCOUNTER — Telehealth: Payer: Self-pay

## 2018-04-18 NOTE — Telephone Encounter (Signed)
Referral notes sent from [facility] at [facility], Dr. Laurann Montana [phone] Notes sent to scheduling.

## 2018-04-18 NOTE — Telephone Encounter (Signed)
New Message         *STAT* If patient is at the pharmacy, call can be transferred to refill team.   1. Which medications need to be refilled? (please list name of each medication and dose if known) bp meds/heart meds/  2. Which pharmacy/location (including street and city if local pharmacy) is medication to be sent to? [facility] [address]  3. Do they need a 30 day or 90 day supply? 30   Patient is unable to make her appt. On tomorrow, and she is currently out of all of her meds (per patient)the next available appt was 09/17, patient did not take that one because she said she needed something sooner. Pls fill and advise.

## 2018-04-28 ENCOUNTER — Encounter: Payer: Self-pay | Admitting: *Deleted

## 2018-06-20 ENCOUNTER — Encounter: Payer: Self-pay | Admitting: Cardiology

## 2018-06-20 ENCOUNTER — Ambulatory Visit (INDEPENDENT_AMBULATORY_CARE_PROVIDER_SITE_OTHER): Payer: 59 | Admitting: Cardiology

## 2018-06-20 ENCOUNTER — Encounter (INDEPENDENT_AMBULATORY_CARE_PROVIDER_SITE_OTHER): Payer: Self-pay

## 2018-06-20 VITALS — BP 120/66 | HR 94 | Ht 73.0 in | Wt 352.6 lb

## 2018-06-20 DIAGNOSIS — I480 Paroxysmal atrial fibrillation: Secondary | ICD-10-CM | POA: Diagnosis not present

## 2018-06-20 DIAGNOSIS — G4733 Obstructive sleep apnea (adult) (pediatric): Secondary | ICD-10-CM

## 2018-06-20 DIAGNOSIS — E119 Type 2 diabetes mellitus without complications: Secondary | ICD-10-CM

## 2018-06-20 DIAGNOSIS — Z7901 Long term (current) use of anticoagulants: Secondary | ICD-10-CM

## 2018-06-20 DIAGNOSIS — I10 Essential (primary) hypertension: Secondary | ICD-10-CM

## 2018-06-20 NOTE — Progress Notes (Signed)
Cardiology Office Note:    Date:  06/20/2018   ID:  Buffalo Cellar, DOB 07/01/1956, MRN [id number]  PCP:  Laurann Montana, MD  Cardiologist:  No primary care provider on file.  Electrophysiologist:  None   Referring MD: Laurann Montana, MD     History of Present Illness:    Victor Barnes is a 61 y.o. male here for the evaluation of atrial fibrillation/flutter with prior use of sotalol started in January 2011 with noted noncompliance with Coumadin currently listed with Xarelto, recurrent right lower extremity DVT, hypertension hyperlipidemia obstructive sleep apnea, PE.  Also has diabetes.  Reviewed Dr. Lucilla Lame note.  It has been over 3 years since his last visit.  He had a prior history of atrial flutter that was rate controlled, asymptomatic.  Prior large abscess right medial calf that required extensive operative debridement.  He has not followed up in the past, nor with Dr. Sharl Ma.  Previously stopped Jardiance because of yeast infection.  LDL 122 triglycerides 146 HDL 29 hemoglobin 14.7.  Previously severe obstructive sleep apnea.  Overall he is doing quite well.  No issues with regards to his atrial fibrillation/heart.  He has developing some symptoms of peripheral neuropathy likely from diabetes.  Arterial ABIs were normal previously.  We discussed this.  Gypsy Balsam, NP is his stepdaughter.  Past Medical History:  Diagnosis Date  . Atrial fibrillation or flutter    Should be on Coumadin but h/o non-compliance; started Sotalol 08/2009  . Cellulitis   . Diabetes mellitus   . DVT (deep venous thrombosis) (HCC)    Recurrent RLE DVT, supposed to be on Coumadin but h/o non-compliance   . Hyperlipidemia   . Hypertension   . Myocardiopathy (HCC)   . OSA on CPAP   . Pulmonary embolism (HCC)   . Sleep apnea   . Venous stasis     Past Surgical History:  Procedure Laterality Date  . LEG SURGERY      Current Medications: Current Meds  Medication Sig  . Insulin  Lispro (HUMALOG [facility]) Inject into the skin. Inject on a sliding scale  . insulin regular (NOVOLIN R,HUMULIN R) 100 units/mL injection Inject into the skin daily. Patient reports injecting 80 units in the morning, 40 units at lunch time and 40 units at night time.  Marland Kitchen lisinopril (PRINIVIL,ZESTRIL) 5 MG tablet Take 5 mg by mouth daily.  . metoprolol succinate (TOPROL-XL) 50 MG 24 hr tablet Take 50 mg by mouth daily.  Carlena Hurl 20 MG TABS tablet Take 20 mg by mouth daily.     Allergies:   Crestor [rosuvastatin]; Lipitor [atorvastatin]; and Pravastatin   Social History   Socioeconomic History  . Marital status: Married    Spouse name: Not on file  . Number of children: Not on file  . Years of education: Not on file  . Highest education level: Not on file  Occupational History  . Not on file  Social Needs  . Financial resource strain: Not on file  . Food insecurity:    Worry: Not on file    Inability: Not on file  . Transportation needs:    Medical: Not on file    Non-medical: Not on file  Tobacco Use  . Smoking status: Former Smoker    Last attempt to quit: 03/23/1987    Years since quitting: 31.2  Substance and Sexual Activity  . Alcohol use: Yes    Comment: occ  . Drug use: No  . Sexual activity: Not  on file  Lifestyle  . Physical activity:    Days per week: Not on file    Minutes per session: Not on file  . Stress: Not on file  Relationships  . Social connections:    Talks on phone: Not on file    Gets together: Not on file    Attends religious service: Not on file    Active member of club or organization: Not on file    Attends meetings of clubs or organizations: Not on file    Relationship status: Not on file  Other Topics Concern  . Not on file  Social History Narrative  . Not on file     Family History: The patient's family history includes Diabetes Mellitus I in his father; Heart attack in his paternal grandmother; Hypertension in his mother and paternal  grandmother.  ROS:   Please see the history of present illness.    Denies any bleeding fevers chills nausea vomiting syncope all other systems reviewed and are negative.  EKGs/Labs/Other Studies Reviewed:    The following studies were reviewed today: Prior office notes lab work EKG  Echocardiogram 12/12/2012 -EF 40 to 45%, challenging images, mild LVH  Lower extremity arterial Dopplers 10/18/2017: Resting ABI the bilateral lower extremities within normal limits, with the segmental exam demonstrating no evidence of significant arterial occlusive disease.   EKG:  EKG is  ordered today.  The ekg ordered today demonstrates 06/20/2018-atrial fibrillation right bundle branch block heart rate 94 bpm QRS duration 150 ms.  Prior EKG 12/21/12 shows right bundle branch block with heart rate of 146, likely atrial flutter underlying  Recent Labs: No results found for requested labs within last 8760 hours.  Recent Lipid Panel No results found for: CHOL, TRIG, HDL, CHOLHDL, VLDL, LDLCALC, LDLDIRECT  Physical Exam:    VS:  BP 120/66   Pulse 94   Ht 6\' 1"  (1.854 m)   Wt (!) 352 lb 9.6 oz (159.9 kg)   BMI 46.52 kg/m     Wt Readings from Last 3 Encounters:  06/20/18 (!) 352 lb 9.6 oz (159.9 kg)  09/19/15 (!) 338 lb (153.3 kg)  11/22/14 (!) 308 lb (139.7 kg)     GEN: Morbidly obese well nourished, well developed in no acute distress HEENT: Normal NECK: No JVD; No carotid bruits LYMPHATICS: No lymphadenopathy CARDIAC: Irreg irreg normal rate, no murmurs, rubs, gallops RESPIRATORY:  Clear to auscultation without rales, wheezing or rhonchi  ABDOMEN: Soft, non-tender, non-distended MUSCULOSKELETAL:  No edema; No deformity  SKIN: Warm and dry, wearing compression hose NEUROLOGIC:  Alert and oriented x 3 PSYCHIATRIC:  Normal affect   ASSESSMENT:    1. Paroxysmal atrial fibrillation (HCC)   2. Diabetes mellitus with coincident hypertension (HCC)   3. Morbid obesity (HCC)   4. Obstructive  sleep apnea   5. Chronic anticoagulation    PLAN:    In order of problems listed above:  Atrial flutter/atrial fibrillation - Rate controlled.  We would have trouble maintaining sinus rhythm given his risk factors.  Right bundle branch block noted.  Prior EF mildly low likely partly resultant from tachycardia.  Chronic anticoagulation - Xarelto.  Check lab every 6 months.  Currently stable.  Right bundle branch block - No syncope, no other high risk symptoms.  Doing well.  Tachycardia induced cardiomyopathy -Prior EF 40 to 45%, asymptomatic.  Continue with adequate rate control.  Morbid obesity - Continue to encourage weight loss, decrease carbohydrates.  Discussed this at length.  Of  utmost importance.  He has gained over 40 pounds in the last 3 years.  Diabetes with atrial fibrillation - Weight loss is of utmost important.  Continue to encourage compliance.  1 year follow-up  Medication Adjustments/Labs and Tests Ordered: Current medicines are reviewed at length with the patient today.  Concerns regarding medicines are outlined above.  Orders Placed This Encounter  Procedures  . EKG 12-Lead   No orders of the defined types were placed in this encounter.   Patient Instructions  Medication Instructions:  The current medical regimen is effective;  continue present plan and medications.  If you need a refill on your cardiac medications before your next appointment, please call your pharmacy.   Follow-Up: At [facility], you and your health needs are our priority.  As part of our continuing mission to provide you with exceptional heart care, we have created designated Provider Care Teams.  These Care Teams include your primary Cardiologist (physician) and Advanced Practice Providers (APPs -  Physician Assistants and Nurse Practitioners) who all work together to provide you with the care you need, when you need it. You will need a follow up appointment in 12 months.  Please  call our office 2 months in advance to schedule this appointment.  You may see Dr Donato Schultz or one of the following Advanced Practice Providers on your designated Care Team:   Norma Fredrickson, NP Nada Boozer, NP . Georgie Chard, NP  Thank you for choosing [facility]!!         Signed, Donato Schultz, MD  06/20/2018 9:22 AM    [facility]

## 2018-06-20 NOTE — Patient Instructions (Signed)
Medication Instructions:  The current medical regimen is effective;  continue present plan and medications.  If you need a refill on your cardiac medications before your next appointment, please call your pharmacy.   Follow-Up: At [facility], you and your health needs are our priority.  As part of our continuing mission to provide you with exceptional heart care, we have created designated Provider Care Teams.  These Care Teams include your primary Cardiologist (physician) and Advanced Practice Providers (APPs -  Physician Assistants and Nurse Practitioners) who all work together to provide you with the care you need, when you need it. You will need a follow up appointment in 12 months.  Please call our office 2 months in advance to schedule this appointment.  You may see Dr Mark Skains. or one of the following Advanced Practice Providers on your designated Care Team:   Lori Gerhardt, NP Laura Ingold, NP . Jill McDaniel, NP  Thank you for choosing [facility]!!      

## 2019-06-04 ENCOUNTER — Encounter (HOSPITAL_BASED_OUTPATIENT_CLINIC_OR_DEPARTMENT_OTHER): Payer: Self-pay | Admitting: *Deleted

## 2019-06-04 ENCOUNTER — Other Ambulatory Visit: Payer: Self-pay

## 2019-06-04 ENCOUNTER — Emergency Department (HOSPITAL_BASED_OUTPATIENT_CLINIC_OR_DEPARTMENT_OTHER)
Admission: EM | Admit: 2019-06-04 | Discharge: 2019-06-04 | Disposition: A | Discharge status: Home / Self Care | Payer: 59 | Attending: Emergency Medicine | Admitting: Emergency Medicine

## 2019-06-04 ENCOUNTER — Emergency Department (HOSPITAL_BASED_OUTPATIENT_CLINIC_OR_DEPARTMENT_OTHER): Payer: 59

## 2019-06-04 DIAGNOSIS — I10 Essential (primary) hypertension: Secondary | ICD-10-CM | POA: Diagnosis not present

## 2019-06-04 DIAGNOSIS — S39012A Strain of muscle, fascia and tendon of lower back, initial encounter: Secondary | ICD-10-CM | POA: Diagnosis not present

## 2019-06-04 DIAGNOSIS — Y939 Activity, unspecified: Secondary | ICD-10-CM | POA: Insufficient documentation

## 2019-06-04 DIAGNOSIS — E119 Type 2 diabetes mellitus without complications: Secondary | ICD-10-CM | POA: Diagnosis not present

## 2019-06-04 DIAGNOSIS — Y33XXXA Other specified events, undetermined intent, initial encounter: Secondary | ICD-10-CM | POA: Diagnosis not present

## 2019-06-04 DIAGNOSIS — N200 Calculus of kidney: Secondary | ICD-10-CM | POA: Diagnosis not present

## 2019-06-04 DIAGNOSIS — Z86718 Personal history of other venous thrombosis and embolism: Secondary | ICD-10-CM | POA: Insufficient documentation

## 2019-06-04 DIAGNOSIS — Z79899 Other long term (current) drug therapy: Secondary | ICD-10-CM | POA: Insufficient documentation

## 2019-06-04 DIAGNOSIS — I4891 Unspecified atrial fibrillation: Secondary | ICD-10-CM | POA: Insufficient documentation

## 2019-06-04 DIAGNOSIS — Z86711 Personal history of pulmonary embolism: Secondary | ICD-10-CM | POA: Insufficient documentation

## 2019-06-04 DIAGNOSIS — Y999 Unspecified external cause status: Secondary | ICD-10-CM | POA: Insufficient documentation

## 2019-06-04 DIAGNOSIS — Z794 Long term (current) use of insulin: Secondary | ICD-10-CM | POA: Diagnosis not present

## 2019-06-04 DIAGNOSIS — Z7901 Long term (current) use of anticoagulants: Secondary | ICD-10-CM | POA: Diagnosis not present

## 2019-06-04 DIAGNOSIS — Y929 Unspecified place or not applicable: Secondary | ICD-10-CM | POA: Diagnosis not present

## 2019-06-04 DIAGNOSIS — S3992XA Unspecified injury of lower back, initial encounter: Secondary | ICD-10-CM | POA: Diagnosis present

## 2019-06-04 LAB — BASIC METABOLIC PANEL
Anion gap: 13 (ref 5–15)
BUN: 13 mg/dL (ref 8–23)
CO2: 25 mmol/L (ref 22–32)
Calcium: 8.6 mg/dL — ABNORMAL LOW (ref 8.9–10.3)
Chloride: 100 mmol/L (ref 98–111)
Creatinine, Ser: 0.79 mg/dL (ref 0.61–1.24)
GFR calc Af Amer: >60 mL/min (ref >60)
GFR calc non Af Amer: >60 mL/min (ref >60)
Glucose, Bld: 147 mg/dL — ABNORMAL HIGH (ref 70–99)
Potassium: 4 mmol/L (ref 3.5–5.1)
Sodium: 138 mmol/L (ref 135–145)

## 2019-06-04 LAB — CBC WITH DIFFERENTIAL/PLATELET
Abs Immature Granulocytes: 0.02 10*3/uL (ref 0.00–0.07)
Basophils Absolute: 0.1 10*3/uL (ref 0.0–0.1)
Basophils Relative: 1 %
Eosinophils Absolute: 0.3 10*3/uL (ref 0.0–0.5)
Eosinophils Relative: 3 %
HCT: 43.8 % (ref 39.0–52.0)
Hemoglobin: 14.3 g/dL (ref 13.0–17.0)
Immature Granulocytes: 0 %
Lymphocytes Relative: 20 %
Lymphs Abs: 1.6 10*3/uL (ref 0.7–4.0)
MCH: 28.3 pg (ref 26.0–34.0)
MCHC: 32.6 g/dL (ref 30.0–36.0)
MCV: 86.7 fL (ref 80.0–100.0)
Monocytes Absolute: 0.5 10*3/uL (ref 0.1–1.0)
Monocytes Relative: 7 %
Neutro Abs: 5.5 10*3/uL (ref 1.7–7.7)
Neutrophils Relative %: 69 %
Platelets: 244 10*3/uL (ref 150–400)
RBC: 5.05 MIL/uL (ref 4.22–5.81)
RDW: 13.4 % (ref 11.5–15.5)
WBC: 8 10*3/uL (ref 4.0–10.5)
nRBC: 0 % (ref 0.0–0.2)

## 2019-06-04 LAB — URINALYSIS, ROUTINE W REFLEX MICROSCOPIC
Bilirubin Urine: NEGATIVE
Glucose, UA: NEGATIVE mg/dL
Hgb urine dipstick: NEGATIVE
Ketones, ur: NEGATIVE mg/dL
Leukocytes,Ua: NEGATIVE
Nitrite: NEGATIVE
Protein, ur: NEGATIVE mg/dL
Specific Gravity, Urine: 1.025 (ref 1.005–1.030)
pH: 5.5 (ref 5.0–8.0)

## 2019-06-04 MED ORDER — METHOCARBAMOL 500 MG PO TABS
500.0000 mg | ORAL_TABLET | Freq: Once | ORAL | Status: AC
  Administered 2019-06-04: 500 mg via ORAL
  Filled 2019-06-04: qty 1

## 2019-06-04 MED ORDER — HYDROCODONE-ACETAMINOPHEN 5-325 MG PO TABS: 1.0000 | ORAL_TABLET | Freq: Four times a day (QID) | ORAL | 0 refills | Status: DC | PRN

## 2019-06-04 MED ORDER — MORPHINE SULFATE (PF) 4 MG/ML IV SOLN
4.0000 mg | Freq: Once | INTRAVENOUS | Status: AC
  Administered 2019-06-04: 4 mg via INTRAVENOUS
  Filled 2019-06-04: qty 1

## 2019-06-04 MED ORDER — METHOCARBAMOL 500 MG PO TABS: 500.0000 mg | ORAL_TABLET | Freq: Two times a day (BID) | ORAL | 0 refills | Status: DC

## 2019-06-04 MED ORDER — FENTANYL CITRATE (PF) 100 MCG/2ML IJ SOLN
50.0000 ug | Freq: Once | INTRAMUSCULAR | Status: AC
  Administered 2019-06-04: 15:00:00 50 ug via INTRAVENOUS
  Filled 2019-06-04: qty 2

## 2019-06-04 MED FILL — METHOCARBAMOL 500 MG TABLET: 500 | 10 days supply | Qty: 20 | Fill #0

## 2019-06-04 MED FILL — HYDROCODON-APAP 5-325: 5-325 | 2 days supply | Qty: 6 | Fill #0

## 2019-06-04 NOTE — ED Notes (Signed)
Pt teaching provided on medications that may cause drowsiness. Pt instructed not to drive or operate heavy machinery while taking the prescribed medication. Pt verbalized understanding.   

## 2019-06-04 NOTE — ED Triage Notes (Signed)
Right flank pain x 2 weeks. No hx of kidney stones.

## 2019-06-04 NOTE — ED Provider Notes (Signed)
[facility] EMERGENCY DEPARTMENT Provider Note   CSN: [id number] Arrival date & time: 06/04/19  1142     History   Chief Complaint Chief Complaint  Patient presents with   Flank Pain    HPI LEONARD FEIGEL is a 63 y.o. male with a past medical history of diabetes, prior DVT and PE, hypertension, hyperlipidemia, obesity presents to ED for evaluation of right-sided flank pain radiating to his right lower quadrant.  States the pain has been intermittent for the past 2 weeks.  He has tried ibuprofen with mild relief in his symptoms.  Pain has been constant for the past 2 hours without specific aggravating or alleviating factor.  Denies any urinary symptoms but is concerned that he may have a kidney stone.  He did have some loose stools over the past few days.  Denies any fever, prior abdominal surgeries, shortness of breath, trauma to the area.     HPI  Past Medical History:  Diagnosis Date   Atrial fibrillation or flutter    Should be on Coumadin but h/o non-compliance; started Sotalol 08/2009   Cellulitis    Diabetes mellitus    DVT (deep venous thrombosis) (HCC)    Recurrent RLE DVT, supposed to be on Coumadin but h/o non-compliance    Hyperlipidemia    Hypertension    Myocardiopathy (HCC)    OSA on CPAP    Pulmonary embolism (HCC)    Sleep apnea    Venous stasis     Patient Active Problem List   Diagnosis Date Noted   Venous stasis ulcer (HCC) 09/19/2015   Persistent atrial fibrillation (HCC) 11/22/2014   Chronic anticoagulation 11/22/2014   Morbid obesity (HCC) 11/22/2014   Atrial fibrillation with RVR (HCC) 12/11/2012   PE (pulmonary embolism) 07/19/2011   Post-thrombotic syndrome 07/19/2011   Atrial fibrillation or flutter    Hypertension    Diabetes mellitus (HCC)    Cellulitis    Venous stasis    DVT (deep venous thrombosis) (HCC)    Sleep apnea    Abnormal electrocardiogram 10/03/2007   Type II or unspecified type  diabetes mellitus without mention of complication, not stated as uncontrolled 10/03/2007   Essential hypertension 10/03/2007    Past Surgical History:  Procedure Laterality Date   LEG SURGERY          Home Medications    Prior to Admission medications   Medication Sig Start Date End Date Taking? Authorizing Provider  furosemide (LASIX) 40 MG tablet Take 40 mg by mouth.   Yes [provider]  Insulin Lispro (HUMALOG [place]) Inject into the skin. Inject on a sliding scale   Yes [provider]  insulin regular (NOVOLIN R,HUMULIN R) 100 units/mL injection Inject into the skin daily. Patient reports injecting 80 units in the morning, 40 units at lunch time and 40 units at night time.   Yes [provider]  liraglutide (VICTOZA) 18 MG/3ML SOPN Inject into the skin.   Yes [provider]  lisinopril (PRINIVIL,ZESTRIL) 5 MG tablet Take 5 mg by mouth daily. 11/02/14  Yes [provider]  metoprolol succinate (TOPROL-XL) 50 MG 24 hr tablet Take 50 mg by mouth daily. 11/02/14  Yes [provider]  XARELTO 20 MG TABS tablet Take 20 mg by mouth daily. 11/02/14  Yes [provider]  HYDROcodone-acetaminophen (NORCO/VICODIN) 5-325 MG tablet Take 1 tablet by mouth every 6 (six) hours as needed. 06/04/19   Davette Nugent, PA-C  methocarbamol (ROBAXIN) 500 MG tablet  Take 1 tablet (500 mg total) by mouth 2 (two) times daily. 06/04/19   Delia Heady, PA-C    Family History Family History  Problem Relation Age of Onset   Hypertension Mother    Diabetes Mellitus I Father    Hypertension Paternal Grandmother    Heart attack Paternal Grandmother     Social History Social History   Tobacco Use   Smoking status: Former Smoker    Quit date: 03/23/1987    Years since quitting: 32.2   Smokeless tobacco: Never Used  Substance Use Topics   Alcohol use: Yes    Comment: occ   Drug use: No     Allergies   Crestor [rosuvastatin], Lipitor  [atorvastatin], and Pravastatin   Review of Systems Review of Systems  Constitutional: Negative for appetite change, chills and fever.  HENT: Negative for ear pain, rhinorrhea, sneezing and sore throat.   Eyes: Negative for photophobia and visual disturbance.  Respiratory: Negative for cough, chest tightness, shortness of breath and wheezing.   Cardiovascular: Negative for chest pain and palpitations.  Gastrointestinal: Positive for abdominal pain. Negative for blood in stool, constipation, diarrhea, nausea and vomiting.  Genitourinary: Positive for flank pain. Negative for dysuria, hematuria and urgency.  Musculoskeletal: Negative for myalgias.  Skin: Negative for rash.  Neurological: Negative for dizziness, weakness and light-headedness.     Physical Exam Updated Vital Signs BP 134/76 (BP Location: Left Arm)    Pulse 75    Temp 98.1 F (36.7 C) (Oral)    Resp 18    Ht 6' (1.829 m)    Wt (!) 147.4 kg    SpO2 98%    BMI 44.08 kg/m   Physical Exam Vitals signs and nursing note reviewed.  Constitutional:      General: He is not in acute distress.    Appearance: He is well-developed.  HENT:     Head: Normocephalic and atraumatic.     Nose: Nose normal.  Eyes:     General: No scleral icterus.       Left eye: No discharge.     Conjunctiva/sclera: Conjunctivae normal.  Neck:     Musculoskeletal: Normal range of motion and neck supple.  Cardiovascular:     Rate and Rhythm: Normal rate and regular rhythm.     Heart sounds: Normal heart sounds. No murmur. No friction rub. No gallop.   Pulmonary:     Effort: Pulmonary effort is normal. No respiratory distress.     Breath sounds: Normal breath sounds.  Abdominal:     General: Bowel sounds are normal. There is no distension.     Palpations: Abdomen is soft.     Tenderness: There is abdominal tenderness (R flank, RLQ). There is no guarding.  Musculoskeletal: Normal range of motion.  Skin:    General: Skin is warm and dry.      Findings: No rash.  Neurological:     Mental Status: He is alert.     Motor: No abnormal muscle tone.     Coordination: Coordination normal.      ED Treatments / Results  Labs (all labs ordered are listed, but only abnormal results are displayed) Labs Reviewed  BASIC METABOLIC PANEL - Abnormal; Notable for the following components:      Result Value   Glucose, Bld 147 (*)    Calcium 8.6 (*)    All other components within normal limits  URINE CULTURE  CBC WITH DIFFERENTIAL/PLATELET  URINALYSIS, ROUTINE W REFLEX MICROSCOPIC  EKG None  Radiology Ct Renal Stone Study  Result Date: 06/04/2019 CLINICAL DATA:  C/o lower mid back pain radiating to rt flank x 2 weeks off and on Past 2 hours pain is worse w some pain to rt groin EXAM: CT ABDOMEN AND PELVIS WITHOUT CONTRAST TECHNIQUE: Multidetector CT imaging of the abdomen and pelvis was performed following the standard protocol without IV contrast. COMPARISON:  None. FINDINGS: Lower chest: Mild coronary artery calcification. Somewhat limited evaluation of the abdominal viscera given the lack of IV contrast. Hepatobiliary: No focal liver abnormality is seen. No gallstones, gallbladder wall thickening, or biliary dilatation. Pancreas: Unremarkable. Spleen: Normal in size without focal abnormality. Adrenals/Urinary Tract: Adrenal glands are unremarkable. There is a 4 mm calculus in the inferior pole the left kidney. No hydronephrosis. No right renal calculi. No large renal mass. Bladder is unremarkable. Stomach/Bowel: Stomach is within normal limits. Appendix appears normal. No evidence of bowel wall thickening, distention, or inflammatory changes. Vascular/Lymphatic: Mild aortoiliac atherosclerotic calcification without evidence of aneurysm. No lymphadenopathy. Reproductive: Prostate is unremarkable. Other: No abdominal wall hernia. No abdominopelvic ascites. Musculoskeletal: Severe multilevel degenerative disc disease in the lumbar spine.  IMPRESSION: 1. Nonobstructive 4 mm calculus in the inferior pole of the left kidney. No right renal calculi or hydronephrosis. 2. No other finding on a noncontrast exam to explain the patient's pain. 3. Severe multilevel degenerative disc disease in the lumbar spine. 4. Mild coronary artery calcification. Aortic Atherosclerosis (ICD10-I70.0). Electronically Signed   By: Emmaline Kluver M.D.   On: 06/04/2019 13:34    Procedures Procedures (including critical care time)  Medications Ordered in ED Medications  morphine 4 MG/ML injection 4 mg (4 mg Intravenous Given 06/04/19 1313)  fentaNYL (SUBLIMAZE) injection 50 mcg (50 mcg Intravenous Given 06/04/19 1519)  methocarbamol (ROBAXIN) tablet 500 mg (500 mg Oral Given 06/04/19 1519)     Initial Impression / Assessment and Plan / ED Course  I have reviewed the triage vital signs and the nursing notes.  Pertinent labs & imaging results that were available during my care of the patient were reviewed by me and considered in my medical decision making (see chart for details).  Clinical Course as of Jun 03 1625  Mon Jun 04, 2019  1622 UA negative for nitrite, leukocytes, ketones, bilirubin, hemoglobin, and normal specific gravity, per lab.  Results unable to be crossed over.   [HK]    Clinical Course User Index [HK] Dietrich Pates, PA-C       63 year old male with past medical history of diabetes, prior DVT and PE, hypertension, hyperlipidemia currently anticoagulated on Xarelto presents to ED for evaluation of right-sided flank pain radiating down to right lower quadrant.  Intermittent pain for past 2 weeks but constant over the past 2 hours.  Concerned that he may have a kidney stone.  Reports loose stools over the past few days.  My exam patient appears somewhat uncomfortable.  Tenderness to palpation of the right flank and right lower quadrant without rebound or guarding.  No lower extremity edema, erythema or calf tenderness are concerning for  DVT.  Equal pulses distally.  Urinalysis unremarkable.  CBC, CMP unremarkable.  CT renal stone study with 4 mm nonobstructing stone on the left side but no explanation for symptoms.  Suspect that symptoms are musculoskeletal in nature.  Pain improved here.  Will have him continue with pain control and muscle relaxer and PCP follow-up.  Patient is hemodynamically stable, in NAD, and able to ambulate in the ED. Evaluation  does not show pathology that would require ongoing emergent intervention or inpatient treatment. I explained the diagnosis to the patient. Pain has been managed and has no complaints prior to discharge. Patient is comfortable with above plan and is stable for discharge at this time. All questions were answered prior to disposition. Strict return precautions for returning to the ED were discussed. Encouraged follow up with PCP.   An After Visit Summary was printed and given to the patient.   Portions of this note were generated with Scientist, clinical (histocompatibility and immunogenetics)Dragon dictation software. Dictation errors may occur despite best attempts at proofreading.   Final Clinical Impressions(s) / ED Diagnoses   Final diagnoses:  Strain of lumbar region, initial encounter  Nephrolithiasis    ED Discharge Orders         Ordered    methocarbamol (ROBAXIN) 500 MG tablet  2 times daily     06/04/19 1624    HYDROcodone-acetaminophen (NORCO/VICODIN) 5-325 MG tablet  Every 6 hours PRN     06/04/19 1624           Dietrich PatesKhatri, Anjannette Gauger, PA-C 06/04/19 1626    Arby BarrettePfeiffer, Marcy, MD 06/06/19 0825

## 2019-06-04 NOTE — ED Notes (Signed)
Pt unable to provide urine sample at this time 

## 2019-06-04 NOTE — ED Notes (Signed)
C/o lower mid back pain radiating to rt flank x 2 weeks off and on  Past 2 hours pain is worse w some pain to rt groin

## 2019-06-04 NOTE — Discharge Instructions (Signed)
Take the medication as directed. Follow-up with your primary care provider. Return to the ED for worsening symptoms, if you develop a fever, chest pain, shortness of breath, leg swelling.

## 2019-06-05 LAB — URINE CULTURE: Culture: 30000 — AB

## 2019-06-06 ENCOUNTER — Telehealth: Payer: Self-pay

## 2019-06-06 NOTE — Progress Notes (Signed)
ED Antimicrobial Stewardship Positive Culture Follow Up   Victor Barnes is an 63 y.o. male who presented to [facility] on 06/04/2019 with a chief complaint of  Chief Complaint  Patient presents with  . Flank Pain    Recent Results (from the past 720 hour(s))  Urine culture     Status: Abnormal   Collection Time: 06/04/19  3:25 PM   Specimen: Urine, Clean Catch  Result Value Ref Range Status   Specimen Description   Final    URINE, CLEAN CATCH Performed at [facility], [address] [place], [place] [address]    Special Requests   Final    NONE Performed at [facility], [address] [place], [place] [address]    Culture (A)  Final    30,000 COLONIES/mL GROUP B STREP(S.AGALACTIAE)ISOLATED TESTING AGAINST S. AGALACTIAE NOT ROUTINELY PERFORMED DUE TO PREDICTABILITY OF AMP/PEN/VAN SUSCEPTIBILITY. Performed at [facility], [place] [address], [place], [place] [address]    Report Status 06/05/2019 FINAL  Final    No urinary symptoms. CT revealed nonobstructive renal stone, not likely t cause pain. Suspected musculoskeletal pain. No antibiotics needed.  ED Provider: Lenn Sink, PA-C   Victor Barnes 06/06/2019, 9:47 AM Clinical Pharmacist Monday - Friday phone -  [phone] Saturday - Sunday phone - [phone]

## 2019-06-06 NOTE — Telephone Encounter (Signed)
NO abx for [facility] ED 10/19/220 per Lenn Sink [facility]

## 2019-10-26 ENCOUNTER — Ambulatory Visit (INDEPENDENT_AMBULATORY_CARE_PROVIDER_SITE_OTHER): Payer: 59 | Admitting: Podiatry

## 2019-10-26 ENCOUNTER — Other Ambulatory Visit: Payer: Self-pay

## 2019-10-26 VITALS — Temp 96.5°F

## 2019-10-26 DIAGNOSIS — M79675 Pain in left toe(s): Secondary | ICD-10-CM | POA: Diagnosis not present

## 2019-10-26 DIAGNOSIS — B351 Tinea unguium: Secondary | ICD-10-CM | POA: Diagnosis not present

## 2019-10-26 DIAGNOSIS — B353 Tinea pedis: Secondary | ICD-10-CM | POA: Diagnosis not present

## 2019-10-26 DIAGNOSIS — E119 Type 2 diabetes mellitus without complications: Secondary | ICD-10-CM | POA: Diagnosis not present

## 2019-10-26 DIAGNOSIS — M79674 Pain in right toe(s): Secondary | ICD-10-CM | POA: Diagnosis not present

## 2019-10-26 DIAGNOSIS — Z794 Long term (current) use of insulin: Secondary | ICD-10-CM

## 2019-10-26 MED ORDER — CICLOPIROX 0.77 % EX GEL: CUTANEOUS | 0 refills | Status: DC

## 2019-10-26 NOTE — Progress Notes (Signed)
Subjective: Victor Barnes presents today referred by Laurann Montana, MD for diabetic foot evaluation.  Patient relates 30 year history of diabetes.  Patient relates episode of cellulitis/abscess right lower extremity in 2004 requiring surgical incision and drainage and IV antibiotics.   Also has h/o venous stasis ulceration and DVT of RLE.  Patient admits to numbness, pins/needles and "lightning strike" feeling.  Today, patient c/o of painful, discolored, thick toenails which interfere with daily activities.  Pain is aggravated when wearing enclosed shoe gear.   Past Medical History:  Diagnosis Date  . Atrial fibrillation or flutter    Should be on Coumadin but h/o non-compliance; started Sotalol 08/2009  . Cellulitis   . Diabetes mellitus   . DVT (deep venous thrombosis) (HCC)    Recurrent RLE DVT, supposed to be on Coumadin but h/o non-compliance   . Hyperlipidemia   . Hypertension   . Myocardiopathy (HCC)   . OSA on CPAP   . Pulmonary embolism (HCC)   . Sleep apnea   . Venous stasis     Patient Active Problem List   Diagnosis Date Noted  . Venous stasis ulcer (HCC) 09/19/2015  . Persistent atrial fibrillation (HCC) 11/22/2014  . Chronic anticoagulation 11/22/2014  . Morbid obesity (HCC) 11/22/2014  . Atrial fibrillation with RVR (HCC) 12/11/2012  . PE (pulmonary embolism) 07/19/2011  . Post-thrombotic syndrome 07/19/2011  . Atrial fibrillation or flutter   . Hypertension   . Diabetes mellitus (HCC)   . Cellulitis   . Venous stasis   . DVT (deep venous thrombosis) (HCC)   . Sleep apnea   . Abnormal electrocardiogram 10/03/2007  . Type II or unspecified type diabetes mellitus without mention of complication, not stated as uncontrolled 10/03/2007  . Essential hypertension 10/03/2007    Past Surgical History:  Procedure Laterality Date  . LEG SURGERY      Current Outpatient Medications on File Prior to Visit  Medication Sig Dispense Refill  . furosemide  (LASIX) 40 MG tablet Take 40 mg by mouth.    Marland Kitchen HYDROcodone-acetaminophen (NORCO/VICODIN) 5-325 MG tablet Take 1 tablet by mouth every 6 (six) hours as needed. 6 tablet 0  . insulin regular (NOVOLIN R,HUMULIN R) 100 units/mL injection Inject into the skin daily. Patient reports injecting 80 units in the morning, 40 units at lunch time and 40 units at night time.    . liraglutide (VICTOZA) 18 MG/3ML SOPN Inject into the skin.    Marland Kitchen lisinopril (PRINIVIL,ZESTRIL) 5 MG tablet Take 5 mg by mouth daily.  1  . metoprolol succinate (TOPROL-XL) 50 MG 24 hr tablet Take 50 mg by mouth daily.  1  . XARELTO 20 MG TABS tablet Take 20 mg by mouth daily.  0   No current facility-administered medications on file prior to visit.     Allergies  Allergen Reactions  . Crestor [Rosuvastatin] Other (See Comments)    myalgias  . Lipitor [Atorvastatin] Other (See Comments)    myalgias  . Pravastatin Other (See Comments)    myalgias    Social History   Occupational History  . Not on file  Tobacco Use  . Smoking status: Former Smoker    Quit date: 03/23/1987    Years since quitting: 32.6  . Smokeless tobacco: Never Used  Substance and Sexual Activity  . Alcohol use: Yes    Comment: occ  . Drug use: No  . Sexual activity: Not on file    Family History  Problem Relation Age of Onset  .  Hypertension Mother   . Diabetes Mellitus I Father   . Hypertension Paternal Grandmother   . Heart attack Paternal Grandmother     There is no immunization history for the selected administration types on file for this patient.  Review of systems: Positive Findings in bold print.  Constitutional:  chills, fatigue, fever, sweats, weight change Communication: Optometrist, sign Ecologist, hand writing, iPad/Android device Head: headaches, head injury Eyes: changes in vision, eye pain, glaucoma, cataracts, macular degeneration, diplopia, glare,  light sensitivity, eyeglasses or contacts, blindness Ears nose  mouth throat: hearing impaired, hearing aids,  ringing in ears, deaf, sign language,  vertigo, nosebleeds,  rhinitis,  cold sores, snoring, swollen glands Cardiovascular: HTN, edema, arrhythmia, pacemaker in place, defibrillator in place, chest pain/tightness, chronic anticoagulation, blood clot, heart failure, MI Peripheral Vascular: leg cramps, varicose veins, blood clots, lymphedema, varicosities Respiratory:  difficulty breathing, denies congestion, SOB, wheezing, cough, emphysema Gastrointestinal: change in appetite or weight, abdominal pain, constipation, diarrhea, nausea, vomiting, vomiting blood, change in bowel habits, abdominal pain, jaundice, rectal bleeding, hemorrhoids, GERD Genitourinary:  nocturia,  pain on urination, polyuria,  blood in urine, Foley catheter, urinary urgency, ESRD on hemodialysis Musculoskeletal: amputation, cramping, stiff joints, painful joints, decreased joint motion, fractures, OA, gout, hemiplegia, paraplegia, uses cane, wheelchair bound, uses walker, uses rollator Skin: +changes in toenails, color change, dryness, itching, mole changes,  rash, wound(s) Neurological: headaches, numbness in feet, paresthesias in feet, burning in feet, fainting,  seizures, change in speech,  headaches, memory problems/poor historian, cerebral palsy, weakness, paralysis, CVA, TIA Endocrine: diabetes, hypothyroidism, hyperthyroidism,  goiter, dry mouth, flushing, heat intolerance,  cold intolerance,  excessive thirst, denies polyuria,  nocturia Hematological:  easy bleeding, excessive bleeding, easy bruising, enlarged lymph nodes, on long term blood thinner, history of past transusions Allergy/immunological:  hives, eczema, frequent infections, multiple drug allergies, seasonal allergies, transplant recipient, multiple food allergies Psychiatric:  anxiety, depression, mood disorder, suicidal ideations, hallucinations, insomnia  Objective: Vitals:   10/26/19 0811  Temp: (!) 96.5 F  (35.8 C)    64 y.o. Caucasian male, morbidly obese IN NAD. AAO X 3.  Capillary fill time to digits <3 seconds b/l. Palpable DP pulses b/l. Palpable PT pulses b/l. Pedal hair sparse b/l. Skin temperature gradient within normal limits b/l. Evidence of chronic venous insufficiency b/l LE.  Pedal skin with normal turgor, texture and tone bilaterally. No open wounds bilaterally. Toenails 1-5 b/l elongated, dystrophic, thickened, crumbly with subungual debris and tenderness to dorsal palpation. Scarring noted right medial lower leg healed. Interdigital maceration noted webspaces 1-4 b/l. No breaks in skin. No edema, no erythema, no drainage, no flocculence nor signs of deep space infection.. Diffuse scaling noted peripherally and plantarly b/l feet with mild foot odor.  No interdigital macerations.  No blisters, no weeping. No signs of secondary bacterial infection noted.  Normal muscle strength 5/5 to all lower extremity muscle groups bilaterally, no gross bony deformities bilaterally and no pain crepitus or joint limitation noted with ROM b/l  Protective sensation intact 5/5 intact bilaterally with 10g monofilament b/l Vibratory sensation intact b/l Proprioception intact bilaterally  1. Pain due to onychomycosis of toenails of both feet   2. Tinea pedis of both feet   3. Type 2 diabetes mellitus without complication, with long-term current use of insulin ([place])   4. Encounter for diabetic foot exam ([place])     -Diabetic foot examination performed on today's visit. -Discussed diabetic foot care principles. Literature dispensed on today. -Patient counseled on interdigital tinea pedis.  Advised to dry between toes well after baths/showers. Rx for Ciclopirox Gel 0.77% to be applied between toes and under toes twice daily. -Toenails 1-5 b/l were debrided in length and girth with sterile nail nippers and dremel without iatrogenic bleeding.  -Patient to continue soft, supportive shoe gear daily. -Patient to  report any pedal injuries to medical professional immediately. -Patient/POA to call should there be question/concern in the interim.  Return in about 3 months (around 01/26/2020) for diabetic nail trim.

## 2019-10-26 NOTE — Patient Instructions (Signed)
Diabetes Mellitus and Foot Care Foot care is an important part of your health, especially when you have diabetes. Diabetes may cause you to have problems because of poor blood flow (circulation) to your feet and legs, which can cause your skin to:  Become thinner and drier.  Break more easily.  Heal more slowly.  Peel and crack. You may also have nerve damage (neuropathy) in your legs and feet, causing decreased feeling in them. This means that you may not notice minor injuries to your feet that could lead to more serious problems. Noticing and addressing any potential problems early is the best way to prevent future foot problems. How to care for your feet Foot hygiene  Wash your feet daily with warm water and mild soap. Do not use hot water. Then, pat your feet and the areas between your toes until they are completely dry. Do not soak your feet as this can dry your skin.  Trim your toenails straight across. Do not dig under them or around the cuticle. File the edges of your nails with an emery board or nail file.  Apply a moisturizing lotion or petroleum jelly to the skin on your feet and to dry, brittle toenails. Use lotion that does not contain alcohol and is unscented. Do not apply lotion between your toes. Shoes and socks  Wear clean socks or stockings every day. Make sure they are not too tight. Do not wear knee-high stockings since they may decrease blood flow to your legs.  Wear shoes that fit properly and have enough cushioning. Always look in your shoes before you put them on to be sure there are no objects inside.  To break in new shoes, wear them for just a few hours a day. This prevents injuries on your feet. Wounds, scrapes, corns, and calluses  Check your feet daily for blisters, cuts, bruises, sores, and redness. If you cannot see the bottom of your feet, use a mirror or ask someone for help.  Do not cut corns or calluses or try to remove them with medicine.  If you  find a minor scrape, cut, or break in the skin on your feet, keep it and the skin around it clean and dry. You may clean these areas with mild soap and water. Do not clean the area with peroxide, alcohol, or iodine.  If you have a wound, scrape, corn, or callus on your foot, look at it several times a day to make sure it is healing and not infected. Check for: ? Redness, swelling, or pain. ? Fluid or blood. ? Warmth. ? Pus or a bad smell. General instructions  Do not cross your legs. This may decrease blood flow to your feet.  Do not use heating pads or hot water bottles on your feet. They may burn your skin. If you have lost feeling in your feet or legs, you may not know this is happening until it is too late.  Protect your feet from hot and cold by wearing shoes, such as at the beach or on hot pavement.  Schedule a complete foot exam at least once a year (annually) or more often if you have foot problems. If you have foot problems, report any cuts, sores, or bruises to your health care provider immediately. Contact a health care provider if:  You have a medical condition that increases your risk of infection and you have any cuts, sores, or bruises on your feet.  You have an injury that is not   healing.  You have redness on your legs or feet.  You feel burning or tingling in your legs or feet.  You have pain or cramps in your legs and feet.  Your legs or feet are numb.  Your feet always feel cold.  You have pain around a toenail. Get help right away if:  You have a wound, scrape, corn, or callus on your foot and: ? You have pain, swelling, or redness that gets worse. ? You have fluid or blood coming from the wound, scrape, corn, or callus. ? Your wound, scrape, corn, or callus feels warm to the touch. ? You have pus or a bad smell coming from the wound, scrape, corn, or callus. ? You have a fever. ? You have a red line going up your leg. Summary  Check your feet every day  for cuts, sores, red spots, swelling, and blisters.  Moisturize feet and legs daily.  Wear shoes that fit properly and have enough cushioning.  If you have foot problems, report any cuts, sores, or bruises to your health care provider immediately.  Schedule a complete foot exam at least once a year (annually) or more often if you have foot problems. This information is not intended to replace advice given to you by your health care provider. Make sure you discuss any questions you have with your health care provider. Document Revised: 04/25/2019 Document Reviewed: 09/03/2016 Elsevier Patient Education  2020 [employer]  

## 2019-10-30 ENCOUNTER — Encounter: Payer: Self-pay | Admitting: Podiatry

## 2019-12-31 IMAGING — CT CT RENAL STONE PROTOCOL
1 series · 15 of 32 positions shown, 19 images · non-contrast
Comparison: None.

CLINICAL DATA: C/o lower mid back pain radiating to rt flank x 2
weeks off and on Past 2 hours pain is worse w some pain to rt groin

EXAM:
CT ABDOMEN AND PELVIS WITHOUT CONTRAST
TECHNIQUE: Multidetector CT imaging of the abdomen and pelvis was performed
following the standard protocol without IV contrast.

[Series 2: axial st · axial · 0.98mm/px · z∈[-587,-27]mm · 15 of 126 slices shown, 19 images]
[im 9/126  soft-tissue]
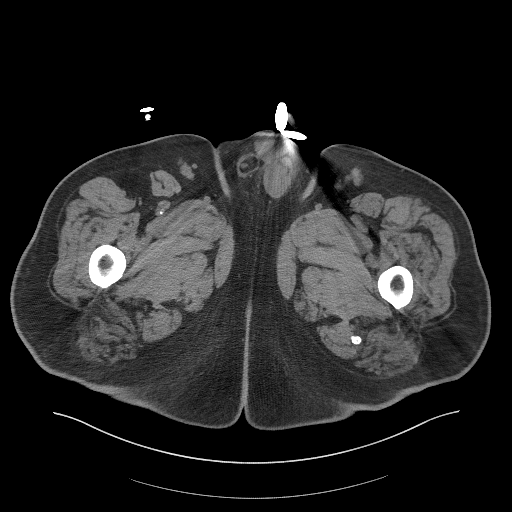
[im 9/126  bone]
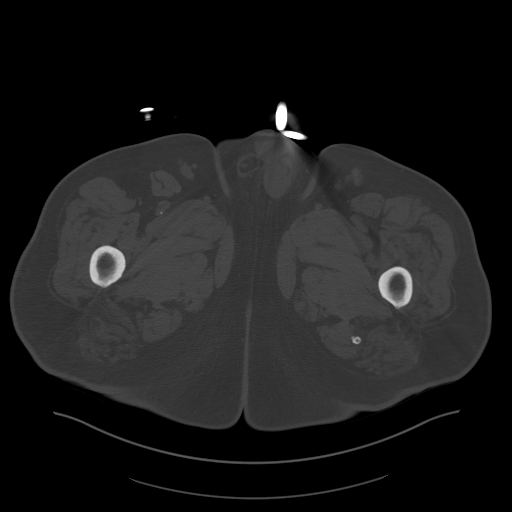
[im 17/126  soft-tissue]
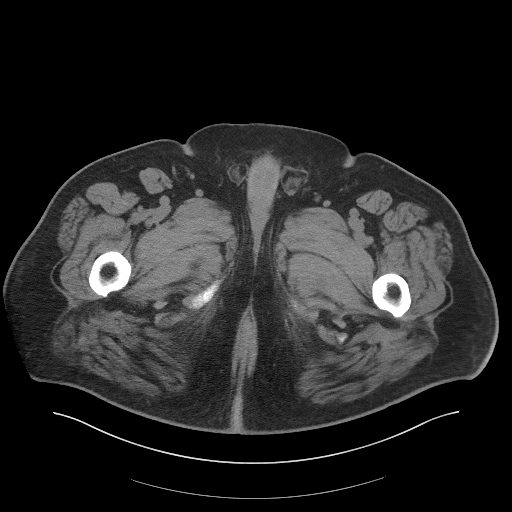
[im 25/126  soft-tissue]
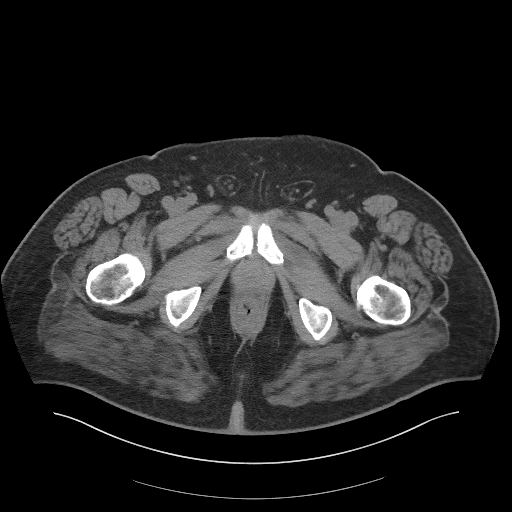
[im 37/126  soft-tissue]
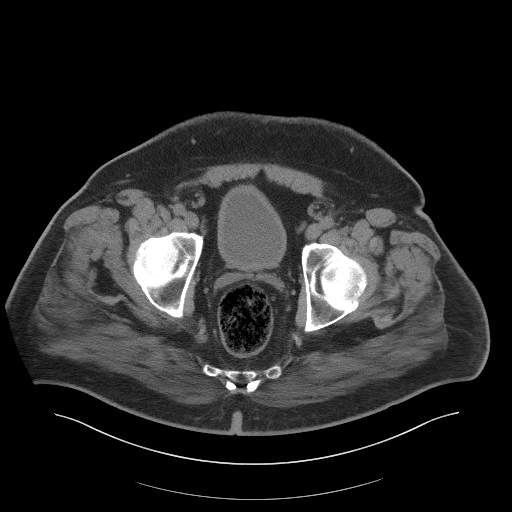
[im 45/126  soft-tissue]
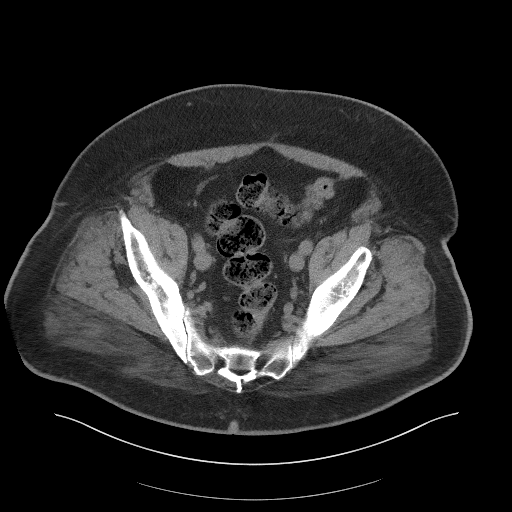
[im 53/126  soft-tissue]
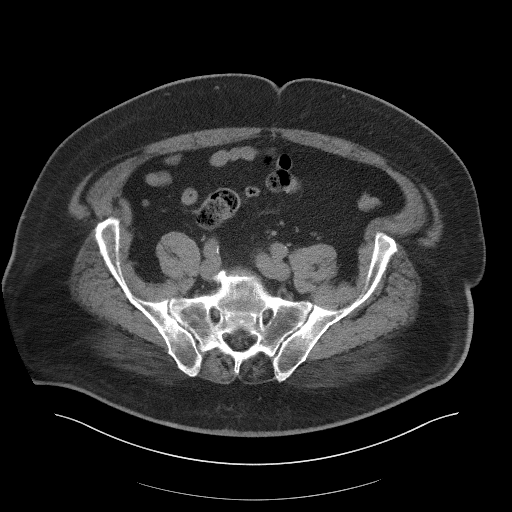
[im 65/126  soft-tissue]
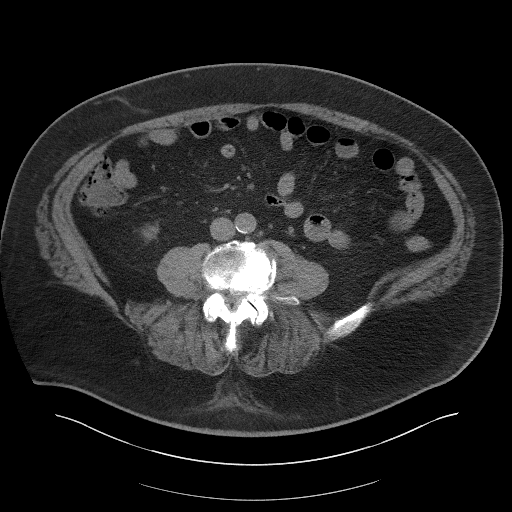
[im 73/126  soft-tissue]
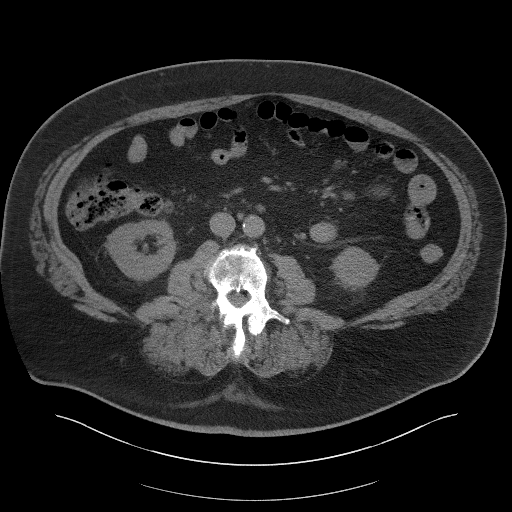
[im 81/126  soft-tissue]
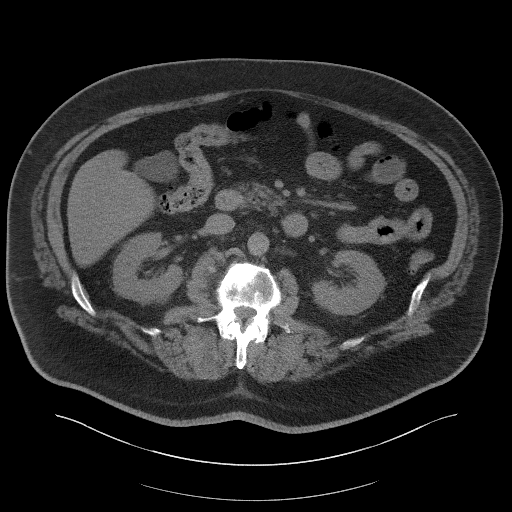
[im 81/126  bone]
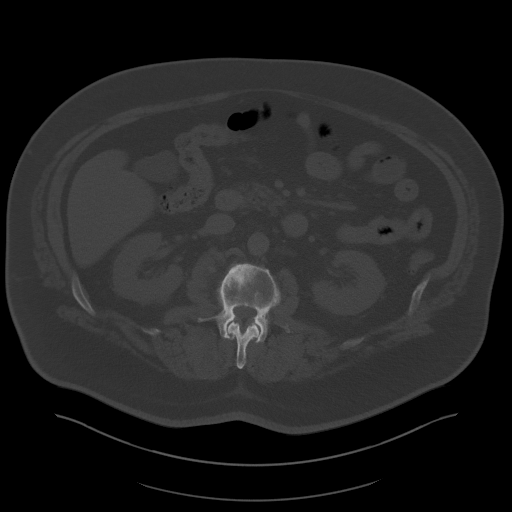
[im 89/126  soft-tissue]
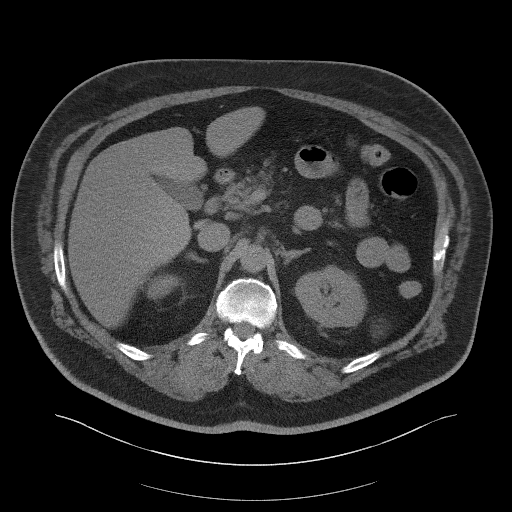
[im 101/126  soft-tissue]
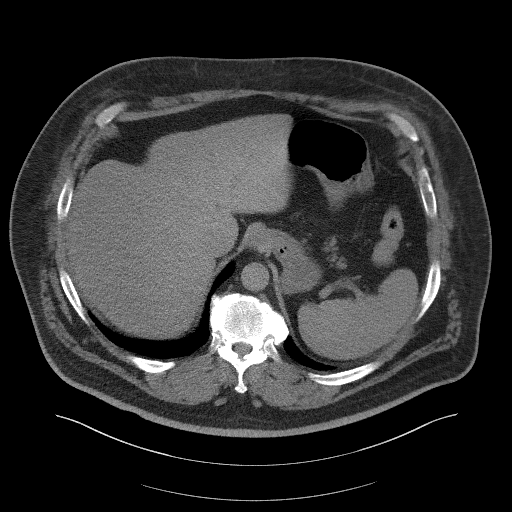
[im 109/126  soft-tissue]
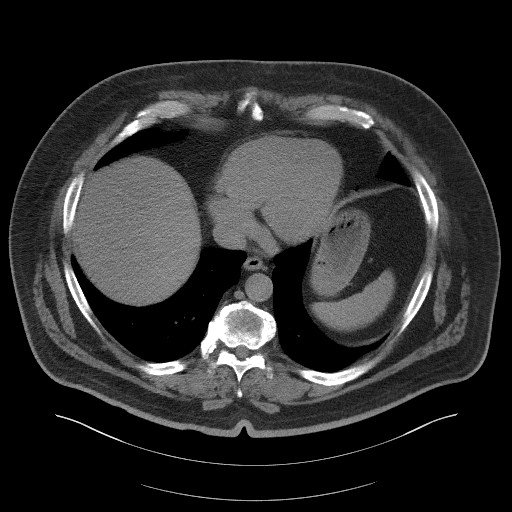
[im 109/126  lung]
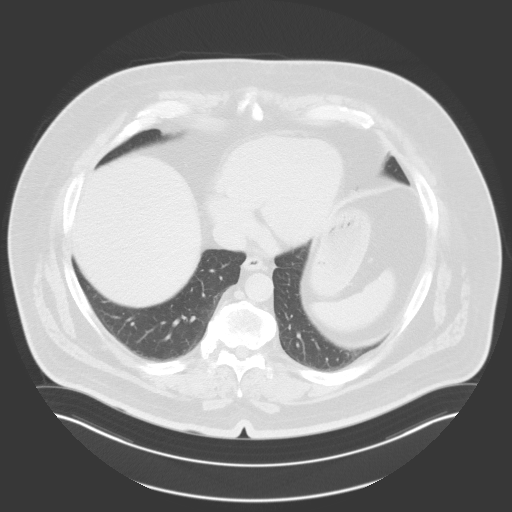
[im 113/126  lung]
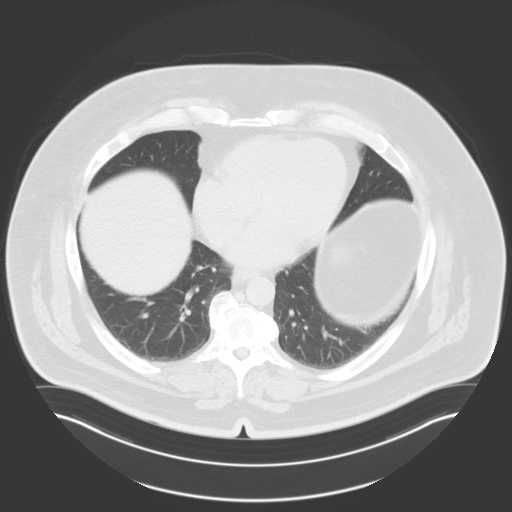
[im 117/126  soft-tissue]
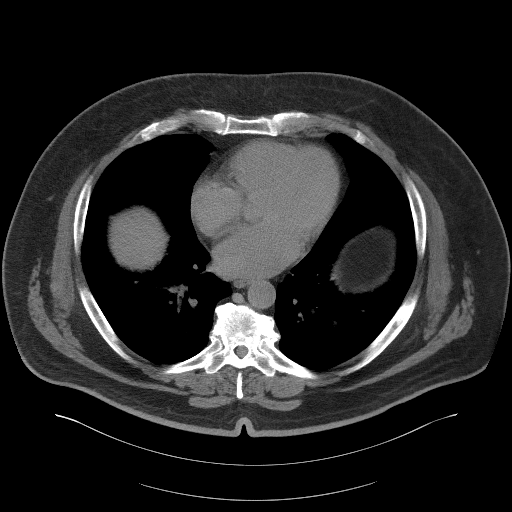
[im 117/126  lung]
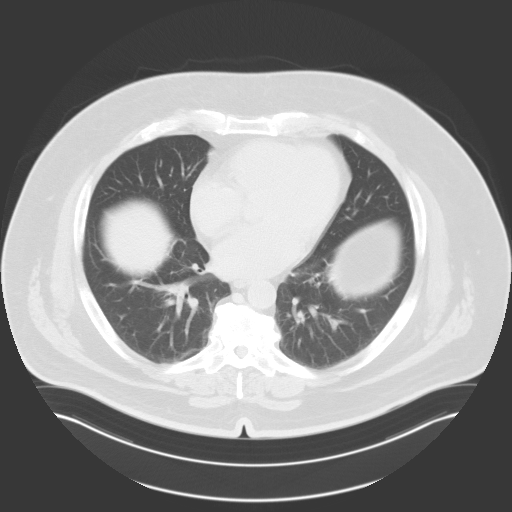
[im 121/126  lung]
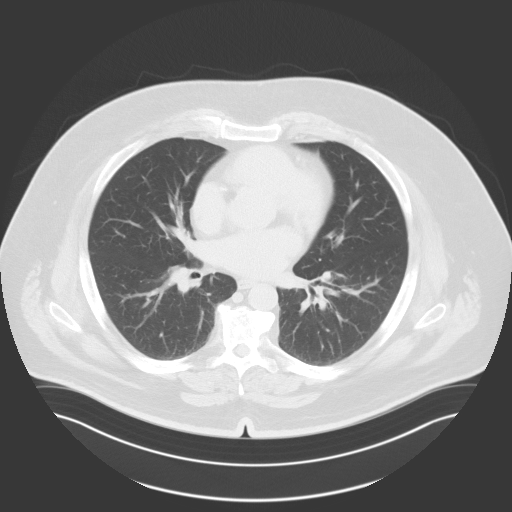

[15 of 32 positions shown; findings below may reference images not displayed]

FINDINGS: Lower chest: Mild coronary artery calcification.

Somewhat limited evaluation of the abdominal viscera given the lack
of IV contrast.

Hepatobiliary: No focal liver abnormality is seen. No gallstones,
gallbladder wall thickening, or biliary dilatation.

Pancreas: Unremarkable.

Spleen: Normal in size without focal abnormality.

Adrenals/Urinary Tract: Adrenal glands are unremarkable. There is a
4 mm calculus in the inferior pole the left kidney. No
hydronephrosis. No right renal calculi. No large renal mass.
Bladder is unremarkable.

Stomach/Bowel: Stomach is within normal limits. Appendix appears
normal. No evidence of bowel wall thickening, distention, or
inflammatory changes.

Vascular/Lymphatic: Mild aortoiliac atherosclerotic calcification
without evidence of aneurysm. No lymphadenopathy.

Reproductive: Prostate is unremarkable.

Other: No abdominal wall hernia. No abdominopelvic ascites.

Musculoskeletal: Severe multilevel degenerative disc disease in the
lumbar spine.
IMPRESSION: 1. Nonobstructive 4 mm calculus in the inferior pole of the left
kidney. No right renal calculi or hydronephrosis.
2. No other finding on a noncontrast exam to explain the patient's
pain.
3. Severe multilevel degenerative disc disease in the lumbar spine.
4. Mild coronary artery calcification.

Aortic Atherosclerosis ([id number]).

## 2020-02-01 ENCOUNTER — Encounter: Payer: Self-pay | Admitting: Podiatry

## 2020-02-01 ENCOUNTER — Ambulatory Visit (INDEPENDENT_AMBULATORY_CARE_PROVIDER_SITE_OTHER): Payer: 59 | Admitting: Podiatry

## 2020-02-01 ENCOUNTER — Other Ambulatory Visit: Payer: Self-pay

## 2020-02-01 DIAGNOSIS — B351 Tinea unguium: Secondary | ICD-10-CM | POA: Diagnosis not present

## 2020-02-01 DIAGNOSIS — E119 Type 2 diabetes mellitus without complications: Secondary | ICD-10-CM

## 2020-02-01 DIAGNOSIS — B353 Tinea pedis: Secondary | ICD-10-CM

## 2020-02-01 DIAGNOSIS — M79674 Pain in right toe(s): Secondary | ICD-10-CM

## 2020-02-01 DIAGNOSIS — M79675 Pain in left toe(s): Secondary | ICD-10-CM | POA: Diagnosis not present

## 2020-02-01 DIAGNOSIS — Z794 Long term (current) use of insulin: Secondary | ICD-10-CM | POA: Diagnosis not present

## 2020-02-01 MED ORDER — ATHLETES FOOT POWDER SPRAY 2 % EX AERP: INHALATION_SPRAY | CUTANEOUS | 5 refills | Status: DC

## 2020-02-01 NOTE — Progress Notes (Signed)
Subjective: Victor Barnes is a pleasant 64 y.o. male patient seen today preventative diabetic foot care and painful mycotic nails b/l that are difficult to trim. Pain interferes with ambulation. Aggravating factors include wearing enclosed shoe gear. Pain is relieved with periodic professional debridement.  Past Medical History:  Diagnosis Date  . Atrial fibrillation or flutter    Should be on Coumadin but h/o non-compliance; started Sotalol 08/2009  . Cellulitis   . Diabetes mellitus   . DVT (deep venous thrombosis) (HCC)    Recurrent RLE DVT, supposed to be on Coumadin but h/o non-compliance   . Hyperlipidemia   . Hypertension   . Myocardiopathy ([place])   . OSA on CPAP   . Pulmonary embolism ([place])   . Sleep apnea   . Venous stasis     Patient Active Problem List   Diagnosis Date Noted  . Venous stasis ulcer ([place]) 09/19/2015  . Persistent atrial fibrillation ([place]) 11/22/2014  . Chronic anticoagulation 11/22/2014  . Morbid obesity ([place]) 11/22/2014  . Atrial fibrillation with RVR ([place]) 12/11/2012  . PE (pulmonary embolism) 07/19/2011  . Post-thrombotic syndrome 07/19/2011  . Atrial fibrillation or flutter   . Hypertension   . Diabetes mellitus ([place])   . Cellulitis   . Venous stasis   . DVT (deep venous thrombosis) ([place])   . Sleep apnea   . Abnormal electrocardiogram 10/03/2007  . Type II or unspecified type diabetes mellitus without mention of complication, not stated as uncontrolled 10/03/2007  . Essential hypertension 10/03/2007    Current Outpatient Medications on File Prior to Visit  Medication Sig Dispense Refill  . ciclopirox (LOPROX) 0.77 % cream Apply topically 2 (two) times daily.    . furosemide (LASIX) 40 MG tablet Take 40 mg by mouth.    Marland Kitchen HYDROcodone-acetaminophen (NORCO/VICODIN) 5-325 MG tablet Take 1 tablet by mouth every 6 (six) hours as needed. 6 tablet 0  . insulin regular (NOVOLIN R,HUMULIN R) 100 units/mL injection Inject into the skin daily. Patient  reports injecting 80 units in the morning, 40 units at lunch time and 40 units at night time.    . liraglutide (VICTOZA) 18 MG/3ML SOPN Inject into the skin.    Marland Kitchen lisinopril (PRINIVIL,ZESTRIL) 5 MG tablet Take 5 mg by mouth daily.  1  . metoprolol succinate (TOPROL-XL) 50 MG 24 hr tablet Take 50 mg by mouth daily.  1  . XARELTO 20 MG TABS tablet Take 20 mg by mouth daily.  0   No current facility-administered medications on file prior to visit.    Allergies  Allergen Reactions  . Crestor [Rosuvastatin] Other (See Comments)    myalgias  . Lipitor [Atorvastatin] Other (See Comments)    myalgias  . Pravastatin Other (See Comments)    myalgias    Objective: Physical Exam  General: Victor Barnes is a pleasant 64 y.o. Caucasian male, in NAD. AAO x 3.   Vascular:  Neurovascular status unchanged b/l lower extremities. Capillary fill time to digits <3 seconds b/l lower extremities. Palpable pedal pulses b/l LE. Pedal hair sparse. Evidence of chronic venous insufficiency b/l LE.  Dermatological:  Pedal skin with normal turgor, texture and tone bilaterally. No open wounds bilaterally. Interdigital maceration noted webspace(s) 1-4 b/l. No blistering, no weeping, no open wounds. Toenails 1-5 b/l elongated, discolored, dystrophic, thickened, crumbly with subungual debris and tenderness to dorsal palpation.  Musculoskeletal:  Normal muscle strength 5/5 to all lower extremity muscle groups bilaterally. No pain crepitus or joint limitation noted with ROM  b/l. No gross bony deformities bilaterally. Patient ambulates independent of any assistive aids.  Neurological:  Protective sensation intact 5/5 intact bilaterally with 10g monofilament b/l. Vibratory sensation intact b/l. Proprioception intact bilaterally.  Assessment and Plan:  1. Pain due to onychomycosis of toenails of both feet   2. Tinea pedis of both feet   3. Type 2 diabetes mellitus without complication, with long-term current use  of insulin (HCC)    -Examined patient. -Continue diabetic foot care principles. -Toenails 1-5 b/l were debrided in length and girth with sterile nail nippers and dremel without iatrogenic bleeding.  -Patient to report any pedal injuries to medical professional immediately. -Rx for 2% antifungal spray powder to be applied between toes bid for interdigital tinea pedis. -Patient to continue soft, supportive shoe gear daily. -Patient/POA to call should there be question/concern in the interim.  Return in about 3 months (around 05/03/2020) for diabetic nail trim/ Xarelto.  Freddie Breech, DPM

## 2020-02-01 NOTE — Patient Instructions (Signed)
Diabetes Mellitus and Foot Care Foot care is an important part of your health, especially when you have diabetes. Diabetes may cause you to have problems because of poor blood flow (circulation) to your feet and legs, which can cause your skin to:  Become thinner and drier.  Break more easily.  Heal more slowly.  Peel and crack. You may also have nerve damage (neuropathy) in your legs and feet, causing decreased feeling in them. This means that you may not notice minor injuries to your feet that could lead to more serious problems. Noticing and addressing any potential problems early is the best way to prevent future foot problems. How to care for your feet Foot hygiene  Wash your feet daily with warm water and mild soap. Do not use hot water. Then, pat your feet and the areas between your toes until they are completely dry. Do not soak your feet as this can dry your skin.  Trim your toenails straight across. Do not dig under them or around the cuticle. File the edges of your nails with an emery board or nail file.  Apply a moisturizing lotion or petroleum jelly to the skin on your feet and to dry, brittle toenails. Use lotion that does not contain alcohol and is unscented. Do not apply lotion between your toes. Shoes and socks  Wear clean socks or stockings every day. Make sure they are not too tight. Do not wear knee-high stockings since they may decrease blood flow to your legs.  Wear shoes that fit properly and have enough cushioning. Always look in your shoes before you put them on to be sure there are no objects inside.  To break in new shoes, wear them for just a few hours a day. This prevents injuries on your feet. Wounds, scrapes, corns, and calluses  Check your feet daily for blisters, cuts, bruises, sores, and redness. If you cannot see the bottom of your feet, use a mirror or ask someone for help.  Do not cut corns or calluses or try to remove them with medicine.  If you  find a minor scrape, cut, or break in the skin on your feet, keep it and the skin around it clean and dry. You may clean these areas with mild soap and water. Do not clean the area with peroxide, alcohol, or iodine.  If you have a wound, scrape, corn, or callus on your foot, look at it several times a day to make sure it is healing and not infected. Check for: ? Redness, swelling, or pain. ? Fluid or blood. ? Warmth. ? Pus or a bad smell. General instructions  Do not cross your legs. This may decrease blood flow to your feet.  Do not use heating pads or hot water bottles on your feet. They may burn your skin. If you have lost feeling in your feet or legs, you may not know this is happening until it is too late.  Protect your feet from hot and cold by wearing shoes, such as at the beach or on hot pavement.  Schedule a complete foot exam at least once a year (annually) or more often if you have foot problems. If you have foot problems, report any cuts, sores, or bruises to your health care provider immediately. Contact a health care provider if:  You have a medical condition that increases your risk of infection and you have any cuts, sores, or bruises on your feet.  You have an injury that is not   healing.  You have redness on your legs or feet.  You feel burning or tingling in your legs or feet.  You have pain or cramps in your legs and feet.  Your legs or feet are numb.  Your feet always feel cold.  You have pain around a toenail. Get help right away if:  You have a wound, scrape, corn, or callus on your foot and: ? You have pain, swelling, or redness that gets worse. ? You have fluid or blood coming from the wound, scrape, corn, or callus. ? Your wound, scrape, corn, or callus feels warm to the touch. ? You have pus or a bad smell coming from the wound, scrape, corn, or callus. ? You have a fever. ? You have a red line going up your leg. Summary  Check your feet every day  for cuts, sores, red spots, swelling, and blisters.  Moisturize feet and legs daily.  Wear shoes that fit properly and have enough cushioning.  If you have foot problems, report any cuts, sores, or bruises to your health care provider immediately.  Schedule a complete foot exam at least once a year (annually) or more often if you have foot problems. This information is not intended to replace advice given to you by your health care provider. Make sure you discuss any questions you have with your health care provider. Document Revised: 04/25/2019 Document Reviewed: 09/03/2016 Elsevier Patient Education  2020 [employer]  

## 2020-05-09 ENCOUNTER — Ambulatory Visit: Payer: 59 | Admitting: Podiatry

## 2021-03-31 DIAGNOSIS — H52203 Unspecified astigmatism, bilateral: Secondary | ICD-10-CM | POA: Diagnosis not present

## 2021-04-13 DIAGNOSIS — H26491 Other secondary cataract, right eye: Secondary | ICD-10-CM | POA: Diagnosis not present

## 2021-04-27 DIAGNOSIS — H26492 Other secondary cataract, left eye: Secondary | ICD-10-CM | POA: Diagnosis not present

## 2021-04-30 DIAGNOSIS — M5416 Radiculopathy, lumbar region: Secondary | ICD-10-CM | POA: Diagnosis not present

## 2021-09-11 DIAGNOSIS — E785 Hyperlipidemia, unspecified: Secondary | ICD-10-CM | POA: Diagnosis not present

## 2021-09-11 DIAGNOSIS — I480 Paroxysmal atrial fibrillation: Secondary | ICD-10-CM | POA: Diagnosis not present

## 2021-09-11 DIAGNOSIS — R0602 Shortness of breath: Secondary | ICD-10-CM | POA: Diagnosis not present

## 2021-09-11 DIAGNOSIS — E1165 Type 2 diabetes mellitus with hyperglycemia: Secondary | ICD-10-CM | POA: Diagnosis not present

## 2021-09-28 DIAGNOSIS — R051 Acute cough: Secondary | ICD-10-CM | POA: Diagnosis not present

## 2021-09-28 DIAGNOSIS — J329 Chronic sinusitis, unspecified: Secondary | ICD-10-CM | POA: Diagnosis not present

## 2021-10-06 ENCOUNTER — Other Ambulatory Visit: Payer: Self-pay

## 2021-10-06 ENCOUNTER — Encounter (HOSPITAL_COMMUNITY): Payer: Self-pay | Admitting: Physician Assistant

## 2021-10-06 ENCOUNTER — Ambulatory Visit (HOSPITAL_COMMUNITY)
Admission: RE | Admit: 2021-10-06 | Discharge: 2021-10-06 | Disposition: A | Discharge status: Home / Self Care | Payer: Medicare Other | Source: Ambulatory Visit | Attending: Physician Assistant | Admitting: Physician Assistant

## 2021-10-06 VITALS — BP 166/86 | HR 111 | Ht 72.0 in | Wt 375.4 lb

## 2021-10-06 DIAGNOSIS — J329 Chronic sinusitis, unspecified: Secondary | ICD-10-CM | POA: Diagnosis not present

## 2021-10-06 DIAGNOSIS — Z9989 Dependence on other enabling machines and devices: Secondary | ICD-10-CM | POA: Insufficient documentation

## 2021-10-06 DIAGNOSIS — I4891 Unspecified atrial fibrillation: Secondary | ICD-10-CM | POA: Diagnosis not present

## 2021-10-06 DIAGNOSIS — Z86718 Personal history of other venous thrombosis and embolism: Secondary | ICD-10-CM | POA: Insufficient documentation

## 2021-10-06 DIAGNOSIS — R0989 Other specified symptoms and signs involving the circulatory and respiratory systems: Secondary | ICD-10-CM | POA: Insufficient documentation

## 2021-10-06 DIAGNOSIS — Z7901 Long term (current) use of anticoagulants: Secondary | ICD-10-CM | POA: Diagnosis not present

## 2021-10-06 DIAGNOSIS — E785 Hyperlipidemia, unspecified: Secondary | ICD-10-CM | POA: Diagnosis not present

## 2021-10-06 DIAGNOSIS — I4510 Unspecified right bundle-branch block: Secondary | ICD-10-CM | POA: Insufficient documentation

## 2021-10-06 DIAGNOSIS — R0602 Shortness of breath: Secondary | ICD-10-CM | POA: Diagnosis not present

## 2021-10-06 DIAGNOSIS — I878 Other specified disorders of veins: Secondary | ICD-10-CM | POA: Diagnosis not present

## 2021-10-06 DIAGNOSIS — I5022 Chronic systolic (congestive) heart failure: Secondary | ICD-10-CM | POA: Diagnosis not present

## 2021-10-06 DIAGNOSIS — Z79899 Other long term (current) drug therapy: Secondary | ICD-10-CM | POA: Insufficient documentation

## 2021-10-06 DIAGNOSIS — Z6841 Body Mass Index (BMI) 40.0 and over, adult: Secondary | ICD-10-CM | POA: Insufficient documentation

## 2021-10-06 DIAGNOSIS — D6869 Other thrombophilia: Secondary | ICD-10-CM | POA: Insufficient documentation

## 2021-10-06 DIAGNOSIS — R059 Cough, unspecified: Secondary | ICD-10-CM | POA: Diagnosis not present

## 2021-10-06 DIAGNOSIS — I4821 Permanent atrial fibrillation: Secondary | ICD-10-CM | POA: Diagnosis not present

## 2021-10-06 DIAGNOSIS — R000 Tachycardia, unspecified: Secondary | ICD-10-CM | POA: Diagnosis not present

## 2021-10-06 DIAGNOSIS — I110 Hypertensive heart disease with heart failure: Secondary | ICD-10-CM | POA: Diagnosis not present

## 2021-10-06 DIAGNOSIS — I4892 Unspecified atrial flutter: Secondary | ICD-10-CM | POA: Diagnosis not present

## 2021-10-06 DIAGNOSIS — E119 Type 2 diabetes mellitus without complications: Secondary | ICD-10-CM | POA: Diagnosis not present

## 2021-10-06 DIAGNOSIS — Z7182 Exercise counseling: Secondary | ICD-10-CM | POA: Insufficient documentation

## 2021-10-06 DIAGNOSIS — E669 Obesity, unspecified: Secondary | ICD-10-CM | POA: Diagnosis not present

## 2021-10-06 DIAGNOSIS — G4733 Obstructive sleep apnea (adult) (pediatric): Secondary | ICD-10-CM | POA: Insufficient documentation

## 2021-10-06 DIAGNOSIS — R051 Acute cough: Secondary | ICD-10-CM | POA: Diagnosis not present

## 2021-10-06 NOTE — Patient Instructions (Signed)
Take lasix daily for the next 3 days then back to as needed for weight gain/swelling  Increase metoprolol to 50mg  twice a day

## 2021-10-06 NOTE — Progress Notes (Signed)
Primary Care Physician: Harlan Stains, MD Primary Cardiologist: Dr Marlou Porch Primary Electrophysiologist: none Referring Physician: self-referred    Victor Barnes is a 66 y.o. male with a history of DM, CHF, DVT, HLD, HTN, OSA, atrial flutter, atrial fibrillation who presents for consultation in the [facility].  The patient was initially diagnosed with atrial fibrillation remotely and has been in permanent afib for several years. Patient is on Xarelto for a CHADS2VASC score of 5. Over the past 3 weeks he has been having URI symptoms and has been on a Z-pack and prednisone. He is starting another antibiotic today (augmentin). He was at his PCP earlier today and his heart rate was elevated ~ 150 bpm.   Today, he denies symptoms of palpitations, chest pain, orthopnea, PND, dizziness, presyncope, syncope, snoring, daytime somnolence, bleeding, or neurologic sequela. The patient is tolerating medications without difficulties and is otherwise without complaint today.    Atrial Fibrillation Risk Factors:  he does have symptoms or diagnosis of sleep apnea. he is compliant with CPAP therapy. he does not have a history of rheumatic fever.   he has a BMI of Body mass index is 50.91 kg/m.Marland Kitchen Filed Weights   10/06/21 1603  Weight: (!) 170.3 kg    Family History  Problem Relation Age of Onset   Hypertension Mother    Diabetes Mellitus I Father    Hypertension Paternal Grandmother    Heart attack Paternal Grandmother      Atrial Fibrillation Management history:  Previous antiarrhythmic drugs: sotalol  Previous cardioversions: none Previous ablations: none CHADS2VASC score: 5 Anticoagulation history: Xarelto   Past Medical History:  Diagnosis Date   Atrial fibrillation or flutter    Should be on Coumadin but h/o non-compliance; started Sotalol 08/2009   Cellulitis    Diabetes mellitus    DVT (deep venous thrombosis) (HCC)    Recurrent RLE DVT,  supposed to be on Coumadin but h/o non-compliance    Hyperlipidemia    Hypertension    Myocardiopathy ([place])    OSA on CPAP    Pulmonary embolism (HCC)    Sleep apnea    Venous stasis    Past Surgical History:  Procedure Laterality Date   LEG SURGERY      Current Outpatient Medications  Medication Sig Dispense Refill   amoxicillin-clavulanate (AUGMENTIN) 875-125 MG tablet Take 1 tablet by mouth 2 (two) times daily.     furosemide (LASIX) 40 MG tablet Take 40 mg by mouth daily as needed.     insulin regular (NOVOLIN R,HUMULIN R) 100 units/mL injection Inject into the skin daily. Patient reports injecting 80 units in the morning, 40 units at lunch time and 80 units at night time.     liraglutide (VICTOZA) 18 MG/3ML SOPN Inject 1.2 mg into the skin daily.     lisinopril (PRINIVIL,ZESTRIL) 5 MG tablet Take 5 mg by mouth daily.  1   metoprolol succinate (TOPROL-XL) 50 MG 24 hr tablet Take 50 mg by mouth 2 (two) times daily.  1   XARELTO 20 MG TABS tablet Take 20 mg by mouth daily.  0   ciclopirox (LOPROX) 0.77 % cream Apply topically 2 (two) times daily.     No current facility-administered medications for this encounter.    Allergies  Allergen Reactions   Crestor [Rosuvastatin] Other (See Comments)    myalgias   Lipitor [Atorvastatin] Other (See Comments)    myalgias   Pravastatin Other (See Comments)    myalgias  Social History   Socioeconomic History   Marital status: Married    Spouse name: Not on file   Number of children: Not on file   Years of education: Not on file   Highest education level: Not on file  Occupational History   Not on file  Tobacco Use   Smoking status: Former    Types: Cigarettes    Quit date: 03/23/1987    Years since quitting: 34.5   Smokeless tobacco: Never  Substance and Sexual Activity   Alcohol use: Yes    Comment: occ   Drug use: No   Sexual activity: Not on file  Other Topics Concern   Not on file  Social History Narrative   Not  on file   Social Determinants of Health   Financial Resource Strain: Not on file  Food Insecurity: Not on file  Transportation Needs: Not on file  Physical Activity: Not on file  Stress: Not on file  Social Connections: Not on file  Intimate Partner Violence: Not on file     ROS- All systems are reviewed and negative except as per the HPI above.  Physical Exam: Vitals:   10/06/21 1603  BP: (!) 166/86  Pulse: (!) 111  Weight: (!) 170.3 kg  Height: 6' (1.829 m)    GEN- The patient is a well appearing obese male, alert and oriented x 3 today.   Head- normocephalic, atraumatic Eyes-  Sclera clear, conjunctiva pink Ears- hearing intact Oropharynx- clear Neck- supple  Lungs- fine crackles RLL, normal work of breathing Heart- irregular rate and rhythm, no murmurs, rubs or gallops  GI- soft, NT, ND, + BS Extremities- no clubbing, cyanosis, 1-2 + edema MS- no significant deformity or atrophy Skin- no rash or lesion Psych- euthymic mood, full affect Neuro- strength and sensation are intact  Wt Readings from Last 3 Encounters:  10/06/21 (!) 170.3 kg  06/04/19 (!) 147.4 kg  06/20/18 (!) 159.9 kg    EKG today demonstrates  Afib with RVR, RBBB Vent. rate 111 BPM PR interval * ms QRS duration 160 ms QT/QTcB 386/524 ms  Echo 12/12/12 demonstrated  Procedure narrative: Transthoracic echocardiography. Image    quality was suboptimal. The study was technically    difficult, as a result of body habitus.  - Left ventricle: The cavity size was normal. Wall thickness    was increased in a pattern of mild LVH. There was    hypertrophy, with an appearance suggesting concentric    remodeling (increased wall thickness with normal wall    mass). Systolic function was mildly to moderately reduced.    The estimated ejection fraction was in the range of 40% to    45%. Diffuse hypokinesis. Regional wall motion    abnormalities cannot be excluded.  - Left atrium: The atrium was mildly  dilated.  - Right ventricle: Not able to assess RV function due to    poor visualization. Not able to assess RV size due to poor    visualization.  Impressions:   - An atrial fibrillation rhythm was noted on this study,    making this study technically difficult for the assessment    of cardiac function.   Epic records are reviewed at length today  CHA2DS2-VASc Score = 5  The patient's score is based upon: CHF History: 1 HTN History: 1 Diabetes History: 1 Stroke History: 0 Vascular Disease History: 1 Age Score: 1 Gender Score: 0       ASSESSMENT AND PLAN: 1. Permanent Atrial  Fibrillation/atrial flutter The patient's CHA2DS2-VASc score is 5, indicating a 7.2% annual risk of stroke.   Suspect recent elevated heart rates are 2/2 infection. Increase Toprol to 50 mg BID, this will likely be temporary.  Continue Xarelto 20 mg daily  2. Secondary Hypercoagulable State (ICD10:  D68.69) The patient is at significant risk for stroke/thromboembolism based upon his CHA2DS2-VASc Score of 5.  Continue Rivaroxaban (Xarelto).   3. Obesity Body mass index is 50.91 kg/m. Lifestyle modification was discussed at length including regular exercise and weight reduction.  4. Chronic systolic dysfunction  EF [id number] Patient has not been taking Lasix Will have him take 40 mg daily x 3 days Hopefully this will improve with rate control.  5. Cough Crackles noted on exam Check CXR today.   Follow up in the AF clinic next week.    [facility] [address] [place], [place] [address] [phone] 10/06/2021 4:52 PM

## 2021-10-13 ENCOUNTER — Ambulatory Visit (HOSPITAL_COMMUNITY)
Admission: RE | Admit: 2021-10-13 | Discharge: 2021-10-13 | Disposition: A | Discharge status: Home / Self Care | Payer: Medicare Other | Source: Ambulatory Visit | Attending: Physician Assistant | Admitting: Physician Assistant

## 2021-10-13 ENCOUNTER — Other Ambulatory Visit: Payer: Self-pay

## 2021-10-13 ENCOUNTER — Encounter (HOSPITAL_COMMUNITY): Payer: Self-pay

## 2021-10-13 ENCOUNTER — Encounter (HOSPITAL_COMMUNITY): Payer: Self-pay | Admitting: Physician Assistant

## 2021-10-13 VITALS — BP 148/88 | HR 88 | Ht 72.0 in | Wt 370.2 lb

## 2021-10-13 DIAGNOSIS — E669 Obesity, unspecified: Secondary | ICD-10-CM | POA: Diagnosis not present

## 2021-10-13 DIAGNOSIS — E785 Hyperlipidemia, unspecified: Secondary | ICD-10-CM | POA: Diagnosis not present

## 2021-10-13 DIAGNOSIS — G4733 Obstructive sleep apnea (adult) (pediatric): Secondary | ICD-10-CM | POA: Insufficient documentation

## 2021-10-13 DIAGNOSIS — I4892 Unspecified atrial flutter: Secondary | ICD-10-CM | POA: Diagnosis not present

## 2021-10-13 DIAGNOSIS — I5022 Chronic systolic (congestive) heart failure: Secondary | ICD-10-CM | POA: Insufficient documentation

## 2021-10-13 DIAGNOSIS — Z6841 Body Mass Index (BMI) 40.0 and over, adult: Secondary | ICD-10-CM | POA: Insufficient documentation

## 2021-10-13 DIAGNOSIS — I110 Hypertensive heart disease with heart failure: Secondary | ICD-10-CM | POA: Insufficient documentation

## 2021-10-13 DIAGNOSIS — I4821 Permanent atrial fibrillation: Secondary | ICD-10-CM | POA: Diagnosis not present

## 2021-10-13 DIAGNOSIS — E119 Type 2 diabetes mellitus without complications: Secondary | ICD-10-CM | POA: Diagnosis not present

## 2021-10-13 DIAGNOSIS — Z7901 Long term (current) use of anticoagulants: Secondary | ICD-10-CM | POA: Insufficient documentation

## 2021-10-13 DIAGNOSIS — D6869 Other thrombophilia: Secondary | ICD-10-CM | POA: Insufficient documentation

## 2021-10-13 MED ORDER — METOPROLOL SUCCINATE ER 50 MG PO TB24: 50.0000 mg | ORAL_TABLET | Freq: Two times a day (BID) | ORAL | 3 refills | Status: DC

## 2021-10-13 NOTE — Progress Notes (Signed)
Primary Care Physician: Harlan Stains, MD Primary Cardiologist: Dr Marlou Porch Primary Electrophysiologist: none Referring Physician: self-referred    Victor Barnes is a 66 y.o. male with a history of DM, CHF, DVT, HLD, HTN, OSA, atrial flutter, atrial fibrillation who presents for follow up in the [facility].  The patient was initially diagnosed with atrial fibrillation remotely and has been in permanent afib for several years. Patient is on Xarelto for a CHADS2VASC score of 5. Over the past several weeks he has been having URI symptoms and has been on a Z-pack, Augmentin, and prednisone. He was at his PCP and his heart rate was elevated ~ 150 bpm. At his last visit his metoprolol was increased and lasix resumed.   On follow up today, patient reports that he is feeling much better. His URI has mostly resolved. His weight is down 5-6 lbs and his heart rate is better controlled. No bleeding issues on anticoagulation. He is tolerating the medication without difficulty.   Today, he denies symptoms of palpitations, chest pain, orthopnea, PND, dizziness, presyncope, syncope, snoring, daytime somnolence, bleeding, or neurologic sequela. The patient is tolerating medications without difficulties and is otherwise without complaint today.    Atrial Fibrillation Risk Factors:  he does have symptoms or diagnosis of sleep apnea. he is compliant with CPAP therapy. he does not have a history of rheumatic fever.   he has a BMI of Body mass index is 50.21 kg/m.Marland Kitchen Filed Weights   10/13/21 1446  Weight: (!) 167.9 kg     Family History  Problem Relation Age of Onset   Hypertension Mother    Diabetes Mellitus I Father    Hypertension Paternal Grandmother    Heart attack Paternal Grandmother      Atrial Fibrillation Management history:  Previous antiarrhythmic drugs: sotalol  Previous cardioversions: none Previous ablations: none CHADS2VASC score:  5 Anticoagulation history: Xarelto   Past Medical History:  Diagnosis Date   Atrial fibrillation or flutter    Should be on Coumadin but h/o non-compliance; started Sotalol 08/2009   Cellulitis    Diabetes mellitus    DVT (deep venous thrombosis) (HCC)    Recurrent RLE DVT, supposed to be on Coumadin but h/o non-compliance    Hyperlipidemia    Hypertension    Myocardiopathy ([place])    OSA on CPAP    Pulmonary embolism (HCC)    Sleep apnea    Venous stasis    Past Surgical History:  Procedure Laterality Date   LEG SURGERY      Current Outpatient Medications  Medication Sig Dispense Refill   amoxicillin-clavulanate (AUGMENTIN) 875-125 MG tablet Take 1 tablet by mouth 2 (two) times daily.     ciclopirox (LOPROX) 0.77 % cream Apply topically 2 (two) times daily.     furosemide (LASIX) 40 MG tablet Take 40 mg by mouth daily as needed.     insulin regular (NOVOLIN R,HUMULIN R) 100 units/mL injection Inject into the skin daily. Patient reports injecting 80 units in the morning, 40 units at lunch time and 80 units at night time.     liraglutide (VICTOZA) 18 MG/3ML SOPN Inject 1.2 mg into the skin daily.     lisinopril (PRINIVIL,ZESTRIL) 5 MG tablet Take 5 mg by mouth daily.  1   metoprolol succinate (TOPROL-XL) 50 MG 24 hr tablet Take 50 mg by mouth 2 (two) times daily.  1   XARELTO 20 MG TABS tablet Take 20 mg by mouth daily.  0  No current facility-administered medications for this encounter.    Allergies  Allergen Reactions   Colesevelam     Other reaction(s): Other (See Comments)   Crestor [Rosuvastatin] Other (See Comments)    myalgias   Empagliflozin     Other reaction(s): Other (See Comments), yeast infections   Lipitor [Atorvastatin] Other (See Comments)    myalgias   Metformin Hcl Er     Other reaction(s): diarrhea   Pravastatin Other (See Comments)    myalgias    Social History   Socioeconomic History   Marital status: Married    Spouse name: Not on file    Number of children: Not on file   Years of education: Not on file   Highest education level: Not on file  Occupational History   Not on file  Tobacco Use   Smoking status: Former    Types: Cigarettes    Quit date: 03/23/1987    Years since quitting: 34.5   Smokeless tobacco: Never   Tobacco comments:    Former smoker 10/13/21  Substance and Sexual Activity   Alcohol use: Yes    Comment: occ   Drug use: No   Sexual activity: Not on file  Other Topics Concern   Not on file  Social History Narrative   Not on file   Social Determinants of Health   Financial Resource Strain: Not on file  Food Insecurity: Not on file  Transportation Needs: Not on file  Physical Activity: Not on file  Stress: Not on file  Social Connections: Not on file  Intimate Partner Violence: Not on file     ROS- All systems are reviewed and negative except as per the HPI above.  Physical Exam: Vitals:   10/13/21 1446  BP: (!) 148/88  Pulse: 88  Weight: (!) 167.9 kg  Height: 6' (1.829 m)    GEN- The patient is a well appearing obese male, alert and oriented x 3 today.   HEENT-head normocephalic, atraumatic, sclera clear, conjunctiva pink, hearing intact, trachea midline. Lungs- Clear to ausculation bilaterally, normal work of breathing Heart- irregular rate and rhythm, no murmurs, rubs or gallops  GI- soft, NT, ND, + BS Extremities- no clubbing, cyanosis, 1-2+ bilateral edema MS- no significant deformity or atrophy Skin- no rash or lesion Psych- euthymic mood, full affect Neuro- strength and sensation are intact   Wt Readings from Last 3 Encounters:  10/13/21 (!) 167.9 kg  10/06/21 (!) 170.3 kg  06/04/19 (!) 147.4 kg    EKG today demonstrates  Afib, RBBB Vent. rate 88 BPM PR interval * ms QRS duration 158 ms QT/QTcB 408/493 ms  Echo 12/12/12 demonstrated  Procedure narrative: Transthoracic echocardiography. Image    quality was suboptimal. The study was technically    difficult, as  a result of body habitus.  - Left ventricle: The cavity size was normal. Wall thickness    was increased in a pattern of mild LVH. There was    hypertrophy, with an appearance suggesting concentric    remodeling (increased wall thickness with normal wall    mass). Systolic function was mildly to moderately reduced.    The estimated ejection fraction was in the range of 40% to    45%. Diffuse hypokinesis. Regional wall motion    abnormalities cannot be excluded.  - Left atrium: The atrium was mildly dilated.  - Right ventricle: Not able to assess RV function due to    poor visualization. Not able to assess RV size due to poor  visualization.  Impressions:   - An atrial fibrillation rhythm was noted on this study,    making this study technically difficult for the assessment    of cardiac function.   Epic records are reviewed at length today  CHA2DS2-VASc Score = 5  The patient's score is based upon: CHF History: 1 HTN History: 1 Diabetes History: 1 Stroke History: 0 Vascular Disease History: 1 Age Score: 1 Gender Score: 0       ASSESSMENT AND PLAN: 1. Permanent Atrial Fibrillation/atrial flutter The patient's CHA2DS2-VASc score is 5, indicating a 7.2% annual risk of stroke.   Suspect recent elevated heart rates are 2/2 infection. Patient doing well on higher dose of BB. Continue Toprol 50 mg BID Continue Xarelto 20 mg daily Check echocardiogram  2. Secondary Hypercoagulable State (ICD10:  D68.69) The patient is at significant risk for stroke/thromboembolism based upon his CHA2DS2-VASc Score of 5.  Continue Rivaroxaban (Xarelto).   3. Obesity Body mass index is 50.21 kg/m. Lifestyle modification was discussed and encouraged including regular physical activity and weight reduction.  4. Chronic systolic dysfunction  EF [id number] Improved, his weight is down 5-6 lbs.   Follow up with Dr Marlou Porch in 3 months. AF clinic as needed.    [facility] [address] [place], [place] [address] [phone] 10/13/2021 2:50 PM

## 2021-10-27 ENCOUNTER — Telehealth: Payer: Self-pay | Admitting: Cardiology

## 2021-10-27 NOTE — Telephone Encounter (Signed)
?*  STAT* If patient is at the pharmacy, call can be transferred to refill team. ? ? ?1. Which medications need to be refilled? (please list name of each medication and dose if known) lisinopril (PRINIVIL,ZESTRIL) 5 MG tablet ? ?2. Which pharmacy/location (including street and city if local pharmacy) is medication to be sent to? [facility], [place] ? ?3. Do they need a 30 day or 90 day supply? 30 day ? ?Patient has an appointment 02/18/2022. ?

## 2021-10-28 ENCOUNTER — Other Ambulatory Visit: Payer: Self-pay | Admitting: *Deleted

## 2021-10-28 MED ORDER — LISINOPRIL 5 MG PO TABS: 5.0000 mg | ORAL_TABLET | Freq: Every day | ORAL | 11 refills | Status: DC

## 2021-10-28 NOTE — Telephone Encounter (Signed)
Refilled lisinopril per med list # 30 to requested pharmacy.  ?

## 2021-11-11 ENCOUNTER — Ambulatory Visit (HOSPITAL_COMMUNITY): Admission: RE | Admit: 2021-11-11 | Payer: Medicare Other | Source: Ambulatory Visit

## 2021-12-10 DIAGNOSIS — E1122 Type 2 diabetes mellitus with diabetic chronic kidney disease: Secondary | ICD-10-CM | POA: Diagnosis not present

## 2021-12-10 DIAGNOSIS — E785 Hyperlipidemia, unspecified: Secondary | ICD-10-CM | POA: Diagnosis not present

## 2021-12-10 DIAGNOSIS — R600 Localized edema: Secondary | ICD-10-CM | POA: Diagnosis not present

## 2021-12-10 DIAGNOSIS — I10 Essential (primary) hypertension: Secondary | ICD-10-CM | POA: Diagnosis not present

## 2021-12-14 DIAGNOSIS — M5136 Other intervertebral disc degeneration, lumbar region: Secondary | ICD-10-CM | POA: Diagnosis not present

## 2021-12-14 DIAGNOSIS — M5416 Radiculopathy, lumbar region: Secondary | ICD-10-CM | POA: Diagnosis not present

## 2022-01-28 DIAGNOSIS — M5416 Radiculopathy, lumbar region: Secondary | ICD-10-CM | POA: Diagnosis not present

## 2022-02-05 ENCOUNTER — Other Ambulatory Visit: Payer: Self-pay | Admitting: *Deleted

## 2022-02-05 DIAGNOSIS — I83008 Varicose veins of unspecified lower extremity with ulcer other part of lower leg: Secondary | ICD-10-CM

## 2022-02-18 ENCOUNTER — Encounter: Payer: Self-pay | Admitting: Cardiology

## 2022-02-18 ENCOUNTER — Ambulatory Visit: Payer: Medicare Other | Admitting: Cardiology

## 2022-02-18 VITALS — BP 162/84 | HR 76 | Ht 72.0 in | Wt 374.8 lb

## 2022-02-18 DIAGNOSIS — I10 Essential (primary) hypertension: Secondary | ICD-10-CM

## 2022-02-18 DIAGNOSIS — D6869 Other thrombophilia: Secondary | ICD-10-CM

## 2022-02-18 DIAGNOSIS — Z79899 Other long term (current) drug therapy: Secondary | ICD-10-CM | POA: Diagnosis not present

## 2022-02-18 DIAGNOSIS — I4821 Permanent atrial fibrillation: Secondary | ICD-10-CM

## 2022-02-18 MED ORDER — IRBESARTAN 150 MG PO TABS: 150.0000 mg | ORAL_TABLET | Freq: Every day | ORAL | 3 refills | Status: DC

## 2022-02-18 NOTE — Assessment & Plan Note (Signed)
Continue to encourage weight loss, activity.

## 2022-02-18 NOTE — Assessment & Plan Note (Signed)
I will go ahead and stop his lisinopril 5 mg a day and start him on irbesartan 150 mg daily for prescription drug management.  Blood pressure still elevated.  At home he states that it is usually in the 150 range.  Weight loss will help as well.  Continue with metoprolol succinate 50 mg twice a day.  Rarely takes Lasix.

## 2022-02-18 NOTE — Assessment & Plan Note (Signed)
Overall well rate controlled.  Several risk factors would preclude rhythm control.  Previous right bundle branch block noted.  Prior EF in the 40% range.  I am going to recheck echocardiogram since it has been approximately 9 years.  He has also been noting some fatigue when cutting the grass.

## 2022-02-18 NOTE — Progress Notes (Signed)
Cardiology Office Note:    Date:  02/18/2022   ID:  Victor Barnes, DOB [birth year], MRN [id number]  PCP:  Laurann Montana, MD  Cardiologist:  Donato Schultz, MD  Electrophysiologist:  None   Referring MD: Laurann Montana, MD     History of Present Illness:    Victor Barnes is a 66 y.o. male was previously here for the evaluation of atrial fibrillation/flutter with prior use of sotalol started in January 2011 with noted noncompliance with Coumadin currently listed with Xarelto, and is now here for follow up. Has a history of recurrent right lower extremity DVT, hypertension hyperlipidemia obstructive sleep apnea, PE.  Also has diabetes.  Reviewed Dr. Lucilla Lame note.  It has been over 3 years since his last visit.  He had a prior history of atrial flutter that was rate controlled, asymptomatic.  Prior large abscess right medial calf that required extensive operative debridement.  He has not followed up in the past, nor with Dr. Sharl Ma.  Previously stopped Jardiance because of yeast infection.  LDL 122 triglycerides 146 HDL 29 hemoglobin 14.7.  Previously severe obstructive sleep apnea.  Overall he is doing quite well.  No issues with regards to his atrial fibrillation/heart.  He has developing some symptoms of peripheral neuropathy likely from diabetes.  Arterial ABIs were normal previously.  We discussed this.  Victor Balsam, NP is his stepdaughter.   Today, he states that he has been feeling well, but has been experiencing fatigue. He has to take breaks when doing activities like mowing the lawn. At home his blood pressure is normally around 150/70 mmHg.   The Lisinopril has improved in keeping his blood pressure low. He also remains compliant with Metoprolol and Lasix, which he takes the Lasix a couple of times a week instead of daily.   Amber's mother had a liver transplant recently, and was able to recover without any complications.  The patient denies chest pain, nocturnal  dyspnea, orthopnea or peripheral edema. There have been no palpitations, lightheadedness or syncope. Complains of having low energy.   Past Medical History:  Diagnosis Date   Atrial fibrillation or flutter    Should be on Coumadin but h/o non-compliance; started Sotalol 08/2009   Cellulitis    Diabetes mellitus    DVT (deep venous thrombosis) (HCC)    Recurrent RLE DVT, supposed to be on Coumadin but h/o non-compliance    Hyperlipidemia    Hypertension    Myocardiopathy (HCC)    OSA on CPAP    Pulmonary embolism (HCC)    Sleep apnea    Venous stasis     Past Surgical History:  Procedure Laterality Date   LEG SURGERY      Current Medications: Current Meds  Medication Sig   furosemide (LASIX) 40 MG tablet Take 40 mg by mouth daily as needed.   insulin regular (NOVOLIN R,HUMULIN R) 100 units/mL injection Inject into the skin daily. Patient reports injecting 80 units in the morning, 40 units at lunch time and 80 units at night time.   irbesartan (AVAPRO) 150 MG tablet Take 1 tablet (150 mg total) by mouth daily.   liraglutide (VICTOZA) 18 MG/3ML SOPN Inject 1.2 mg into the skin daily.   metoprolol succinate (TOPROL-XL) 50 MG 24 hr tablet Take 1 tablet (50 mg total) by mouth 2 (two) times daily. (Patient taking differently: Take 100 mg by mouth daily.)   XARELTO 20 MG TABS tablet Take 20 mg by mouth daily.   [DISCONTINUED] lisinopril (ZESTRIL)  5 MG tablet Take 1 tablet (5 mg total) by mouth daily. (Patient taking differently: Take 10 mg by mouth daily.)     Allergies:   Colesevelam, Crestor [rosuvastatin], Empagliflozin, Lipitor [atorvastatin], Metformin hcl er, and Pravastatin   Social History   Socioeconomic History   Marital status: Married    Spouse name: Not on file   Number of children: Not on file   Years of education: Not on file   Highest education level: Not on file  Occupational History   Not on file  Tobacco Use   Smoking status: Former    Types: Cigarettes     Quit date: 03/23/1987    Years since quitting: 34.9   Smokeless tobacco: Never   Tobacco comments:    Former smoker 10/13/21  Substance and Sexual Activity   Alcohol use: Yes    Comment: occ   Drug use: No   Sexual activity: Not on file  Other Topics Concern   Not on file  Social History Narrative   Not on file   Social Determinants of Health   Financial Resource Strain: Not on file  Food Insecurity: Not on file  Transportation Needs: Not on file  Physical Activity: Not on file  Stress: Not on file  Social Connections: Not on file     Family History: The patient's family history includes Diabetes Mellitus I in his father; Heart attack in his paternal grandmother; Hypertension in his mother and paternal grandmother.  ROS:   Please see the history of present illness. (+) Low energy/fatigue All other systems are reviewed and negative.    EKGs/Labs/Other Studies Reviewed:    The following studies were reviewed today: Prior office notes lab work EKG  Lower extremity arterial Dopplers 10/18/2017: Resting ABI the bilateral lower extremities within normal limits, with the segmental exam demonstrating no evidence of significant arterial occlusive disease.  Echocardiogram 12/12/2012 -EF 40 to 45%, challenging images, mild LVH  CT Angio Chest Pulmonary Embolism 12/11/2012: IMPRESSION:  1.  Positive for bilateral lower lobe pulmonary emboli.  2.  Trace right pleural effusion.  Mild pulmonary atelectasis.  3.  Stable 1 cm calcified left thyroid nodule since 2012.   Critical Value/emergent results were called by telephone at the  time of interpretation on 12/11/2012 at 0728 hours to ED PA  Pisciotta, who verbally acknowledged these results.   EKG:  EKG is personally reviewed.    02/18/2022 EKG: EKG was not ordered.   06/20/2018 EKG: atrial fibrillation right bundle branch block heart rate 94 bpm QRS duration 150 ms.  Prior EKG 12/21/12 shows right bundle branch block with heart  rate of 146, likely atrial flutter underlying  Recent Labs:  No results found for requested labs within last 365 days.  Recent Lipid Panel No results found for: "CHOL", "TRIG", "HDL", "CHOLHDL", "VLDL", "LDLCALC", "LDLDIRECT"  Physical Exam:    VS:  BP (!) 162/84   Pulse 76   Ht 6' (1.829 m)   Wt (!) 374 lb 12.8 oz (170 kg)   SpO2 95%   BMI 50.83 kg/m     Wt Readings from Last 3 Encounters:  02/18/22 (!) 374 lb 12.8 oz (170 kg)  10/13/21 (!) 370 lb 3.2 oz (167.9 kg)  10/06/21 (!) 375 lb 6.4 oz (170.3 kg)     GEN: Morbidly obese well nourished, well developed in no acute distress HEENT: Normal NECK: No JVD; No carotid bruits LYMPHATICS: No lymphadenopathy CARDIAC: Irreg irreg normal rate, no murmurs, rubs, gallops RESPIRATORY:  Clear to auscultation without rales, wheezing or rhonchi  ABDOMEN: Soft, non-tender, non-distended MUSCULOSKELETAL:  No edema; No deformity  SKIN: Warm and dry, wearing compression hose NEUROLOGIC:  Alert and oriented x 3 PSYCHIATRIC:  Normal affect   ASSESSMENT:    1. Permanent atrial fibrillation (HCC)   2. Essential hypertension   3. Medication management   4. Secondary hypercoagulable state (HCC)   5. Morbid obesity (HCC)     PLAN:    In order of problems listed above:  Permanent atrial fibrillation (HCC) Overall well rate controlled.  Several risk factors would preclude rhythm control.  Previous right bundle branch block noted.  Prior EF in the 40% range.  I am going to recheck echocardiogram since it has been approximately 9 years.  He has also been noting some fatigue when cutting the grass.  Secondary hypercoagulable state (HCC) Continue with Xarelto.  We will go ahead and check lab work today to make sure that he does not have any signs of anemia.  High risk medication management.  Previous hemoglobin 13.9, creatinine 0.8, TSH 1.4, hemoglobin A1c 7.2.  No signs of bleeding.  Morbid obesity Continue to encourage weight loss,  activity.  Primary hypertension I will go ahead and stop his lisinopril 5 mg a day and start him on irbesartan 150 mg daily for prescription drug management.  Blood pressure still elevated.  At home he states that it is usually in the 150 range.  Weight loss will help as well.  Continue with metoprolol succinate 50 mg twice a day.  Rarely takes Lasix.    6 month follow-up  Medication Adjustments/Labs and Tests Ordered: Current medicines are reviewed at length with the patient today.  Concerns regarding medicines are outlined above.  Orders Placed This Encounter  Procedures   CBC   Comprehensive metabolic panel   ECHOCARDIOGRAM COMPLETE   Meds ordered this encounter  Medications   irbesartan (AVAPRO) 150 MG tablet    Sig: Take 1 tablet (150 mg total) by mouth daily.    Dispense:  90 tablet    Refill:  3    Patient Instructions  Medication Instructions:  Please discontinue your Lisinopril and start Irbesartan 150 mg once a day. Continue all other medications as listed.  *If you need a refill on your cardiac medications before your next appointment, please call your pharmacy*  Lab Work: Please have blood work today (CBC, CMP)  If you have labs (blood work) drawn today and your tests are completely normal, you will receive your results only by: MyChart Message (if you have MyChart) OR A paper copy in the mail If you have any lab test that is abnormal or we need to change your treatment, we will call you to review the results.  Testing/Procedures: Your physician has requested that you have an echocardiogram. Echocardiography is a painless test that uses sound waves to create images of your heart. It provides your doctor with information about the size and shape of your heart and how well your heart's chambers and valves are working. This procedure takes approximately one hour. There are no restrictions for this procedure.  Follow-Up: At [facility], you and your health needs  are our priority.  As part of our continuing mission to provide you with exceptional heart care, we have created designated Provider Care Teams.  These Care Teams include your primary Cardiologist (physician) and Advanced Practice Providers (APPs -  Physician Assistants and Nurse Practitioners) who all work together to provide you with  the care you need, when you need it.  We recommend signing up for the patient portal called "MyChart".  Sign up information is provided on this After Visit Summary.  MyChart is used to connect with patients for Virtual Visits (Telemedicine).  Patients are able to view lab/test results, encounter notes, upcoming appointments, etc.  Non-urgent messages can be sent to your provider as well.   To learn more about what you can do with MyChart, go to [email or web address].    Your next appointment:   6 month(s)  The format for your next appointment:   In Person  Provider:   Donato Schultz, MD {   Important Information About Sugar          I,Tinashe Williams,acting as a scribe for Donato Schultz, MD.,have documented all relevant documentation on the behalf of Donato Schultz, MD,as directed by  Donato Schultz, MD while in the presence of Donato Schultz, MD.   I, Donato Schultz, MD, have reviewed all documentation for this visit. The documentation on 02/18/22 for the exam, diagnosis, procedures, and orders are all accurate and complete.

## 2022-02-18 NOTE — Assessment & Plan Note (Signed)
Continue with Xarelto.  We will go ahead and check lab work today to make sure that he does not have any signs of anemia.  High risk medication management.  Previous hemoglobin 13.9, creatinine 0.8, TSH 1.4, hemoglobin A1c 7.2.  No signs of bleeding.

## 2022-02-18 NOTE — Patient Instructions (Signed)
Medication Instructions:  Please discontinue your Lisinopril and start Irbesartan 150 mg once a day. Continue all other medications as listed.  *If you need a refill on your cardiac medications before your next appointment, please call your pharmacy*  Lab Work: Please have blood work today (CBC, CMP)  If you have labs (blood work) drawn today and your tests are completely normal, you will receive your results only by: MyChart Message (if you have MyChart) OR A paper copy in the mail If you have any lab test that is abnormal or we need to change your treatment, we will call you to review the results.  Testing/Procedures: Your physician has requested that you have an echocardiogram. Echocardiography is a painless test that uses sound waves to create images of your heart. It provides your doctor with information about the size and shape of your heart and how well your heart's chambers and valves are working. This procedure takes approximately one hour. There are no restrictions for this procedure.  Follow-Up: At [facility], you and your health needs are our priority.  As part of our continuing mission to provide you with exceptional heart care, we have created designated Provider Care Teams.  These Care Teams include your primary Cardiologist (physician) and Advanced Practice Providers (APPs -  Physician Assistants and Nurse Practitioners) who all work together to provide you with the care you need, when you need it.  We recommend signing up for the patient portal called "MyChart".  Sign up information is provided on this After Visit Summary.  MyChart is used to connect with patients for Virtual Visits (Telemedicine).  Patients are able to view lab/test results, encounter notes, upcoming appointments, etc.  Non-urgent messages can be sent to your provider as well.   To learn more about what you can do with MyChart, go to [email or web address].    Your next appointment:   6 month(s)  The  format for your next appointment:   In Person  Provider:   Donato Schultz, MD {   Important Information About Sugar

## 2022-02-22 ENCOUNTER — Ambulatory Visit (HOSPITAL_COMMUNITY)
Admission: RE | Admit: 2022-02-22 | Discharge: 2022-02-22 | Disposition: A | Discharge status: Home / Self Care | Payer: Medicare Other | Source: Ambulatory Visit | Attending: Surgery | Admitting: Surgery

## 2022-02-22 ENCOUNTER — Ambulatory Visit: Payer: Medicare Other | Admitting: Physician Assistant

## 2022-02-22 VITALS — BP 146/86 | HR 74 | Temp 98.1°F | Resp 20 | Ht 72.0 in | Wt 355.9 lb

## 2022-02-22 DIAGNOSIS — L97808 Non-pressure chronic ulcer of other part of unspecified lower leg with other specified severity: Secondary | ICD-10-CM | POA: Insufficient documentation

## 2022-02-22 DIAGNOSIS — I872 Venous insufficiency (chronic) (peripheral): Secondary | ICD-10-CM | POA: Diagnosis not present

## 2022-02-22 DIAGNOSIS — I83008 Varicose veins of unspecified lower extremity with ulcer other part of lower leg: Secondary | ICD-10-CM

## 2022-02-22 LAB — COMPREHENSIVE METABOLIC PANEL
ALT: 14 IU/L (ref 0–44)
AST: 14 IU/L (ref 0–40)
Albumin/Globulin Ratio: 1.3 (ref 1.2–2.2)
Albumin: 4 g/dL (ref 3.9–4.9)
Alkaline Phosphatase: 123 IU/L — ABNORMAL HIGH (ref 44–121)
BUN/Creatinine Ratio: 14 (ref 10–24)
BUN: 12 mg/dL (ref 8–27)
Bilirubin Total: 0.4 mg/dL (ref 0.0–1.2)
CO2: 25 mmol/L (ref 20–29)
Calcium: 9.5 mg/dL (ref 8.6–10.2)
Chloride: 102 mmol/L (ref 96–106)
Creatinine, Ser: 0.84 mg/dL (ref 0.76–1.27)
Globulin, Total: 3.1 g/dL (ref 1.5–4.5)
Glucose: 43 mg/dL — ABNORMAL LOW (ref 70–99)
Potassium: 4 mmol/L (ref 3.5–5.2)
Sodium: 142 mmol/L (ref 134–144)
Total Protein: 7.1 g/dL (ref 6.0–8.5)
eGFR: 96 mL/min/{1.73_m2} (ref >59)

## 2022-02-22 LAB — CBC
Hematocrit: 44.3 % (ref 37.5–51.0)
Hemoglobin: 14.4 g/dL (ref 13.0–17.7)
MCH: 26.4 pg — ABNORMAL LOW (ref 26.6–33.0)
MCHC: 32.5 g/dL (ref 31.5–35.7)
MCV: 81 fL (ref 79–97)
Platelets: 242 10*3/uL (ref 150–450)
RBC: 5.46 x10E6/uL (ref 4.14–5.80)
RDW: 15.1 % (ref 11.6–15.4)
WBC: 10.6 10*3/uL (ref 3.4–10.8)

## 2022-02-22 NOTE — Progress Notes (Signed)
Office Note     CC:  follow up Requesting Provider:  Laurann Montana, MD  HPI: Victor Barnes is a 66 y.o. ([birth year]) male who presents for evaluation of right lower extremity edema.  Patient states he has had significant swelling in his right more than left leg over the past 10 years since he was diagnosed with a DVT and PE.  He however has been bothered by his right leg edema more so than usual over the past several months.  He is wearing 20 to 30 mm knee-high compression stockings daily.  He is making an effort to elevate his legs periodically throughout the day.  He is also trying to avoid prolonged sitting and standing.  In addition to history of DVT he has also had venous ulcerations treated with Unna boots in the past.  He has also been hospitalized for cellulitis.  He denies tobacco use.  He is on Xarelto for atrial fibrillation.   Past Medical History:  Diagnosis Date   Atrial fibrillation or flutter    Should be on Coumadin but h/o non-compliance; started Sotalol 08/2009   Cellulitis    Diabetes mellitus    DVT (deep venous thrombosis) (HCC)    Recurrent RLE DVT, supposed to be on Coumadin but h/o non-compliance    Hyperlipidemia    Hypertension    Myocardiopathy (HCC)    OSA on CPAP    Pulmonary embolism (HCC)    Sleep apnea    Venous stasis     Past Surgical History:  Procedure Laterality Date   LEG SURGERY      Social History   Socioeconomic History   Marital status: Married    Spouse name: Not on file   Number of children: Not on file   Years of education: Not on file   Highest education level: Not on file  Occupational History   Not on file  Tobacco Use   Smoking status: Former    Types: Cigarettes    Quit date: 03/23/1987    Years since quitting: 34.9    Passive exposure: Never   Smokeless tobacco: Never   Tobacco comments:    Former smoker 10/13/21  Substance and Sexual Activity   Alcohol use: Yes    Comment: occ   Drug use: No   Sexual  activity: Not on file  Other Topics Concern   Not on file  Social History Narrative   Not on file   Social Determinants of Health   Financial Resource Strain: Not on file  Food Insecurity: Not on file  Transportation Needs: Not on file  Physical Activity: Not on file  Stress: Not on file  Social Connections: Not on file  Intimate Partner Violence: Not on file    Family History  Problem Relation Age of Onset   Hypertension Mother    Diabetes Mellitus I Father    Hypertension Paternal Grandmother    Heart attack Paternal Grandmother     Current Outpatient Medications  Medication Sig Dispense Refill   furosemide (LASIX) 40 MG tablet Take 40 mg by mouth daily as needed.     insulin regular (NOVOLIN R,HUMULIN R) 100 units/mL injection Inject into the skin daily. Patient reports injecting 80 units in the morning, 40 units at lunch time and 80 units at night time.     irbesartan (AVAPRO) 150 MG tablet Take 1 tablet (150 mg total) by mouth daily. 90 tablet 3   liraglutide (VICTOZA) 18 MG/3ML SOPN Inject 1.2 mg into the  skin daily.     metoprolol succinate (TOPROL-XL) 50 MG 24 hr tablet Take 1 tablet (50 mg total) by mouth 2 (two) times daily. (Patient taking differently: Take 100 mg by mouth daily.) 180 tablet 3   XARELTO 20 MG TABS tablet Take 20 mg by mouth daily.  0   No current facility-administered medications for this visit.    Allergies  Allergen Reactions   Colesevelam     Other reaction(s): Other (See Comments)   Crestor [Rosuvastatin] Other (See Comments)    myalgias   Empagliflozin     Other reaction(s): Other (See Comments), yeast infections   Lipitor [Atorvastatin] Other (See Comments)    myalgias   Metformin Hcl Er     Other reaction(s): diarrhea   Pravastatin Other (See Comments)    myalgias     REVIEW OF SYSTEMS:   [X]  denotes positive finding, [ ]  denotes negative finding Cardiac  Comments:  Chest pain or chest pressure:    Shortness of breath upon  exertion:    Short of breath when lying flat:    Irregular heart rhythm:        Vascular    Pain in calf, thigh, or hip brought on by ambulation:    Pain in feet at night that wakes you up from your sleep:     Blood clot in your veins:    Leg swelling:         Pulmonary    Oxygen at home:    Productive cough:     Wheezing:         Neurologic    Sudden weakness in arms or legs:     Sudden numbness in arms or legs:     Sudden onset of difficulty speaking or slurred speech:    Temporary loss of vision in one eye:     Problems with dizziness:         Gastrointestinal    Blood in stool:     Vomited blood:         Genitourinary    Burning when urinating:     Blood in urine:        Psychiatric    Major depression:         Hematologic    Bleeding problems:    Problems with blood clotting too easily:        Skin    Rashes or ulcers:        Constitutional    Fever or chills:      PHYSICAL EXAMINATION:  Vitals:   02/22/22 0828  BP: (!) 146/86  Pulse: 74  Resp: 20  Temp: 98.1 F (36.7 C)  TempSrc: Temporal  SpO2: 97%  Weight: (!) 355 lb 14.4 oz (161.4 kg)  Height: 6' (1.829 m)    General:  WDWN in NAD; vital signs documented above Gait: Not observed HENT: WNL, normocephalic Pulmonary: normal non-labored breathing , without Rales, rhonchi,  wheezing Cardiac: regular HR Abdomen: soft, NT, no masses Skin: without rashes Vascular Exam/Pulses:  Right Left  DP 2+ (normal) 2+ (normal)   Extremities: Right leg pitting edema to the level of the knee; stasis pigmentation changes of bilateral shins; no visible varicosities; abrasion of left medial lower leg but no ulceration Musculoskeletal: no muscle wasting or atrophy  Neurologic: A&O X 3;  No focal weakness or paresthesias are detected Psychiatric:  The pt has Normal affect.   Non-Invasive Vascular Imaging:    Right lower extremity venous reflux study Chronic  appearing thrombus of the right popliteal vein  and greater saphenous vein at the level of the calf Incompetent common femoral vein and popliteal vein Incompetent greater saphenous vein with diameter greater than 5 mm throughout   ASSESSMENT/PLAN:: 66 y.o. male here for evaluation of chronic venous insufficiency  -Patient has history of right lower extremity DVT and PE.  On right lower extremity venous reflux study he has chronic nonocclusive appearing thrombus in the popliteal vein as well as the greater saphenous vein in the calf.  Reflux study also demonstrated an incompetent greater saphenous vein with diameter greater than 5 mm.  Recommendations included continued use of 20 to 30 mmHg compression with additional compressive wrap to the level of the thigh to be used daily.  He will also continue to periodically elevate his legs above the level of his heart during the day.  He will avoid prolonged sitting and standing as much as possible.  He will follow-up in 3 months time with a vein surgeon to see if he would be a good candidate for laser ablation therapy.  He will require an Unna boot in the meantime if he is unable to heal the abrasion of his left medial calf.  Call/return office sooner with any questions or concerns.   Emilie Rutter, PA-C [facility] [phone]  Clinic MD:   Myra Gianotti

## 2022-03-10 ENCOUNTER — Ambulatory Visit (HOSPITAL_COMMUNITY): Payer: Medicare Other | Attending: Cardiology

## 2022-03-10 DIAGNOSIS — I4821 Permanent atrial fibrillation: Secondary | ICD-10-CM

## 2022-03-10 LAB — ECHOCARDIOGRAM COMPLETE: S' Lateral: 3.8 cm

## 2022-03-10 MED ORDER — PERFLUTREN LIPID MICROSPHERE
1.0000 mL | INTRAVENOUS | Status: AC | PRN
  Administered 2022-03-10: 1 mL via INTRAVENOUS

## 2022-03-11 ENCOUNTER — Other Ambulatory Visit: Payer: Self-pay | Admitting: *Deleted

## 2022-03-11 DIAGNOSIS — I7789 Other specified disorders of arteries and arterioles: Secondary | ICD-10-CM

## 2022-05-06 DIAGNOSIS — H52203 Unspecified astigmatism, bilateral: Secondary | ICD-10-CM | POA: Diagnosis not present

## 2022-05-06 DIAGNOSIS — D23111 Other benign neoplasm of skin of right upper eyelid, including canthus: Secondary | ICD-10-CM | POA: Diagnosis not present

## 2022-05-06 DIAGNOSIS — E119 Type 2 diabetes mellitus without complications: Secondary | ICD-10-CM | POA: Diagnosis not present

## 2022-05-06 DIAGNOSIS — D23112 Other benign neoplasm of skin of right lower eyelid, including canthus: Secondary | ICD-10-CM | POA: Diagnosis not present

## 2022-05-10 ENCOUNTER — Telehealth: Payer: Self-pay

## 2022-05-10 NOTE — Patient Outreach (Signed)
  Care Coordination   05/10/2022 Name: Victor Barnes MRN: [id number] DOB: 02/07/1956   Care Coordination Outreach Attempts:  An unsuccessful telephone outreach was attempted today to offer the patient information about available care coordination services as a benefit of their health plan.   Follow Up Plan:  Additional outreach attempts will be made to offer the patient care coordination information and services.   Encounter Outcome:  No Answer  Care Coordination Interventions Activated:  No   Care Coordination Interventions:  No, not indicated    Jone Baseman, RN, MSN [facility] Care Management Care Management Coordinator Direct Line [phone]

## 2022-05-17 ENCOUNTER — Encounter: Payer: Self-pay | Admitting: Cardiology

## 2022-05-17 DIAGNOSIS — R0609 Other forms of dyspnea: Secondary | ICD-10-CM | POA: Diagnosis not present

## 2022-05-17 DIAGNOSIS — R197 Diarrhea, unspecified: Secondary | ICD-10-CM | POA: Diagnosis not present

## 2022-05-17 DIAGNOSIS — R609 Edema, unspecified: Secondary | ICD-10-CM | POA: Diagnosis not present

## 2022-05-17 DIAGNOSIS — R109 Unspecified abdominal pain: Secondary | ICD-10-CM | POA: Diagnosis not present

## 2022-05-19 ENCOUNTER — Telehealth: Payer: Self-pay

## 2022-05-19 NOTE — Patient Instructions (Signed)
Visit Information  Thank you for taking time to visit with me today. Please don't hesitate to contact me if I can be of assistance to you.   Following are the goals we discussed today:   Goals Addressed             This Visit's Progress    COMPLETED: Care Coordnation Activities-No follow up required       Care Coordination Interventions: Evaluation of current treatment plan related to diabetes and patient's adherence to plan as established by provider Advised patient to schedule flu vaccine.  Discussed diabetes management.  Patient states doing well and declines further education and support          If you are experiencing a Mental Health or [place] or need someone to talk to, please call the Suicide and Crisis Lifeline: 988   Patient verbalizes understanding of instructions and care plan provided today and agrees to view in [place]. Active MyChart status and patient understanding of how to access instructions and care plan via MyChart confirmed with patient.     No further follow up required: patient declined  Jone Baseman, RN, MSN [place] [facility] Care Management Coordinator Direct Line [phone]

## 2022-05-19 NOTE — Patient Outreach (Signed)
  Care Coordination   Initial Visit Note   05/19/2022 Name: Victor Barnes MRN: [id number] DOB: 1955-12-06  Victor Barnes is a 66 y.o. year old male who sees Victor Stains, MD for primary care. I spoke with  Victor Barnes by phone today.  What matters to the patients health and wellness today?  none    Goals Addressed             This Visit's Progress    COMPLETED: Care Coordnation Activities-No follow up required       Care Coordination Interventions: Evaluation of current treatment plan related to diabetes and patient's adherence to plan as established by provider Advised patient to schedule flu vaccine.  Discussed diabetes management.  Patient states doing well and declines further education and support          SDOH assessments and interventions completed:  No     Care Coordination Interventions Activated:  Yes  Care Coordination Interventions:  Yes, provided   Follow up plan: No further intervention required.   Encounter Outcome:  Pt. Visit Completed    Victor Baseman, RN, MSN [place] [facility] Care Management Coordinator Direct Line [phone]

## 2022-05-26 ENCOUNTER — Ambulatory Visit: Payer: Medicare Other | Admitting: Vascular Surgery

## 2022-05-26 ENCOUNTER — Encounter: Payer: Self-pay | Admitting: Vascular Surgery

## 2022-05-26 VITALS — BP 170/79 | HR 73 | Temp 98.0°F | Resp 20 | Ht 72.0 in | Wt 379.0 lb

## 2022-05-26 DIAGNOSIS — I872 Venous insufficiency (chronic) (peripheral): Secondary | ICD-10-CM

## 2022-05-26 DIAGNOSIS — I890 Lymphedema, not elsewhere classified: Secondary | ICD-10-CM | POA: Diagnosis not present

## 2022-05-26 NOTE — Progress Notes (Addendum)
Patient ID: Victor Barnes, male   DOB: [birth year], 66 y.o.   MRN: [id number]  Reason for Consult: Follow-up   Referred by Laurann Montana, MD  Subjective:     HPI:  Victor Barnes is a 66 y.o. male has a history of venous ulceration of the right lower extremity 19 years ago.  He has persistent swelling of the right greater than left lower extremity with skin changes on the right.  He does wear compression stockings daily and has for many years but states that he gets very little improvement of the swelling at this time.  He states that he has attempted weight loss with exercise but does have pain in the right knee related to the swelling and also despite cutting calories is unable to lose weight.  He also elevates his legs regularly he is on anticoagulation for atrial fibrillation.     Past Medical History:  Diagnosis Date   Atrial fibrillation or flutter    Should be on Coumadin but h/o non-compliance; started Sotalol 08/2009   Cellulitis    Diabetes mellitus    DVT (deep venous thrombosis) (HCC)    Recurrent RLE DVT, supposed to be on Coumadin but h/o non-compliance    Hyperlipidemia    Hypertension    Myocardiopathy (HCC)    OSA on CPAP    Pulmonary embolism (HCC)    Sleep apnea    Venous stasis    Family History  Problem Relation Age of Onset   Hypertension Mother    Diabetes Mellitus I Father    Hypertension Paternal Grandmother    Heart attack Paternal Grandmother    Past Surgical History:  Procedure Laterality Date   LEG SURGERY      Short Social History:  Social History   Tobacco Use   Smoking status: Former    Types: Cigarettes    Quit date: 03/23/1987    Years since quitting: 35.2    Passive exposure: Never   Smokeless tobacco: Never   Tobacco comments:    Former smoker 10/13/21  Substance Use Topics   Alcohol use: Yes    Comment: occ    Allergies  Allergen Reactions   Colesevelam     Other reaction(s): Other (See Comments)   Crestor  [Rosuvastatin] Other (See Comments)    myalgias   Empagliflozin     Other reaction(s): Other (See Comments), yeast infections   Lipitor [Atorvastatin] Other (See Comments)    myalgias   Metformin Hcl Er     Other reaction(s): diarrhea   Pravastatin Other (See Comments)    myalgias    Current Outpatient Medications  Medication Sig Dispense Refill   furosemide (LASIX) 40 MG tablet Take 40 mg by mouth daily as needed.     insulin regular (NOVOLIN R,HUMULIN R) 100 units/mL injection Inject into the skin daily. Patient reports injecting 80 units in the morning, 40 units at lunch time and 80 units at night time.     irbesartan (AVAPRO) 150 MG tablet Take 1 tablet (150 mg total) by mouth daily. 90 tablet 3   liraglutide (VICTOZA) 18 MG/3ML SOPN Inject 1.2 mg into the skin daily.     metoprolol succinate (TOPROL-XL) 50 MG 24 hr tablet Take 1 tablet (50 mg total) by mouth 2 (two) times daily. (Patient taking differently: Take 100 mg by mouth daily.) 180 tablet 3   XARELTO 20 MG TABS tablet Take 20 mg by mouth daily.  0   No current facility-administered medications for  this visit.    Review of Systems  Constitutional:  Constitutional negative. HENT: HENT negative.  Eyes: Eyes negative.  Respiratory: Respiratory negative.  Cardiovascular: Positive for leg swelling.  GI: Gastrointestinal negative.  Skin:       Healed right medial ankle wound Neurological: Neurological negative. Hematologic: Hematologic/lymphatic negative.  Psychiatric: Psychiatric negative.        Objective:  Objective   Vitals:   05/26/22 0822  BP: (!) 170/79  Pulse: 73  Resp: 20  Temp: 98 F (36.7 C)  SpO2: 95%  Weight: (!) 379 lb (171.9 kg)  Height: 6' (1.829 m)   Body mass index is 51.4 kg/m.  Physical Exam Constitutional:      Appearance: He is obese.  HENT:     Head: Normocephalic.  Abdominal:     General: Abdomen is flat.     Palpations: Abdomen is soft.  Musculoskeletal:     Right lower  leg: Edema present.     Left lower leg: Edema present.  Skin:    Capillary Refill: Capillary refill takes less than 2 seconds.     Comments: Dark discoloration (hyperpigmentation )of the right lower extremity at the ankle and evidence of healed medial malleolar ulcer consistent with C5 venous disease  Neurological:     General: No focal deficit present.     Mental Status: He is alert.  Psychiatric:        Mood and Affect: Mood normal.     Data: Venous Reflux Times  +-------------+---------+------+---------+------------+--------------------  ---+  RIGHT        Reflux NoReflux Reflux  Diameter cmsComments                                          Yes    Time                                          +-------------+---------+------+---------+------------+--------------------  ---+  CFV                    yes  >1 second                                       +-------------+---------+------+---------+------------+--------------------  ---+  FV mid       no                                  very limited                                                                visualization             +-------------+---------+------+---------+------------+--------------------  ---+  FV dist  unable to visualize       +-------------+---------+------+---------+------------+--------------------  ---+  Popliteal              yes  >1 second            chronic thrombus           +-------------+---------+------+---------+------------+--------------------  ---+  GSV at [facility]             yes   >500 ms     0.79                               +-------------+---------+------+---------+------------+--------------------  ---+  GSV prox               yes   >500 ms     0.54                               thigh                                                                        +-------------+---------+------+---------+------------+--------------------  ---+  GSV mid thigh          yes   >500 ms     0.60                               +-------------+---------+------+---------+------------+--------------------  ---+  GSV dist               yes   >500 ms     0.58                               thigh                                                                       +-------------+---------+------+---------+------------+--------------------  ---+  GSV at knee            yes   >500 ms     0.39                               +-------------+---------+------+---------+------------+--------------------  ---+  GSV prox calf          yes   >500 ms     0.55    chronic thrombus           +-------------+---------+------+---------+------------+--------------------  ---+  SSV Pop [place]                          0.24                               +-------------+---------+------+---------+------------+--------------------  ---+  SSV prox  calfno                          0.19                               +-------------+---------+------+---------+------------+--------------------  ---+  SSV mid calf           yes   >500 ms     0.23                               +-------------+---------+------+---------+------------+--------------------  ---+           Summary:  Right:  - Color duplex evaluation of the right lower extremity shows there is  chronic, non-occlusive thrombus in the popliteal vein and greater  saphenous vein (calf).     - Venous reflux is noted in the right common femoral vein.  - Venous reflux is noted in the right sapheno-femoral junction.  - Venous reflux is noted in the right greater saphenous vein in the thigh.  - Venous reflux is noted in the right greater saphenous vein in the calf.  - Venous reflux is noted in the right popliteal vein.  - Venous reflux is noted in the right short  saphenous vein.         Assessment/Plan:    66 year old obese white male with history of C5 venous disease and reflux in the right greater saphenous vein.  He has hyperpigmentation of the right ankle.  He has failed many years of compression therapy as well as attempted exercise and elevation.  I discussed with the patient that reflux in the great saphenous vein is only one component of his issue and he seems to have concomitant lymphedema as well as chronic venous insufficiency.  Given his level of swelling I have recommended lymphedema pumps as well as a referral to bariatric surgery.  We can see him back in 6 months for further evaluation of his great saphenous vein ablation but he certainly needs consideration of weight loss prior to any intervention and also needs better control of his edema.  He demonstrates good understanding today.     Maeola Harman MD [facility]

## 2022-06-02 ENCOUNTER — Encounter: Payer: Self-pay | Admitting: Vascular Surgery

## 2022-06-02 ENCOUNTER — Other Ambulatory Visit: Payer: Self-pay | Admitting: Sports Medicine

## 2022-06-02 ENCOUNTER — Ambulatory Visit
Admission: RE | Admit: 2022-06-02 | Discharge: 2022-06-02 | Disposition: A | Discharge status: Home / Self Care | Payer: Medicare Other | Source: Ambulatory Visit | Attending: Sports Medicine | Admitting: Sports Medicine

## 2022-06-02 DIAGNOSIS — M25512 Pain in left shoulder: Secondary | ICD-10-CM

## 2022-06-07 DIAGNOSIS — E1122 Type 2 diabetes mellitus with diabetic chronic kidney disease: Secondary | ICD-10-CM | POA: Diagnosis not present

## 2022-06-07 DIAGNOSIS — N181 Chronic kidney disease, stage 1: Secondary | ICD-10-CM | POA: Diagnosis not present

## 2022-06-07 DIAGNOSIS — Z794 Long term (current) use of insulin: Secondary | ICD-10-CM | POA: Diagnosis not present

## 2022-06-08 DIAGNOSIS — R197 Diarrhea, unspecified: Secondary | ICD-10-CM | POA: Diagnosis not present

## 2022-06-14 DIAGNOSIS — I10 Essential (primary) hypertension: Secondary | ICD-10-CM | POA: Diagnosis not present

## 2022-06-14 DIAGNOSIS — Z125 Encounter for screening for malignant neoplasm of prostate: Secondary | ICD-10-CM | POA: Diagnosis not present

## 2022-06-14 DIAGNOSIS — E1122 Type 2 diabetes mellitus with diabetic chronic kidney disease: Secondary | ICD-10-CM | POA: Diagnosis not present

## 2022-06-14 DIAGNOSIS — E785 Hyperlipidemia, unspecified: Secondary | ICD-10-CM | POA: Diagnosis not present

## 2022-06-14 DIAGNOSIS — G720 Drug-induced myopathy: Secondary | ICD-10-CM | POA: Diagnosis not present

## 2022-06-14 DIAGNOSIS — R197 Diarrhea, unspecified: Secondary | ICD-10-CM | POA: Diagnosis not present

## 2022-06-24 ENCOUNTER — Encounter: Payer: Self-pay | Admitting: Vascular Surgery

## 2022-06-26 DIAGNOSIS — M5416 Radiculopathy, lumbar region: Secondary | ICD-10-CM | POA: Diagnosis not present

## 2022-07-05 DIAGNOSIS — D23112 Other benign neoplasm of skin of right lower eyelid, including canthus: Secondary | ICD-10-CM | POA: Diagnosis not present

## 2022-07-06 DIAGNOSIS — M19012 Primary osteoarthritis, left shoulder: Secondary | ICD-10-CM | POA: Diagnosis not present

## 2022-07-12 DIAGNOSIS — I890 Lymphedema, not elsewhere classified: Secondary | ICD-10-CM | POA: Diagnosis not present

## 2022-07-14 DIAGNOSIS — E1142 Type 2 diabetes mellitus with diabetic polyneuropathy: Secondary | ICD-10-CM | POA: Diagnosis not present

## 2022-07-14 DIAGNOSIS — E611 Iron deficiency: Secondary | ICD-10-CM | POA: Diagnosis not present

## 2022-07-14 DIAGNOSIS — I10 Essential (primary) hypertension: Secondary | ICD-10-CM | POA: Diagnosis not present

## 2022-07-14 DIAGNOSIS — E785 Hyperlipidemia, unspecified: Secondary | ICD-10-CM | POA: Diagnosis not present

## 2022-07-15 DIAGNOSIS — H9120 Sudden idiopathic hearing loss, unspecified ear: Secondary | ICD-10-CM | POA: Diagnosis not present

## 2022-07-15 DIAGNOSIS — H903 Sensorineural hearing loss, bilateral: Secondary | ICD-10-CM | POA: Diagnosis not present

## 2022-08-13 DIAGNOSIS — H905 Unspecified sensorineural hearing loss: Secondary | ICD-10-CM | POA: Diagnosis not present

## 2022-10-05 DIAGNOSIS — I890 Lymphedema, not elsewhere classified: Secondary | ICD-10-CM | POA: Diagnosis not present

## 2022-10-14 DIAGNOSIS — E1122 Type 2 diabetes mellitus with diabetic chronic kidney disease: Secondary | ICD-10-CM | POA: Diagnosis not present

## 2022-10-14 DIAGNOSIS — I10 Essential (primary) hypertension: Secondary | ICD-10-CM | POA: Diagnosis not present

## 2022-10-14 DIAGNOSIS — I480 Paroxysmal atrial fibrillation: Secondary | ICD-10-CM | POA: Diagnosis not present

## 2022-10-22 ENCOUNTER — Telehealth: Payer: Self-pay | Admitting: Cardiology

## 2022-10-22 NOTE — Telephone Encounter (Signed)
Patient c/o Palpitations:  High priority if patient c/o lightheadedness, shortness of breath, or chest pain  How long have you had palpitations/irregular HR/ Afib? Are you having the symptoms now?  Flutters, occurring at night when laying down Patient's wife states the patient jsut told her about this yesterday so she does not know how long it's been occurring. She states the patient is at work right now.  Are you currently experiencing lightheadedness, SOB or CP?  Not currently with the patient  Do you have a history of afib (atrial fibrillation) or irregular heart rhythm?  Hx of afib  Have you checked your BP or HR? (document readings if available):  BP is around 130/80's, unsure about HR  Are you experiencing any other symptoms?  Weakness

## 2022-10-22 NOTE — Telephone Encounter (Signed)
Spoke with wife as pt was at work.  Pt has reported that when he lays down, he notices irregular heart beat and he has had more weakness and fatigue than usually.  Wife reports he is taking medications as ordered, including Xarelto daily.  Advised to continue to monitor heart rate and BP but if HR is staying above 100 bpm, we will need to know.  Wife states understanding.  Pt has been scheduled for further evaluation on Thursday March 14 at 2:45 with Christen Bame, NP.  Wife appreciative of the call back and assistance.

## 2022-10-26 DIAGNOSIS — G4733 Obstructive sleep apnea (adult) (pediatric): Secondary | ICD-10-CM | POA: Diagnosis not present

## 2022-10-26 NOTE — Progress Notes (Unsigned)
Cardiology Office Note:    Date:  10/28/2022   ID:  Judye Bos, DOB 04/08/1956, MRN [id number]  PCP:  Harlan Stains, MD   [facility] Providers Cardiologist:  Candee Furbish, MD     Referring MD: Harlan Stains, MD   Chief Complaint: permanent atrial fibrillation  History of Present Illness:    Victor Barnes is a very pleasant 67 y.o. male with a hx of atrial fibrillation/flutter, RBBB, recurrent right lower extremity DVT, PE, HTN, HLD, obstructive sleep apnea on CPAP, diabetes, medical noncompliance, and obesity.  History of right leg DVT in 2008 treated with Coumadin for 3 months with recurrent DVT in 2010, lifetime anticoagulation recommended.  Unfortunately, he had pulmonary embolism November 2012 when noncompliant with anticoagulation, and again in April 2014.   Chanetta Marshall, NP is his step daughter.   Initial diagnosis of atrial fibrillation remotely, has been in permanent atrial fibs for several years.  CHA2DS2-VASc score of 5, on Xarelto for [place]. Previously on Sotalol. Recently established with [facility].   Last cardiology clinic visit was 02/18/22 with Dr. Marlou Porch. There was a 3-year absence since previous visit.  He reported prior large abscess to right medial calf that required extensive operative debridement.  Intolerant to [place] because of yeast infection.  Was developing symptoms of peripheral neuropathy likely from diabetes.  Arterial ABIs were normal in 2019.  He reported overall feeling well but experiencing some fatigue.  Has to take breaks when doing activities like mowing the lawn.  BP normally 150/70.  HR well-controlled. Lisinopril 5 mg daily was changed to irbesartan 150 mg daily. TTE ordered for evaluation of SOB and completed on 03/10/2022. LVEF improved from 40% to 65%, mildly dilated ascending aorta at 40 mm, mild LVH, no significant valve disease. Lab results collected at that visit were unremarkable. Recommendation to follow-up in 6  months.  Today, he is here for follow-up of a fib. Hears his heartbeat in his right ear, has had MRI which was inconclusive, hearing is diminished in right ear. Has a high-pitched sound in the ear at all times. Is otherwise asymptomatic with a fib. Went to sleep  medicine provider, he is "totally" intolerant to CPAP because he sleeps on his stomach. Admits he knows he needs to lose weight to improve a lot of his current symptoms. Is considering weight loss surgery. Has been "large" his entire life. Has bilateral LE edema, no recent change. He denies chest pain, shortness of breath, fatigue, palpitations, melena, hematuria, hemoptysis, diaphoresis, weakness, presyncope, syncope, orthopnea, and PND. Reviewed reassuring findings on echo 02/2022.   Past Medical History:  Diagnosis Date   Atrial fibrillation or flutter    Should be on Coumadin but h/o non-compliance; started Sotalol 08/2009   Cellulitis    Diabetes mellitus    DVT (deep venous thrombosis) (HCC)    Recurrent RLE DVT, supposed to be on Coumadin but h/o non-compliance    Hyperlipidemia    Hypertension    Myocardiopathy ([place])    OSA on CPAP    Pulmonary embolism (HCC)    Sleep apnea    Venous stasis     Past Surgical History:  Procedure Laterality Date   LEG SURGERY      Current Medications: Current Meds  Medication Sig   Continuous Blood Gluc Sensor (FREESTYLE LIBRE 3 SENSOR) MISC apply to upper back of arm and change sensor every 14 days for 28 days   ezetimibe (ZETIA) 10 MG tablet Take 1 tablet (10  mg total) by mouth daily.   furosemide (LASIX) 40 MG tablet Take 20 mg by mouth daily. Furosemide 20 mg daily   insulin regular (NOVOLIN R,HUMULIN R) 100 units/mL injection Inject into the skin daily. Patient reports injecting 80 units in the morning, 40 units at lunch time and 60-70 units at night time.   liraglutide (VICTOZA) 18 MG/3ML SOPN Inject 1.2 mg into the skin daily.   metoprolol succinate (TOPROL-XL) 50 MG 24 hr tablet  Take 1 tablet (50 mg total) by mouth 2 (two) times daily. (Patient taking differently: Take 100 mg by mouth daily.)   XARELTO 20 MG TABS tablet Take 20 mg by mouth daily.   [DISCONTINUED] irbesartan (AVAPRO) 150 MG tablet Take 1 tablet (150 mg total) by mouth daily.     Allergies:   Colesevelam, Crestor [rosuvastatin], Empagliflozin, Lipitor [atorvastatin], Metformin hcl er, and Pravastatin   Social History   Socioeconomic History   Marital status: Married    Spouse name: Not on file   Number of children: Not on file   Years of education: Not on file   Highest education level: Not on file  Occupational History   Not on file  Tobacco Use   Smoking status: Former    Types: Cigarettes    Quit date: 03/23/1987    Years since quitting: 35.6    Passive exposure: Never   Smokeless tobacco: Never   Tobacco comments:    Former smoker 10/13/21  Substance and Sexual Activity   Alcohol use: Yes    Comment: occ   Drug use: No   Sexual activity: Not on file  Other Topics Concern   Not on file  Social History Narrative   Not on file   Social Determinants of Health   Financial Resource Strain: Not on file  Food Insecurity: Not on file  Transportation Needs: Not on file  Physical Activity: Not on file  Stress: Not on file  Social Connections: Not on file     Family History: The patient's family history includes Diabetes Mellitus I in his father; Heart attack in his paternal grandmother; Hypertension in his mother and paternal grandmother.  ROS:   Please see the history of present illness.    + pulsatile tinnitus All other systems reviewed and are negative.  Labs/Other Studies Reviewed:    The following studies were reviewed today:   Cardiac Studies & Procedures     STRESS TESTS  NM MYOCAR MULTI W/SPECT W 12/18/2014 Normal perfusion   ECHOCARDIOGRAM  ECHOCARDIOGRAM COMPLETE 03/10/2022  Narrative ECHOCARDIOGRAM REPORT  IMPRESSIONS   1. Left ventricular ejection  fraction, by estimation, is 60 to 65%. The left ventricle has normal function. The left ventricle has no regional wall motion abnormalities. There is mild left ventricular hypertrophy. Left ventricular diastolic parameters are indeterminate. 2. RV is poorly visualized but grossly normal systolic function. The right ventricular size is mildly enlarged. Tricuspid regurgitation signal is inadequate for assessing PA pressure. 3. The mitral valve is grossly normal. Trivial mitral valve regurgitation. 4. The aortic valve was not well visualized. Aortic valve regurgitation is not visualized. No aortic stenosis is present. 5. Aortic dilatation noted. There is dilatation of the aortic root, measuring 40 mm. There is dilatation of the ascending aorta, measuring 40 mm. 6. The inferior vena cava is normal in size with <50% respiratory variability, suggesting right atrial pressure of 8 mmHg.  Comparison(s): 12/12/12 EF 40-45%.  FINDINGS Left Ventricle: Left ventricular ejection fraction, by estimation, is 60 to 65%. The  left ventricle has normal function. The left ventricle has no regional wall motion abnormalities. The left ventricular internal cavity size was normal in size. There is mild left ventricular hypertrophy. Left ventricular diastolic parameters are indeterminate.  Right Ventricle: The right ventricular size is mildly enlarged. Right vetricular wall thickness was not well visualized. Right ventricular systolic function is normal. Tricuspid regurgitation signal is inadequate for assessing PA pressure.  Left Atrium: Left atrial size was normal in size.  Right Atrium: Right atrial size was normal in size.  Pericardium: There is no evidence of pericardial effusion.  Mitral Valve: The mitral valve is grossly normal. Trivial mitral valve regurgitation.  Tricuspid Valve: The tricuspid valve is grossly normal. Tricuspid valve regurgitation is mild.  Aortic Valve: The aortic valve was not well  visualized. Aortic valve regurgitation is not visualized. No aortic stenosis is present.  Pulmonic Valve: The pulmonic valve was not well visualized. Pulmonic valve regurgitation is not visualized.  Aorta: Aortic dilatation noted. There is dilatation of the aortic root, measuring 40 mm. There is dilatation of the ascending aorta, measuring 40 mm.  Venous: The inferior vena cava is normal in size with less than 50% respiratory variability, suggesting right atrial pressure of 8 mmHg.  IAS/Shunts: The interatrial septum was not well visualized.                Recent Labs: 02/18/2022: ALT 14; BUN 12; Creatinine, Ser 0.84; Hemoglobin 14.4; Platelets 242; Potassium 4.0; Sodium 142  Recent Lipid Panel No results found for: "CHOL", "TRIG", "HDL", "CHOLHDL", "VLDL", "LDLCALC", "LDLDIRECT"   Risk Assessment/Calculations:    CHA2DS2-VASc Score = 5  This indicates a 7.2% annual risk of stroke. The patient's score is based upon: CHF History: 1 HTN History: 1 Diabetes History: 1 Stroke History: 0 Vascular Disease History: 1 Age Score: 1 Gender Score: 0  Physical Exam:    VS:  BP 134/78   Pulse 70   Ht 6' (1.829 m)   Wt (!) 382 lb 6.4 oz (173.5 kg)   SpO2 95%   BMI 51.86 kg/m     Wt Readings from Last 3 Encounters:  10/28/22 (!) 382 lb 6.4 oz (173.5 kg)  05/26/22 (!) 379 lb (171.9 kg)  02/22/22 (!) 355 lb 14.4 oz (161.4 kg)     GEN: Obese, well developed in no acute distress HEENT: Normal NECK: No JVD; No carotid bruits CARDIAC: Irregular RR, no murmurs, rubs, gallops RESPIRATORY:  Clear to auscultation without rales, wheezing or rhonchi  ABDOMEN: Soft, non-tender, non-distended MUSCULOSKELETAL:  No edema; No deformity. 2+ pedal pulses, equal bilaterally SKIN: Warm and dry NEUROLOGIC:  Alert and oriented x 3 PSYCHIATRIC:  Normal affect   EKG:  EKG is ordered today.  The ekg ordered today demonstrates atrial fibrillation at 70 bpm, RBBB, no acute ST abnormality     Diagnoses:    1. Aortic root aneurysm ([place])   2. Permanent atrial fibrillation ([place])   3. Essential hypertension   4. Pulsatile tinnitus of right ear    Assessment and Plan:     Pulsatile tinnitus: He is hearing irregular heartbeat when trying to sleep. Had MRI of right ear which he reports was inconclusive. Encouraged him to ensure good hydration and follow-up with ENT. I do not think a higher dose of metoprolol is warranted.   Aortic root aneurysm: Measures 40 mm on echo 03/10/22. Plan to repeat echo in 1 year per Dr. Marlou Porch. Will go ahead and schedule echo for July 2024. Avoid fluoroquinolone antibiotics  and heavy lifting.   Hypertension: BP is well controlled  Permanent atrial fibrillation on chronic anticoagulation: Atrial fibrillation ablation with well-controlled ventricular rate on EKG today.  As noted above, notes irregular HR in his ears when trying to sleep.  Continue metoprolol for rate control. No bleeding problems on Xarelto.   Hyperlipidemia: LDL 125 on 09/11/21.  Discussed the importance of lowering LDL <100, ultimately as low as possible.  He is agreeable to start Zetia 10 mg once daily.  We will recheck fasting lipid panel in 2 months.  Obesity: Lengthy discussion about the impact his weight is having on his overall health. He is motivated to lose weight but has struggled with obesity his entire life. Does not feel that his diet warrants how much he weighs. Encouraged trying small portion sizes as if he had already had lap band surgery. Avoid sugar, high saturated fat and processed foods.      Disposition: 9 months with Dr. Marlou Porch  Medication Adjustments/Labs and Tests Ordered: Current medicines are reviewed at length with the patient today.  Concerns regarding medicines are outlined above.  Orders Placed This Encounter  Procedures   ALT   Lipid Profile   EKG 12-Lead   Meds ordered this encounter  Medications   ezetimibe (ZETIA) 10 MG tablet    Sig: Take 1  tablet (10 mg total) by mouth daily.    Dispense:  30 tablet    Refill:  11    Patient Instructions  Medication Instructions:   START zetia one (1) tablet by mouth ( 10 mg) daily.   *If you need a refill on your cardiac medications before your next appointment, please call your pharmacy*   Lab Work:  Your physician recommends that you return for a FASTING lipid profile/ALT ON Wednesday, May 15. You can come in on the day of your appointment anytime between 7:30-4:30 fasting from midnight the night before.    If you have labs (blood work) drawn today and your tests are completely normal, you will receive your results only by: [place] (if you have MyChart) OR A paper copy in the mail If you have any lab test that is abnormal or we need to change your treatment, we will call you to review the results.  Testing/Procedures:  Your physician has requested that you have an echocardiogram. Echocardiography is a painless test that uses sound waves to create images of your heart. It provides your doctor with information about the size and shape of your heart and how well your heart's chambers and valves are working. This procedure takes approximately one hour. There are no restrictions for this procedure. Please do NOT wear cologne, aftershave, or lotions (deodorant is allowed). Please arrive 15 minutes prior to your appointment time.  Follow-Up: At [facility], you and your health needs are our priority.  As part of our continuing mission to provide you with exceptional heart care, we have created designated Provider Care Teams.  These Care Teams include your primary Cardiologist (physician) and Advanced Practice Providers (APPs -  Physician Assistants and Nurse Practitioners) who all work together to provide you with the care you need, when you need it.  We recommend signing up for the patient portal called "MyChart".  Sign up information is provided on this After Visit  Summary.  MyChart is used to connect with patients for Virtual Visits (Telemedicine).  Patients are able to view lab/test results, encounter notes, upcoming appointments, etc.  Non-urgent messages can be sent to  your provider as well.   To learn more about what you can do with MyChart, go to [email or web address].    Your next appointment:   10 month(s)  Provider:   Candee Furbish, MD     Other Instructions  Your physician wants you to follow-up in: 10 months with Dr. Marlou Porch.  You will receive a reminder letter in the mail two months in advance. If you don't receive a letter, please call our office to schedule the follow-up appointment.     Signed, Emmaline Life, NP  10/28/2022 5:47 PM    [place]

## 2022-10-28 ENCOUNTER — Encounter: Payer: Self-pay | Admitting: Nurse Practitioner

## 2022-10-28 ENCOUNTER — Ambulatory Visit: Payer: Medicare Other | Attending: Nurse Practitioner | Admitting: Nurse Practitioner

## 2022-10-28 VITALS — BP 134/78 | HR 70 | Ht 72.0 in | Wt 382.4 lb

## 2022-10-28 DIAGNOSIS — D6869 Other thrombophilia: Secondary | ICD-10-CM

## 2022-10-28 DIAGNOSIS — I7121 Aneurysm of the ascending aorta, without rupture: Secondary | ICD-10-CM

## 2022-10-28 DIAGNOSIS — I10 Essential (primary) hypertension: Secondary | ICD-10-CM | POA: Diagnosis not present

## 2022-10-28 DIAGNOSIS — I4821 Permanent atrial fibrillation: Secondary | ICD-10-CM

## 2022-10-28 DIAGNOSIS — H93A1 Pulsatile tinnitus, right ear: Secondary | ICD-10-CM | POA: Diagnosis not present

## 2022-10-28 DIAGNOSIS — Q2543 Congenital aneurysm of aorta: Secondary | ICD-10-CM

## 2022-10-28 MED ORDER — EZETIMIBE 10 MG PO TABS: 10.0000 mg | ORAL_TABLET | Freq: Every day | ORAL | 11 refills | Status: DC

## 2022-10-28 NOTE — Patient Instructions (Signed)
Medication Instructions:   START zetia one (1) tablet by mouth ( 10 mg) daily.   *If you need a refill on your cardiac medications before your next appointment, please call your pharmacy*   Lab Work:  Your physician recommends that you return for a FASTING lipid profile/ALT ON Wednesday, May 15. You can come in on the day of your appointment anytime between 7:30-4:30 fasting from midnight the night before.    If you have labs (blood work) drawn today and your tests are completely normal, you will receive your results only by: [place] (if you have MyChart) OR A paper copy in the mail If you have any lab test that is abnormal or we need to change your treatment, we will call you to review the results.  Testing/Procedures:  Your physician has requested that you have an echocardiogram. Echocardiography is a painless test that uses sound waves to create images of your heart. It provides your doctor with information about the size and shape of your heart and how well your heart's chambers and valves are working. This procedure takes approximately one hour. There are no restrictions for this procedure. Please do NOT wear cologne, aftershave, or lotions (deodorant is allowed). Please arrive 15 minutes prior to your appointment time.  Follow-Up: At [facility], you and your health needs are our priority.  As part of our continuing mission to provide you with exceptional heart care, we have created designated Provider Care Teams.  These Care Teams include your primary Cardiologist (physician) and Advanced Practice Providers (APPs -  Physician Assistants and Nurse Practitioners) who all work together to provide you with the care you need, when you need it.  We recommend signing up for the patient portal called "MyChart".  Sign up information is provided on this After Visit Summary.  MyChart is used to connect with patients for Virtual Visits (Telemedicine).  Patients are able to view  lab/test results, encounter notes, upcoming appointments, etc.  Non-urgent messages can be sent to your provider as well.   To learn more about what you can do with MyChart, go to [email or web address].    Your next appointment:   10 month(s)  Provider:   Candee Furbish, MD     Other Instructions  Your physician wants you to follow-up in: 10 months with Victor Barnes.  You will receive a reminder letter in the mail two months in advance. If you don't receive a letter, please call our office to schedule the follow-up appointment.

## 2022-11-04 DIAGNOSIS — M19012 Primary osteoarthritis, left shoulder: Secondary | ICD-10-CM | POA: Diagnosis not present

## 2022-12-01 ENCOUNTER — Other Ambulatory Visit: Payer: Self-pay | Admitting: Sports Medicine

## 2022-12-01 ENCOUNTER — Ambulatory Visit
Admission: RE | Admit: 2022-12-01 | Discharge: 2022-12-01 | Disposition: A | Discharge status: Home / Self Care | Payer: Medicare Other | Source: Ambulatory Visit | Attending: Sports Medicine | Admitting: Sports Medicine

## 2022-12-01 DIAGNOSIS — M25561 Pain in right knee: Secondary | ICD-10-CM

## 2022-12-08 DIAGNOSIS — E1122 Type 2 diabetes mellitus with diabetic chronic kidney disease: Secondary | ICD-10-CM | POA: Diagnosis not present

## 2022-12-08 DIAGNOSIS — N181 Chronic kidney disease, stage 1: Secondary | ICD-10-CM | POA: Diagnosis not present

## 2022-12-08 DIAGNOSIS — Z794 Long term (current) use of insulin: Secondary | ICD-10-CM | POA: Diagnosis not present

## 2022-12-16 DIAGNOSIS — H903 Sensorineural hearing loss, bilateral: Secondary | ICD-10-CM | POA: Diagnosis not present

## 2022-12-21 DIAGNOSIS — N181 Chronic kidney disease, stage 1: Secondary | ICD-10-CM | POA: Diagnosis not present

## 2022-12-21 DIAGNOSIS — I10 Essential (primary) hypertension: Secondary | ICD-10-CM | POA: Diagnosis not present

## 2022-12-21 DIAGNOSIS — E1122 Type 2 diabetes mellitus with diabetic chronic kidney disease: Secondary | ICD-10-CM | POA: Diagnosis not present

## 2022-12-21 DIAGNOSIS — E785 Hyperlipidemia, unspecified: Secondary | ICD-10-CM | POA: Diagnosis not present

## 2022-12-22 ENCOUNTER — Other Ambulatory Visit: Payer: Self-pay

## 2022-12-22 DIAGNOSIS — I4821 Permanent atrial fibrillation: Secondary | ICD-10-CM

## 2022-12-22 MED ORDER — XARELTO 20 MG PO TABS: 20.0000 mg | ORAL_TABLET | Freq: Every day | ORAL | 1 refills | Status: DC

## 2022-12-22 NOTE — Telephone Encounter (Signed)
Prescription refill request for Xarelto received.  Indication:Aflutter Last office visit: 10/28/22 ([place])  Weight: 173.5kg Age: 67 Scr: 0.84 (02/18/22)  CrCl: 212.12ml/min  Appropriate dose. Refill sent.

## 2022-12-29 ENCOUNTER — Ambulatory Visit: Payer: Medicare Other

## 2023-01-05 ENCOUNTER — Ambulatory Visit: Payer: Medicare Other | Admitting: Physician Assistant

## 2023-01-05 ENCOUNTER — Ambulatory Visit: Payer: Medicare Other | Attending: Nurse Practitioner

## 2023-01-05 VITALS — BP 146/79 | HR 83 | Temp 97.7°F | Wt 382.0 lb

## 2023-01-05 DIAGNOSIS — I872 Venous insufficiency (chronic) (peripheral): Secondary | ICD-10-CM | POA: Diagnosis not present

## 2023-01-05 DIAGNOSIS — I890 Lymphedema, not elsewhere classified: Secondary | ICD-10-CM | POA: Diagnosis not present

## 2023-01-05 DIAGNOSIS — I4821 Permanent atrial fibrillation: Secondary | ICD-10-CM

## 2023-01-05 DIAGNOSIS — I10 Essential (primary) hypertension: Secondary | ICD-10-CM | POA: Diagnosis not present

## 2023-01-05 DIAGNOSIS — Q2543 Congenital aneurysm of aorta: Secondary | ICD-10-CM

## 2023-01-05 DIAGNOSIS — I7121 Aneurysm of the ascending aorta, without rupture: Secondary | ICD-10-CM | POA: Diagnosis not present

## 2023-01-05 NOTE — Progress Notes (Signed)
Office Note     CC:  follow up Requesting Provider:  Laurann Montana, MD  HPI: Victor Barnes is a 67 y.o. (Oct 15, 1955) male who presents for evaluation of bilateral lower extremity venous insufficiency and lymphedema.  He was last seen in the office by Dr. Randie Heinz in October of last year.  Recommendations included weight loss with referral to bariatric surgery clinic.  Patient states despite wearing compression stockings above the knee on a daily basis, periodic elevation of the legs, and avoiding prolonged sitting and standing that his symptoms are persistent and have not improved.  Unfortunately, he has not been successful with weight loss in that timeframe however was recently started on Ozempic by his endocrinologist and is optimistic.  He also has not been evaluated at the bariatric clinic.  He does have lymphedema pumps which he uses on a regular basis.  Past medical history significant for right lower extremity DVT with PE.  He has known deep and superficial venous insufficiency of the right lower extremity.  He denies tobacco use.  He is accompanied today by his wife.   Past Medical History:  Diagnosis Date   Atrial fibrillation or flutter    Should be on Coumadin but h/o non-compliance; started Sotalol 08/2009   Cellulitis    Diabetes mellitus    DVT (deep venous thrombosis) (HCC)    Recurrent RLE DVT, supposed to be on Coumadin but h/o non-compliance    Hyperlipidemia    Hypertension    Myocardiopathy (HCC)    OSA on CPAP    Pulmonary embolism (HCC)    Sleep apnea    Venous stasis     Past Surgical History:  Procedure Laterality Date   LEG SURGERY      Social History   Socioeconomic History   Marital status: Married    Spouse name: Not on file   Number of children: Not on file   Years of education: Not on file   Highest education level: Not on file  Occupational History   Not on file  Tobacco Use   Smoking status: Former    Types: Cigarettes    Quit date:  03/23/1987    Years since quitting: 35.8    Passive exposure: Never   Smokeless tobacco: Never   Tobacco comments:    Former smoker 10/13/21  Substance and Sexual Activity   Alcohol use: Yes    Comment: occ   Drug use: No   Sexual activity: Not on file  Other Topics Concern   Not on file  Social History Narrative   Not on file   Social Determinants of Health   Financial Resource Strain: Not on file  Food Insecurity: Not on file  Transportation Needs: Not on file  Physical Activity: Not on file  Stress: Not on file  Social Connections: Not on file  Intimate Partner Violence: Not on file    Family History  Problem Relation Age of Onset   Hypertension Mother    Diabetes Mellitus I Father    Hypertension Paternal Grandmother    Heart attack Paternal Grandmother     Current Outpatient Medications  Medication Sig Dispense Refill   Continuous Blood Gluc Sensor (FREESTYLE LIBRE 3 SENSOR) MISC apply to upper back of arm and change sensor every 14 days for 28 days     ezetimibe (ZETIA) 10 MG tablet Take 1 tablet (10 mg total) by mouth daily. 30 tablet 11   furosemide (LASIX) 40 MG tablet Take 20 mg by mouth  daily. Furosemide 20 mg daily     insulin regular (NOVOLIN R,HUMULIN R) 100 units/mL injection Inject into the skin daily. Patient reports injecting 80 units in the morning, 40 units at lunch time and 60-70 units at night time.     metoprolol succinate (TOPROL-XL) 50 MG 24 hr tablet Take 1 tablet (50 mg total) by mouth 2 (two) times daily. (Patient taking differently: Take 100 mg by mouth daily.) 180 tablet 3   Semaglutide (OZEMPIC, 0.25 OR 0.5 MG/DOSE, [place]) Inject 0.5 mg into the skin once a week.     XARELTO 20 MG TABS tablet Take 1 tablet (20 mg total) by mouth daily. 90 tablet 1   No current facility-administered medications for this visit.    Allergies  Allergen Reactions   Colesevelam     Other reaction(s): Other (See Comments)   Crestor [Rosuvastatin] Other (See  Comments)    myalgias   Empagliflozin     Other reaction(s): Other (See Comments), yeast infections   Lipitor [Atorvastatin] Other (See Comments)    myalgias   Metformin Hcl Er     Other reaction(s): diarrhea   Pravastatin Other (See Comments)    myalgias     REVIEW OF SYSTEMS:   [X]  denotes positive finding, [ ]  denotes negative finding Cardiac  Comments:  Chest pain or chest pressure:    Shortness of breath upon exertion:    Short of breath when lying flat:    Irregular heart rhythm:        Vascular    Pain in calf, thigh, or hip brought on by ambulation:    Pain in feet at night that wakes you up from your sleep:     Blood clot in your veins:    Leg swelling:         Pulmonary    Oxygen at home:    Productive cough:     Wheezing:         Neurologic    Sudden weakness in arms or legs:     Sudden numbness in arms or legs:     Sudden onset of difficulty speaking or slurred speech:    Temporary loss of vision in one eye:     Problems with dizziness:         Gastrointestinal    Blood in stool:     Vomited blood:         Genitourinary    Burning when urinating:     Blood in urine:        Psychiatric    Major depression:         Hematologic    Bleeding problems:    Problems with blood clotting too easily:        Skin    Rashes or ulcers:        Constitutional    Fever or chills:      PHYSICAL EXAMINATION:  Vitals:   01/05/23 0828  BP: (!) 146/79  Pulse: 83  Temp: 97.7 F (36.5 C)  TempSrc: Temporal  SpO2: 96%  Weight: (!) 382 lb (173.3 kg)    General:  WDWN in NAD; vital signs documented above Gait: Not observed HENT: WNL, normocephalic Pulmonary: normal non-labored breathing , without Rales, rhonchi,  wheezing Cardiac: regular HR Abdomen: soft, NT, no masses Skin: without rashes Vascular Exam/Pulses: Palpable DP pulses bilaterally Extremities: without ischemic changes, without Gangrene , without cellulitis; without open wounds;  lymphedema bilateral lower extremities affecting the toes and forefoot; pigmentation changes  with scaly skin of both medial lower legs Musculoskeletal: no muscle wasting or atrophy  Neurologic: A&O X 3 Psychiatric:  The pt has Normal affect.   Non-Invasive Vascular Imaging:    none   ASSESSMENT/PLAN:: 67 y.o. male here for follow up for evaluation of bilateral lower extremity edema and lymphedema without symptom relief despite maximizing conservative management  -Mr. Victor Barnes is a 67 year old male who has been followed in our office for bilateral lower extremity venous insufficiency and lymphedema.  Right leg is more symptomatic than the left due to history of extensive DVT and associated PE.  He has been wearing above-the-knee compression stockings, using lymphedema pumps on a daily basis.  He also has been trying to elevate his legs periodically and avoid prolonged sitting standing during the day.  Unfortunately he has not had much success with weight loss.  He also has not been to the bariatric clinic for evaluation.  He may be a candidate for greater saphenous vein ablation in the right lower extremity however would need to lose weight prior to considering this.  We reviewed today that right lower extremity edema is multifactorial and would not be completely cured with ablation therapy alone.  Recommendations include continued use of compression and lymphedema pumps on a daily basis.  I stressed the importance of elevation and avoiding prolonged sitting and standing during the day.  I will also refer him again to the bariatric clinic.  He will need to lose about 50 pounds prior to considering him for laser ablation therapy.  We will repeat right lower extremity reflux study in about 3 to 6 months.  Patient knows to call/return office sooner with any questions or concerns.   Emilie Rutter, PA-C [facility] [phone]  Clinic MD:   Randie Heinz

## 2023-01-06 LAB — LIPID PANEL
Chol/HDL Ratio: 5.3 ratio — ABNORMAL HIGH (ref 0.0–5.0)
Cholesterol, Total: 164 mg/dL (ref 100–199)
HDL: 31 mg/dL — ABNORMAL LOW (ref >39)
LDL Chol Calc (NIH): 112 mg/dL — ABNORMAL HIGH (ref 0–99)
Triglycerides: 112 mg/dL (ref 0–149)
VLDL Cholesterol Cal: 21 mg/dL (ref 5–40)

## 2023-01-06 LAB — ALT: ALT: 19 IU/L (ref 0–44)

## 2023-01-13 ENCOUNTER — Other Ambulatory Visit: Payer: Self-pay

## 2023-01-13 DIAGNOSIS — I872 Venous insufficiency (chronic) (peripheral): Secondary | ICD-10-CM

## 2023-01-14 DIAGNOSIS — E1122 Type 2 diabetes mellitus with diabetic chronic kidney disease: Secondary | ICD-10-CM | POA: Diagnosis not present

## 2023-01-14 DIAGNOSIS — I480 Paroxysmal atrial fibrillation: Secondary | ICD-10-CM | POA: Diagnosis not present

## 2023-01-14 DIAGNOSIS — I10 Essential (primary) hypertension: Secondary | ICD-10-CM | POA: Diagnosis not present

## 2023-01-14 DIAGNOSIS — D6869 Other thrombophilia: Secondary | ICD-10-CM | POA: Diagnosis not present

## 2023-02-01 DIAGNOSIS — E1122 Type 2 diabetes mellitus with diabetic chronic kidney disease: Secondary | ICD-10-CM | POA: Diagnosis not present

## 2023-02-01 DIAGNOSIS — N181 Chronic kidney disease, stage 1: Secondary | ICD-10-CM | POA: Diagnosis not present

## 2023-02-01 DIAGNOSIS — Z794 Long term (current) use of insulin: Secondary | ICD-10-CM | POA: Diagnosis not present

## 2023-02-10 ENCOUNTER — Other Ambulatory Visit: Payer: Self-pay | Admitting: Orthopedic Surgery

## 2023-02-10 ENCOUNTER — Telehealth: Payer: Self-pay | Admitting: *Deleted

## 2023-02-10 DIAGNOSIS — M19012 Primary osteoarthritis, left shoulder: Secondary | ICD-10-CM

## 2023-02-10 NOTE — Telephone Encounter (Signed)
Pharmacy please advise on holding Xarelto prior to left reverse shoulder arthroplasty scheduled for TBD. Thank you.

## 2023-02-10 NOTE — Telephone Encounter (Signed)
   Pre-operative Risk Assessment    Patient Name: Victor Barnes  DOB: 1956/04/02 MRN: [id number]      Request for Surgical Clearance    Procedure:   LEFT REVERSE SHOULDER ARTHROPLASTY  Date of Surgery:  Clearance TBD                                 Surgeon:  DR. Caryn Bee SUPPLE Surgeon's Group or Practice Name:  [facility] Phone number:  [phone] ATTN: Aida Raider Fax number:  [phone]   Type of Clearance Requested:   - Medical  - Pharmacy:  Hold Rivaroxaban (Xarelto)     Type of Anesthesia:  General    Additional requests/questions:    [name]   02/10/2023, 3:53 PM

## 2023-02-11 NOTE — Telephone Encounter (Signed)
Patient with diagnosis of atrial fibrillation on Xarelto for anticoagulation.    Procedure: left reverse shoulder arthroplast Date of procedure: TBD   CHA2DS2-VASc Score = 5   This indicates a 7.2% annual risk of stroke. The patient's score is based upon: CHF History: 1 HTN History: 1 Diabetes History: 1 Stroke History: 0 Vascular Disease History: 1 Age Score: 1 Gender Score: 0   Has history of DVT x 2, put on lifelong anticoagulation at second event, later had PE 2/2 noncompliance with anticoagulation (2012 then again 2014)  CrCl 149 (with adjusted body weight) Platelet count 242  Per office protocol, patient can hold Xarelto for 3 days prior to procedure.   Patient will not need bridging with Lovenox (enoxaparin) around procedure.  **This guidance is not considered finalized until pre-operative APP has relayed final recommendations.**

## 2023-02-11 NOTE — Telephone Encounter (Signed)
   Name: Victor Barnes  DOB: Mar 05, 1956  MRN: [id number]  Primary Cardiologist: Donato Schultz, MD   Preoperative team, please contact this patient and set up a phone call appointment for further preoperative risk assessment. Please obtain consent and complete medication review. Thank you for your help.  I confirm that guidance regarding antiplatelet and oral anticoagulation therapy has been completed and, if necessary, noted below.   Per office protocol, patient can hold Xarelto for 3 days prior to procedure.   Patient will not need bridging with Lovenox (enoxaparin) around procedure.  Napoleon Form, Leodis Rains, NP 02/11/2023, 9:57 AM [facility]

## 2023-02-23 ENCOUNTER — Ambulatory Visit (HOSPITAL_COMMUNITY): Payer: Medicare Other | Attending: Internal Medicine

## 2023-02-23 DIAGNOSIS — I7789 Other specified disorders of arteries and arterioles: Secondary | ICD-10-CM | POA: Diagnosis not present

## 2023-02-23 LAB — ECHOCARDIOGRAM COMPLETE
Area-P 1/2: 3.35 cm2
S' Lateral: 3.5 cm

## 2023-02-23 MED ORDER — PERFLUTREN LIPID MICROSPHERE
3.0000 mL | INTRAVENOUS | Status: AC | PRN
Start: 2023-02-23 — End: 2023-02-23
  Administered 2023-02-23: 3 mL via INTRAVENOUS

## 2023-03-04 DIAGNOSIS — E1122 Type 2 diabetes mellitus with diabetic chronic kidney disease: Secondary | ICD-10-CM | POA: Diagnosis not present

## 2023-03-04 DIAGNOSIS — Z0000 Encounter for general adult medical examination without abnormal findings: Secondary | ICD-10-CM | POA: Diagnosis not present

## 2023-03-04 DIAGNOSIS — Z794 Long term (current) use of insulin: Secondary | ICD-10-CM | POA: Diagnosis not present

## 2023-03-04 DIAGNOSIS — I480 Paroxysmal atrial fibrillation: Secondary | ICD-10-CM | POA: Diagnosis not present

## 2023-03-04 DIAGNOSIS — D6869 Other thrombophilia: Secondary | ICD-10-CM | POA: Diagnosis not present

## 2023-03-08 ENCOUNTER — Ambulatory Visit
Admission: RE | Admit: 2023-03-08 | Discharge: 2023-03-08 | Disposition: A | Discharge status: Home / Self Care | Payer: Medicare Other | Source: Ambulatory Visit | Attending: Orthopedic Surgery | Admitting: Orthopedic Surgery

## 2023-03-08 DIAGNOSIS — M19012 Primary osteoarthritis, left shoulder: Secondary | ICD-10-CM

## 2023-03-16 ENCOUNTER — Encounter: Payer: Self-pay | Admitting: Cardiology

## 2023-03-16 NOTE — Telephone Encounter (Signed)
Pre op APP stated that he had sent back to pre op call back to set the pt up with a tele appt for pre op. Not sure if may have been missed in error. I assured pre op APP that I will check with the surgeon's office if surgery has been scheduled for surgery and to add the pt to pre op schedule for tomorrow per his request Robin Searing, NP pre op APP 03/16/23.   I left message for the pt to call back to schedule a tele pre op appt tomorrow. Left message apologizing for any error on our end that we missed that he needed a tele appt.   I left message for the surgeon's office to see if the surgery has been scheduled yet.

## 2023-03-17 ENCOUNTER — Telehealth: Payer: Self-pay | Admitting: *Deleted

## 2023-03-17 ENCOUNTER — Ambulatory Visit: Payer: Medicare Other | Attending: Student | Admitting: Student

## 2023-03-17 DIAGNOSIS — Z01810 Encounter for preprocedural cardiovascular examination: Secondary | ICD-10-CM | POA: Diagnosis not present

## 2023-03-17 NOTE — Telephone Encounter (Signed)
Pt called back and has been scheduled for tele pre op appt. Per preop APP yesterday Robin Searing, NP to add the pt to today for tele pre op appt. Med rec and consent are done.     Patient Consent for Virtual Visit        Victor Barnes has provided verbal consent on 03/17/2023 for a virtual visit (video or telephone).   CONSENT FOR VIRTUAL VISIT FOR:  Victor Barnes  By participating in this virtual visit I agree to the following:  I hereby voluntarily request, consent and authorize [facility] and its employed or contracted physicians, physician assistants, nurse practitioners or other licensed health care professionals (the Practitioner), to provide me with telemedicine health care services (the "Services") as deemed necessary by the treating Practitioner. I acknowledge and consent to receive the Services by the Practitioner via telemedicine. I understand that the telemedicine visit will involve communicating with the Practitioner through live audiovisual communication technology and the disclosure of certain medical information by electronic transmission. I acknowledge that I have been given the opportunity to request an in-person assessment or other available alternative prior to the telemedicine visit and am voluntarily participating in the telemedicine visit.  I understand that I have the right to withhold or withdraw my consent to the use of telemedicine in the course of my care at any time, without affecting my right to future care or treatment, and that the Practitioner or I may terminate the telemedicine visit at any time. I understand that I have the right to inspect all information obtained and/or recorded in the course of the telemedicine visit and may receive copies of available information for a reasonable fee.  I understand that some of the potential risks of receiving the Services via telemedicine include:  Delay or interruption in medical evaluation due to  technological equipment failure or disruption; Information transmitted may not be sufficient (e.g. poor resolution of images) to allow for appropriate medical decision making by the Practitioner; and/or  In rare instances, security protocols could fail, causing a breach of personal health information.  Furthermore, I acknowledge that it is my responsibility to provide information about my medical history, conditions and care that is complete and accurate to the best of my ability. I acknowledge that Practitioner's advice, recommendations, and/or decision may be based on factors not within their control, such as incomplete or inaccurate data provided by me or distortions of diagnostic images or specimens that may result from electronic transmissions. I understand that the practice of medicine is not an exact science and that Practitioner makes no warranties or guarantees regarding treatment outcomes. I acknowledge that a copy of this consent can be made available to me via my patient portal Victor Barnes), or I can request a printed copy by calling the office of Victor Barnes.    I understand that my insurance will be billed for this visit.   I have read or had this consent read to me. I understand the contents of this consent, which adequately explains the benefits and risks of the Services being provided via telemedicine.  I have been provided ample opportunity to ask questions regarding this consent and the Services and have had my questions answered to my satisfaction. I give my informed consent for the services to be provided through the use of telemedicine in my medical care

## 2023-03-17 NOTE — Telephone Encounter (Signed)
Pt called back and has been scheduled for tele pre op appt. Per preop APP yesterday Robin Searing, NP to add the pt to today for tele pre op appt. Med rec and consent are done.

## 2023-03-17 NOTE — Progress Notes (Signed)
Virtual Visit via Telephone Note   Because of Victor Barnes's co-morbid illnesses, he is at least at moderate risk for complications without adequate follow up.  This format is felt to be most appropriate for this patient at this time.  The patient did not have access to video technology/had technical difficulties with video requiring transitioning to audio format only (telephone).  All issues noted in this document were discussed and addressed.  No physical exam could be performed with this format.  Please refer to the patient's chart for his consent to telehealth for [facility].  Evaluation Performed:  Preoperative cardiovascular risk assessment _____________   Date:  03/17/2023   Patient ID:  Victor Barnes, DOB 03-18-1956, MRN [id number] Patient Location:  Home Provider location:   Office  Primary Care Provider:  Laurann Montana, MD Primary Cardiologist:  Donato Schultz, MD  Chief Complaint / Patient Profile   67 y.o. y/o male with a h/o permanent A-fib on anticoagulation, hypertension, DVT/PE, T2DM, OSA aortic dilatation stable on last echo July 2024 who is pending left reverse shoulder arthroplasty by Dr. Rennis Chris and presents today for telephonic preoperative cardiovascular risk assessment.  History of Present Illness    Victor Barnes is a 67 y.o. male who presents via audio/video conferencing for a telehealth visit today.  Pt was last seen in cardiology clinic on 10/28/2022 by Eligha Bridegroom, NP.  At that time Victor Barnes was doing well.  The patient is now pending procedure as outlined above. Since his last visit, he is doing well from a cardiac standpoint. Patient denies shortness of breath or dyspnea on exertion. No chest pain, pressure, or tightness. Denies orthopnea or PND. No palpitations.  He has chronic lower extremity edema secondary to lymphedema. His activity is limited by knee pain. 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 He also  performs yard work once a week.   Past Medical History    Past Medical History:  Diagnosis Date   Atrial fibrillation or flutter    Should be on Coumadin but h/o non-compliance; started Sotalol 08/2009   Cellulitis    Diabetes mellitus    DVT (deep venous thrombosis) (HCC)    Recurrent RLE DVT, supposed to be on Coumadin but h/o non-compliance    Hyperlipidemia    Hypertension    Myocardiopathy (HCC)    OSA on CPAP    Pulmonary embolism (HCC)    Sleep apnea    Venous stasis    Past Surgical History:  Procedure Laterality Date   LEG SURGERY      Allergies  Allergies  Allergen Reactions   Colesevelam     Other reaction(s): Other (See Comments)   Crestor [Rosuvastatin] Other (See Comments)    myalgias   Empagliflozin     Other reaction(s): Other (See Comments), yeast infections   Lipitor [Atorvastatin] Other (See Comments)    myalgias   Metformin Hcl Er     Other reaction(s): diarrhea   Pravastatin Other (See Comments)    myalgias    Home Medications    Prior to Admission medications   Medication Sig Start Date End Date Taking? Authorizing Provider  Continuous Blood Gluc Sensor (FREESTYLE LIBRE 3 SENSOR) MISC apply to upper back of arm and change sensor every 14 days for 28 days 06/21/22   [provider]  ezetimibe (ZETIA) 10 MG tablet Take 1 tablet (10 mg total) by mouth daily. Patient not taking: Reported on 03/17/2023 10/28/22  10/28/23  Swinyer, Zachary George, NP  furosemide (LASIX) 40 MG tablet Take 40 mg by mouth daily. Furosemide 20 mg daily    [provider]  insulin regular (NOVOLIN R,HUMULIN R) 100 units/mL injection Inject into the skin daily. Patient reports injecting 80 units in the morning, 40 units at lunch time and 60-70 units at night time.    [provider]  lisinopril (ZESTRIL) 10 MG tablet Take 10 mg by mouth daily. 02/19/23   [provider]  metoprolol succinate (TOPROL-XL) 50 MG 24 hr tablet Take 1 tablet (50 mg  total) by mouth 2 (two) times daily. Patient taking differently: Take 100 mg by mouth daily. 10/13/21   Fenton, Clint R, PA  Semaglutide (OZEMPIC, 0.25 OR 0.5 MG/DOSE, [place]) Inject 0.5 mg into the skin once a week.    [provider]  XARELTO 20 MG TABS tablet Take 1 tablet (20 mg total) by mouth daily. 12/22/22   Jake Bathe, MD    Physical Exam    Vital Signs:  Victor Barnes does not have vital signs available for review today.  Given telephonic nature of communication, physical exam is limited. AAOx3. NAD. Normal affect.  Speech and respirations are unlabored.  Accessory Clinical Findings    None  Assessment & Plan    Primary Cardiologist: Donato Schultz, MD  Preoperative cardiovascular risk assessment.  Left reverse shoulder arthroplasty by Dr. Rennis Chris.  Chart reviewed as part of pre-operative protocol coverage. According to the RCRI, patient has a 0.4% risk of MACE. 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   Given past medical history and time since last visit, based on ACC/AHA guidelines, Victor Barnes would be at acceptable risk for the planned procedure without further cardiovascular testing.   Patient was advised that if he develops new symptoms prior to surgery to contact our office to arrange a follow-up appointment.  he verbalized understanding.  Per Pharm.D. patient may hold Xarelto for 3 days prior to procedure.   Patient will not need bridging with Lovenox (enoxaparin) around procedure.  I will route this recommendation to the requesting party via Epic fax function.  Please call with questions.  Time:   Today, I have spent 5 minutes with the patient with telehealth technology discussing medical history, symptoms, and management plan.     Carlos Levering, NP  03/17/2023, 11:11 AM

## 2023-04-06 ENCOUNTER — Ambulatory Visit: Payer: Medicare Other | Admitting: Vascular Surgery

## 2023-04-06 ENCOUNTER — Ambulatory Visit (HOSPITAL_COMMUNITY): Payer: Medicare Other

## 2023-04-08 NOTE — Patient Instructions (Signed)
SURGICAL WAITING ROOM VISITATION  Patients having surgery or a procedure may have no more than 2 support people in the waiting area - these visitors may rotate.    Children under the age of 44 must have an adult with them who is not the patient.  Due to an increase in RSV and influenza rates and associated hospitalizations, children ages 50 and under may not visit patients in [facility] hospitals.  If the patient needs to stay at the hospital during part of their recovery, the visitor guidelines for inpatient rooms apply. Pre-op nurse will coordinate an appropriate time for 1 support person to accompany patient in pre-op.  This support person may not rotate.    Please refer to the [facility] website for the visitor guidelines for Inpatients (after your surgery is over and you are in a regular room).       Your procedure is scheduled on: 04/21/23   Report to [facility] Main Entrance    Report to admitting a 5:15 AM   Call this number if you have problems the morning of surgery [phone]   Do not eat food :After Midnight.   After Midnight you may have the following liquids until 4:30 AM DAY OF SURGERY  Water Non-Citrus Juices (without pulp, NO RED-Apple, White grape, White cranberry) Black Coffee (NO MILK/CREAM OR CREAMERS, sugar ok)  Clear Tea (NO MILK/CREAM OR CREAMERS, sugar ok) regular and decaf                             Plain Jell-O (NO RED)                                           Fruit ices (not with fruit pulp, NO RED)                                     Popsicles (NO RED)                                                               Sports drinks like Gatorade (NO RED)                The day of surgery:  Drink ONE (1) Pre-Surgery G2 at  4:30 AM the morning of surgery. Drink in one sitting. Do not sip.  This drink was given to you during your hospital  pre-op appointment visit. Nothing else to drink after completing the  Pre-Surgery  G2.   Oral Hygiene  is also important to reduce your risk of infection.                                    Remember - BRUSH YOUR TEETH THE MORNING OF SURGERY WITH YOUR REGULAR TOOTHPASTE  DENTURES WILL BE REMOVED PRIOR TO SURGERY PLEASE DO NOT APPLY "Poly grip" OR ADHESIVES!!!   Stop all vitamins and herbal supplements 7 days before surgery.   Take these medicines the morning of surgery :  Metoprolol, Tamsulosin  DO NOT TAKE ANY ORAL DIABETIC MEDICATIONS DAY OF YOUR SURGERY Hold Ozempic x7 days prior to surgery. Last dose Aug. 25,2024 Take half of insulin dose the day of surgery only if blood sugar >220. If not, don't take any insulin             You may not have any metal on your body including hair pins, jewelry, and body piercing             Do not wear make-up, lotions, powders, perfumes/cologne, or deodorant  Do not wear nail polish including gel and S&S, artificial/acrylic nails, or any other type of covering on natural nails including finger and toenails. If you have artificial nails, gel coating, etc. that needs to be removed by a nail salon please have this removed prior to surgery or surgery may need to be canceled/ delayed if the surgeon/ anesthesia feels like they are unable to be safely monitored.   Do not shave  48 hours prior to surgery.               Men may shave face and neck.   Do not bring valuables to the hospital. [place] IS NOT             RESPONSIBLE   FOR VALUABLES.   Contacts, glasses, dentures or bridgework may not be worn into surgery.   Bring small overnight bag day of surgery.   DO NOT BRING YOUR HOME MEDICATIONS TO THE HOSPITAL. PHARMACY WILL DISPENSE MEDICATIONS LISTED ON YOUR MEDICATION LIST TO YOU DURING YOUR ADMISSION IN THE HOSPITAL!    Patients discharged on the day of surgery will not be allowed to drive home.  Someone NEEDS to stay with you for the first 24 hours after anesthesia.   Special Instructions: Bring a copy of your healthcare power of attorney and  living will documents the day of surgery if you haven't scanned them before.              Please read over the following fact sheets you were given: IF YOU HAVE QUESTIONS ABOUT YOUR PRE-OP INSTRUCTIONS PLEASE CALL [phone] [facility]   If you received a COVID test during your pre-op visit  it is requested that you wear a mask when out in public, stay away from anyone that may not be feeling well and notify your surgeon if you develop symptoms. If you test positive for Covid or have been in contact with anyone that has tested positive in the last 10 days please notify you surgeon.      Pre-operative 5 CHG Bath Instructions   You can play a key role in reducing the risk of infection after surgery. Your skin needs to be as free of germs as possible. You can reduce the number of germs on your skin by washing with CHG (chlorhexidine gluconate) soap before surgery. CHG is an antiseptic soap that kills germs and continues to kill germs even after washing.   DO NOT use if you have an allergy to chlorhexidine/CHG or antibacterial soaps. If your skin becomes reddened or irritated, stop using the CHG and notify one of our RNs at [phone].   Please shower with the CHG soap starting 4 days before surgery using the following schedule:     Please keep in mind the following:  DO NOT shave, including legs and underarms, starting the day of your first shower.   You may shave your face at any point before/day of surgery.  Place clean sheets on your bed the day you start using CHG soap. Use a clean washcloth (not used since being washed) for each shower. DO NOT sleep with pets once you start using the CHG.   CHG Shower Instructions:  If you choose to wash your hair and private area, wash first with your normal shampoo/soap.  After you use shampoo/soap, rinse your hair and body thoroughly to remove shampoo/soap residue.  Turn the water OFF and apply about 3 tablespoons (45 ml) of CHG soap to a CLEAN  washcloth.  Apply CHG soap ONLY FROM YOUR NECK DOWN TO YOUR TOES (washing for 3-5 minutes)  DO NOT use CHG soap on face, private areas, open wounds, or sores.  Pay special attention to the area where your surgery is being performed.  If you are having back surgery, having someone wash your back for you may be helpful. Wait 2 minutes after CHG soap is applied, then you may rinse off the CHG soap.  Pat dry with a clean towel  Put on clean clothes/pajamas   If you choose to wear lotion, please use ONLY the CHG-compatible lotions on the back of this paper.     Additional instructions for the day of surgery: DO NOT APPLY any lotions, deodorants, cologne, or perfumes.   Put on clean/comfortable clothes.  Brush your teeth.  Ask your nurse before applying any prescription medications to the skin.      CHG Compatible Lotions   Aveeno Moisturizing lotion  Cetaphil Moisturizing Cream  Cetaphil Moisturizing Lotion  Clairol Herbal Essence Moisturizing Lotion, Dry Skin  Clairol Herbal Essence Moisturizing Lotion, Extra Dry Skin  Clairol Herbal Essence Moisturizing Lotion, Normal Skin  Curel Age Defying Therapeutic Moisturizing Lotion with Alpha Hydroxy  Curel Extreme Care Body Lotion  Curel Soothing Hands Moisturizing Hand Lotion  Curel Therapeutic Moisturizing Cream, Fragrance-Free  Curel Therapeutic Moisturizing Lotion, Fragrance-Free  Curel Therapeutic Moisturizing Lotion, Original Formula  Eucerin Daily Replenishing Lotion  Eucerin Dry Skin Therapy Plus Alpha Hydroxy Crme  Eucerin Dry Skin Therapy Plus Alpha Hydroxy Lotion  Eucerin Original Crme  Eucerin Original Lotion  Eucerin Plus Crme Eucerin Plus Lotion  Eucerin TriLipid Replenishing Lotion  Keri Anti-Bacterial Hand Lotion  Keri Deep Conditioning Original Lotion Dry Skin Formula Softly Scented  Keri Deep Conditioning Original Lotion, Fragrance Free Sensitive Skin Formula  Keri Lotion Fast Absorbing Fragrance Free Sensitive  Skin Formula  Keri Lotion Fast Absorbing Softly Scented Dry Skin Formula  Keri Original Lotion  Keri Skin Renewal Lotion Keri Silky Smooth Lotion  Keri Silky Smooth Sensitive Skin Lotion  Nivea Body Creamy Conditioning Oil  Nivea Body Extra Enriched Lotion  Nivea Body Original Lotion  Nivea Body Sheer Moisturizing Lotion Nivea Crme  Nivea Skin Firming Lotion  NutraDerm 30 Skin Lotion  NutraDerm Skin Lotion  NutraDerm Therapeutic Skin Cream  NutraDerm Therapeutic Skin Lotion  ProShield Protective Hand Cream  Provon moisturizing lotion [place]- Preparing for Total Shoulder Arthroplasty    Before surgery, you can play an important role. Because skin is not sterile, your skin needs to be as free of germs as possible. You can reduce the number of germs on your skin by using the following products. Benzoyl Peroxide Gel Reduces the number of germs present on the skin Applied twice a day to shoulder area starting two days before surgery    ==================================================================  Please follow these instructions carefully:  BENZOYL PEROXIDE 5% GEL  Please do not use if you have an allergy to benzoyl  peroxide.   If your skin becomes reddened/irritated stop using the benzoyl peroxide.  Starting two days before surgery, apply as follows: Apply benzoyl peroxide in the morning and at night. Apply after taking a shower. If you are not taking a shower clean entire shoulder front, back, and side along with the armpit with a clean wet washcloth.  Place a quarter-sized dollop on your shoulder and rub in thoroughly, making sure to cover the front, back, and side of your shoulder, along with the armpit.   2 days before ____ AM   ____ PM              1 day before ____ AM   ____ PM                         Do this twice a day for two days.  (Last application is the night before surgery, AFTER using the CHG soap as described below).  Do NOT apply benzoyl peroxide gel on  the day of surgery    .Incentive Spirometer  (Watch this video at home: [email or web address])   An incentive spirometer is a tool that can help keep your lungs clear and active. This tool measures how well you are filling your lungs with each breath. Taking long deep breaths may help reverse or decrease the chance of developing breathing (pulmonary) problems (especially infection) following: A long period of time when you are unable to move or be active. BEFORE THE PROCEDURE  If the spirometer includes an indicator to show your best effort, your nurse or respiratory therapist will set it to a desired goal. If possible, sit up straight or lean slightly forward. Try not to slouch. Hold the incentive spirometer in an upright position. INSTRUCTIONS FOR USE  Sit on the edge of your bed if possible, or sit up as far as you can in bed or on a chair. Hold the incentive spirometer in an upright position. Breathe out normally. Place the mouthpiece in your mouth and seal your lips tightly around it. Breathe in slowly and as deeply as possible, raising the piston or the ball toward the top of the column. Hold your breath for 3-5 seconds or for as long as possible. Allow the piston or ball to fall to the bottom of the column. Remove the mouthpiece from your mouth and breathe out normally. Rest for a few seconds and repeat Steps 1 through 7 at least 10 times every 1-2 hours when you are awake. Take your time and take a few normal breaths between deep breaths. The spirometer may include an indicator to show your best effort. Use the indicator as a goal to work toward during each repetition. After each set of 10 deep breaths, practice coughing to be sure your lungs are clear. If you have an incision (the cut made at the time of surgery), support your incision when coughing by placing a pillow or rolled up towels firmly against it. Once you are able to get out of bed, walk around  indoors and cough well. You may stop using the incentive spirometer when instructed by your caregiver.  RISKS AND COMPLICATIONS Take your time so you do not get dizzy or light-headed. If you are in pain, you may need to take or ask for pain medication before doing incentive spirometry. It is harder to take a deep breath if you are having pain. AFTER USE Rest and breathe slowly and easily. It can be helpful to  keep track of a log of your progress. Your caregiver can provide you with a simple table to help with this. If you are using the spirometer at home, follow these instructions: SEEK MEDICAL CARE IF:  You are having difficultly using the spirometer. You have trouble using the spirometer as often as instructed. Your pain medication is not giving enough relief while using the spirometer. You develop fever of 100.5 F (38.1 C) or higher. SEEK IMMEDIATE MEDICAL CARE IF:  You cough up bloody sputum that had not been present before. You develop fever of 102 F (38.9 C) or greater. You develop worsening pain at or near the incision site. MAKE SURE YOU:  Understand these instructions. Will watch your condition. Will get help right away if you are not doing well or get worse. Document Released: 12/13/2006 Document Revised: 10/25/2011 Document Reviewed: 02/13/2007 [facility] Patient Information 2014 [place], [place].

## 2023-04-08 NOTE — Progress Notes (Addendum)
COVID Vaccine received:  []  No [x]  Yes Date of any COVID positive Test in last 90 days: No PCP - Laurann Montana MD Cardiologist - Donato Schultz MD  Cardiac clearance Carlos Levering NP 03/17/23  Chest x-ray -  EKG -  10/28/22 Epic Stress Test -  ECHO - 02/23/23 Epic Cardiac Cath -   Bowel Prep - [x]  No  []   Yes ______  Pacemaker / ICD device [x]  No []  Yes   Spinal Cord Stimulator:[x]  No []  Yes       History of Sleep Apnea? []  No [x]  Yes   CPAP used?- [x]  No []  Yes    Does the patient monitor blood sugar?          []  No [x]  Yes  []  N/A  Patient has: []  NO Hx DM   []  Pre-DM                 []  DM1  [x]   DM2 Does patient have a [employer] or Dexacom? [x]  No []  Yes   Fasting Blood Sugar Ranges- 110-130 Checks Blood Sugar ___30__ times a day  GLP1 agonist / usual dose - Ozempic GLP1 instructions: Last dose 04/10/23 SGLT-2 inhibitors / usual dose -  SGLT-2 instructions:   Blood Thinner / Instructions:Xarelto- Hold for 3 days prior to surgery per Cardiology Lasdt dose to be 04/17/23 Aspirin Instructions:81 mg daily. Stop for 3 days  Comments:   Activity level: Patient is able  to climb a flight of stairs without difficulty; [x]  No CP  []  No SOB, but would have ___   Patient can  perform ADLs without assistance.   Anesthesia review: HTN,DM, DVT/PE, OSA, R BBB, A-fib- on coag.  Patient denies shortness of breath, fever, cough and chest pain at PAT appointment.  Patient verbalized understanding and agreement to the Pre-Surgical Instructions that were given to them at this PAT appointment. Patient was also educated of the need to review these PAT instructions again prior to his/her surgery.I reviewed the appropriate phone numbers to call if they have any and questions or concerns.

## 2023-04-11 ENCOUNTER — Encounter (HOSPITAL_COMMUNITY)
Admission: RE | Admit: 2023-04-11 | Discharge: 2023-04-11 | Disposition: A | Discharge status: Home / Self Care | Payer: Medicare Other | Source: Ambulatory Visit | Attending: Orthopedic Surgery | Admitting: Orthopedic Surgery

## 2023-04-11 ENCOUNTER — Encounter (HOSPITAL_COMMUNITY): Payer: Self-pay

## 2023-04-11 ENCOUNTER — Other Ambulatory Visit: Payer: Self-pay

## 2023-04-11 VITALS — BP 172/90 | HR 102 | Temp 98.2°F | Resp 20 | Ht 72.0 in | Wt 359.0 lb

## 2023-04-11 DIAGNOSIS — E119 Type 2 diabetes mellitus without complications: Secondary | ICD-10-CM | POA: Diagnosis not present

## 2023-04-11 DIAGNOSIS — Z79899 Other long term (current) drug therapy: Secondary | ICD-10-CM | POA: Diagnosis not present

## 2023-04-11 DIAGNOSIS — Z01818 Encounter for other preprocedural examination: Secondary | ICD-10-CM | POA: Diagnosis not present

## 2023-04-11 DIAGNOSIS — I10 Essential (primary) hypertension: Secondary | ICD-10-CM

## 2023-04-11 DIAGNOSIS — M19012 Primary osteoarthritis, left shoulder: Secondary | ICD-10-CM | POA: Insufficient documentation

## 2023-04-11 DIAGNOSIS — Z01812 Encounter for preprocedural laboratory examination: Secondary | ICD-10-CM | POA: Insufficient documentation

## 2023-04-11 DIAGNOSIS — Z794 Long term (current) use of insulin: Secondary | ICD-10-CM | POA: Diagnosis not present

## 2023-04-11 HISTORY — DX: Cardiac arrhythmia, unspecified: I49.9

## 2023-04-11 LAB — BASIC METABOLIC PANEL
Anion gap: 10 (ref 5–15)
BUN: 25 mg/dL — ABNORMAL HIGH (ref 8–23)
CO2: 26 mmol/L (ref 22–32)
Calcium: 9.2 mg/dL (ref 8.9–10.3)
Chloride: 103 mmol/L (ref 98–111)
Creatinine, Ser: 0.69 mg/dL (ref 0.61–1.24)
GFR, Estimated: >60 mL/min (ref >60)
Glucose, Bld: 111 mg/dL — ABNORMAL HIGH (ref 70–99)
Potassium: 4.2 mmol/L (ref 3.5–5.1)
Sodium: 139 mmol/L (ref 135–145)

## 2023-04-11 LAB — CBC
HCT: 45.5 % (ref 39.0–52.0)
Hemoglobin: 14.5 g/dL (ref 13.0–17.0)
MCH: 27.5 pg (ref 26.0–34.0)
MCHC: 31.9 g/dL (ref 30.0–36.0)
MCV: 86.2 fL (ref 80.0–100.0)
Platelets: 212 10*3/uL (ref 150–400)
RBC: 5.28 MIL/uL (ref 4.22–5.81)
RDW: 14.4 % (ref 11.5–15.5)
WBC: 10.5 10*3/uL (ref 4.0–10.5)
nRBC: 0 % (ref 0.0–0.2)

## 2023-04-11 LAB — HEMOGLOBIN A1C
Hgb A1c MFr Bld: 6.6 % — ABNORMAL HIGH (ref 4.8–5.6)
Mean Plasma Glucose: 142.72 mg/dL

## 2023-04-11 LAB — SURGICAL PCR SCREEN
MRSA, PCR: NEGATIVE
Staphylococcus aureus: NEGATIVE

## 2023-04-12 NOTE — Progress Notes (Signed)
Anesthesia Chart Review   Case: [id number] Date/Time: 04/21/23 0715   Procedure: REVERSE SHOULDER ARTHROPLASTY (Left: Shoulder) -   Anesthesia type: General   Pre-op diagnosis: Left shoulder osteoarthritis   Location: [facility] ROOM 06 / [facility]   Surgeons: Francena Hanly, MD       DISCUSSION:67 y.o. former smoker with h/o HTN, DM II, OSA on CPAP, RBBB, a-fib, DVT/PE, left shoulder OA scheduled for above procedure 04/21/2023 with Dr. Francena Hanly.   Per cardiology preoperative evaluation 03/17/2023, "Chart reviewed as part of pre-operative protocol coverage. According to the RCRI, patient has a 0.4% risk of MACE. 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    Given past medical history and time since last visit, based on ACC/AHA guidelines, Victor Barnes would be at acceptable risk for the planned procedure without further cardiovascular testing.    Patient was advised that if he develops new symptoms prior to surgery to contact our office to arrange a follow-up appointment.  he verbalized understanding.   Per Pharm.D. patient may hold Xarelto for 3 days prior to procedure.   Patient will not need bridging with Lovenox (enoxaparin) around procedure."   VS: BP (!) 172/90   Pulse (!) 102   Temp 36.8 C (Oral)   Resp 20   Ht 6' (1.829 m)   Wt (!) 162.8 kg   SpO2 98%   BMI 48.69 kg/m   PROVIDERS: Laurann Montana, MD is PCP   Cardiologist - Donato Schultz MD  LABS: Labs reviewed: Acceptable for surgery. (all labs ordered are listed, but only abnormal results are displayed)  Labs Reviewed  HEMOGLOBIN A1C - Abnormal; Notable for the following components:      Result Value   Hgb A1c MFr Bld 6.6 (*)    All other components within normal limits  BASIC METABOLIC PANEL - Abnormal; Notable for the following components:   Glucose, Bld 111 (*)    BUN 25 (*)    All other components within normal  limits  SURGICAL PCR SCREEN  CBC     IMAGES:   EKG:   CV: Echo 02/23/2023  1. Cannot appreciate all endocardial borders. Left ventricular ejection  fraction, by estimation, is 60 to 65%. The left ventricle has normal  function. The left ventricle has no regional wall motion abnormalities.  There is mild left ventricular  hypertrophy. Left ventricular diastolic parameters are indeterminate.   2. Right ventricular systolic function is normal. The right ventricular  size is normal. Tricuspid regurgitation signal is inadequate for assessing  PA pressure.   3. The mitral valve is grossly normal. No evidence of mitral valve  regurgitation.   4. The aortic valve was not well visualized. Aortic valve regurgitation  is not visualized.   5. There is mild dilatation of the ascending aorta, measuring 42 mm.   6. The inferior vena cava is normal in size with greater than 50%  respiratory variability, suggesting right atrial pressure of 3 mmHg.  Past Medical History:  Diagnosis Date   Atrial fibrillation or flutter    Should be on Coumadin but h/o non-compliance; started Sotalol 08/2009   Cellulitis    Diabetes mellitus    DVT (deep venous thrombosis) (HCC)    Recurrent RLE DVT, supposed to be on Coumadin but h/o non-compliance    Dysrhythmia    Hyperlipidemia    Hypertension    Myocardiopathy (HCC)  OSA on CPAP    Pulmonary embolism (HCC)    Sleep apnea    Venous stasis     Past Surgical History:  Procedure Laterality Date   CATARACT EXTRACTION Bilateral    LEG SURGERY      MEDICATIONS:  Continuous Blood Gluc Sensor (FREESTYLE LIBRE 3 SENSOR) MISC   ezetimibe (ZETIA) 10 MG tablet   furosemide (LASIX) 40 MG tablet   insulin regular (NOVOLIN R,HUMULIN R) 100 units/mL injection   lisinopril (ZESTRIL) 10 MG tablet   metoprolol succinate (TOPROL-XL) 100 MG 24 hr tablet   metoprolol succinate (TOPROL-XL) 50 MG 24 hr tablet   Omega-3 Fatty Acids (FISH OIL PO)   Semaglutide,  1 MG/DOSE, (OZEMPIC, 1 MG/DOSE,) 4 MG/3ML SOPN   tamsulosin (FLOMAX) 0.4 MG CAPS capsule   XARELTO 20 MG TABS tablet   No current facility-administered medications for this encounter.   Jodell Cipro Ward, PA-C [facility]-Surgical Testing [phone]

## 2023-04-14 DIAGNOSIS — M5416 Radiculopathy, lumbar region: Secondary | ICD-10-CM | POA: Diagnosis not present

## 2023-04-20 NOTE — Anesthesia Preprocedure Evaluation (Signed)
Anesthesia Evaluation  Patient identified by MRN, date of birth, ID band Patient awake    Reviewed: Allergy & Precautions, H&P , NPO status , Patient's Chart, lab work & pertinent test results  Airway Mallampati: II  TM Distance: >3 FB Neck ROM: Full    Dental no notable dental hx.    Pulmonary neg pulmonary ROS, sleep apnea , former smoker   Pulmonary exam normal breath sounds clear to auscultation       Cardiovascular hypertension, + dysrhythmias Atrial Fibrillation  Rhythm:Regular Rate:Normal     Neuro/Psych negative neurological ROS  negative psych ROS   GI/Hepatic negative GI ROS, Neg liver ROS,,,  Endo/Other  diabetes    Renal/GU negative Renal ROS  negative genitourinary   Musculoskeletal negative musculoskeletal ROS (+)    Abdominal   Peds negative pediatric ROS (+)  Hematology negative hematology ROS (+)   Anesthesia Other Findings   Reproductive/Obstetrics negative OB ROS                              Anesthesia Physical Anesthesia Plan  ASA: 3  Anesthesia Plan: General and Regional   Post-op Pain Management:    Induction: Intravenous  PONV Risk Score and Plan: Ondansetron and Dexamethasone  Airway Management Planned: Oral ETT  Additional Equipment:   Intra-op Plan:   Post-operative Plan: Extubation in OR  Informed Consent: I have reviewed the patients History and Physical, chart, labs and discussed the procedure including the risks, benefits and alternatives for the proposed anesthesia with the patient or authorized representative who has indicated his/her understanding and acceptance.     Dental advisory given  Plan Discussed with: CRNA  Anesthesia Plan Comments: (67 y.o. y/o male with a h/o permanent A-fib on anticoagulation, hypertension, DVT/PE, T2DM, OSA aortic dilatation stable on last echo July 2024 who is pending left reverse shoulder arthroplasty by  Dr. Rennis Chris and presents today for telephonic preoperative cardiovascular risk assessment.)         Anesthesia Quick Evaluation

## 2023-04-21 ENCOUNTER — Ambulatory Visit (HOSPITAL_COMMUNITY)
Admission: RE | Admit: 2023-04-21 | Discharge: 2023-04-21 | Disposition: A | Discharge status: Home / Self Care | Payer: Medicare Other | Source: Ambulatory Visit | Attending: Orthopedic Surgery | Admitting: Orthopedic Surgery

## 2023-04-21 ENCOUNTER — Encounter (HOSPITAL_COMMUNITY): Payer: Self-pay | Admitting: Orthopedic Surgery

## 2023-04-21 ENCOUNTER — Ambulatory Visit (HOSPITAL_COMMUNITY): Payer: Medicare Other | Admitting: Physician Assistant

## 2023-04-21 ENCOUNTER — Other Ambulatory Visit: Payer: Self-pay

## 2023-04-21 ENCOUNTER — Ambulatory Visit (HOSPITAL_COMMUNITY): Payer: Medicare Other | Admitting: Certified Registered"

## 2023-04-21 ENCOUNTER — Encounter (HOSPITAL_COMMUNITY)
Admission: RE | Disposition: A | Discharge status: Home / Self Care | Payer: Self-pay | Source: Ambulatory Visit | Attending: Orthopedic Surgery

## 2023-04-21 DIAGNOSIS — G4730 Sleep apnea, unspecified: Secondary | ICD-10-CM | POA: Insufficient documentation

## 2023-04-21 DIAGNOSIS — Z87891 Personal history of nicotine dependence: Secondary | ICD-10-CM

## 2023-04-21 DIAGNOSIS — M19012 Primary osteoarthritis, left shoulder: Secondary | ICD-10-CM

## 2023-04-21 DIAGNOSIS — Z794 Long term (current) use of insulin: Secondary | ICD-10-CM | POA: Diagnosis not present

## 2023-04-21 DIAGNOSIS — E119 Type 2 diabetes mellitus without complications: Secondary | ICD-10-CM | POA: Diagnosis not present

## 2023-04-21 DIAGNOSIS — I10 Essential (primary) hypertension: Secondary | ICD-10-CM | POA: Diagnosis not present

## 2023-04-21 DIAGNOSIS — Z833 Family history of diabetes mellitus: Secondary | ICD-10-CM | POA: Diagnosis not present

## 2023-04-21 DIAGNOSIS — G8918 Other acute postprocedural pain: Secondary | ICD-10-CM | POA: Diagnosis not present

## 2023-04-21 DIAGNOSIS — Z7901 Long term (current) use of anticoagulants: Secondary | ICD-10-CM | POA: Insufficient documentation

## 2023-04-21 DIAGNOSIS — Z8249 Family history of ischemic heart disease and other diseases of the circulatory system: Secondary | ICD-10-CM | POA: Insufficient documentation

## 2023-04-21 DIAGNOSIS — M25712 Osteophyte, left shoulder: Secondary | ICD-10-CM | POA: Diagnosis not present

## 2023-04-21 DIAGNOSIS — I4891 Unspecified atrial fibrillation: Secondary | ICD-10-CM | POA: Insufficient documentation

## 2023-04-21 DIAGNOSIS — M19011 Primary osteoarthritis, right shoulder: Secondary | ICD-10-CM | POA: Diagnosis not present

## 2023-04-21 HISTORY — PX: REVERSE SHOULDER ARTHROPLASTY: SHX5054

## 2023-04-21 LAB — GLUCOSE, CAPILLARY
Glucose-Capillary: 94 mg/dL (ref 70–99)
Glucose-Capillary: 136 mg/dL — ABNORMAL HIGH (ref 70–99)

## 2023-04-21 SURGERY — ARTHROPLASTY, SHOULDER, TOTAL, REVERSE
Anesthesia: Regional | Site: Shoulder | Laterality: Left

## 2023-04-21 MED ORDER — TRANEXAMIC ACID-NACL 1000-0.7 MG/100ML-% IV SOLN
1000.0000 mg | INTRAVENOUS | Status: AC
  Administered 2023-04-21: 1000 mg via INTRAVENOUS
  Filled 2023-04-21: qty 100

## 2023-04-21 MED ORDER — ONDANSETRON HCL 4 MG/2ML IJ SOLN
INTRAMUSCULAR | Status: AC
  Filled 2023-04-21: qty 2

## 2023-04-21 MED ORDER — CYCLOBENZAPRINE HCL 10 MG PO TABS: 10.0000 mg | ORAL_TABLET | Freq: Three times a day (TID) | ORAL | 1 refills | Status: DC | PRN

## 2023-04-21 MED ORDER — FENTANYL CITRATE (PF) 100 MCG/2ML IJ SOLN
INTRAMUSCULAR | Status: AC
  Filled 2023-04-21: qty 2

## 2023-04-21 MED ORDER — DEXAMETHASONE SODIUM PHOSPHATE 10 MG/ML IJ SOLN
INTRAMUSCULAR | Status: DC | PRN
  Administered 2023-04-21: 4 mg via INTRAVENOUS

## 2023-04-21 MED ORDER — MIDAZOLAM HCL 2 MG/2ML IJ SOLN
INTRAMUSCULAR | Status: AC
  Filled 2023-04-21: qty 2

## 2023-04-21 MED ORDER — VANCOMYCIN HCL 1000 MG IV SOLR
INTRAVENOUS | Status: DC | PRN
Start: 2023-04-21 — End: 2023-04-21
  Administered 2023-04-21: 1000 mg via TOPICAL

## 2023-04-21 MED ORDER — ACETAMINOPHEN 10 MG/ML IV SOLN: 1000.0000 mg | Freq: Once | INTRAVENOUS | Status: DC | PRN

## 2023-04-21 MED ORDER — LACTATED RINGERS IV SOLN: INTRAVENOUS | Status: DC

## 2023-04-21 MED ORDER — PROPOFOL 10 MG/ML IV BOLUS
INTRAVENOUS | Status: AC
  Filled 2023-04-21: qty 20

## 2023-04-21 MED ORDER — BUPIVACAINE LIPOSOME 1.3 % IJ SUSP
INTRAMUSCULAR | Status: DC | PRN
Start: 2023-04-21 — End: 2023-04-21
  Administered 2023-04-21: 10 mL

## 2023-04-21 MED ORDER — INSULIN ASPART 100 UNIT/ML IJ SOLN: 0.0000 [IU] | INTRAMUSCULAR | Status: DC | PRN

## 2023-04-21 MED ORDER — SUGAMMADEX SODIUM 200 MG/2ML IV SOLN
INTRAVENOUS | Status: DC | PRN
  Administered 2023-04-21: 400 mg via INTRAVENOUS

## 2023-04-21 MED ORDER — LIDOCAINE 2% (20 MG/ML) 5 ML SYRINGE
INTRAMUSCULAR | Status: DC | PRN
  Administered 2023-04-21: 60 mg via INTRAVENOUS

## 2023-04-21 MED ORDER — ROCURONIUM BROMIDE 10 MG/ML (PF) SYRINGE
PREFILLED_SYRINGE | INTRAVENOUS | Status: DC | PRN
  Administered 2023-04-21: 80 mg via INTRAVENOUS
  Administered 2023-04-21 (×2): 10 mg via INTRAVENOUS

## 2023-04-21 MED ORDER — OXYCODONE-ACETAMINOPHEN 5-325 MG PO TABS
1.0000 | ORAL_TABLET | ORAL | 0 refills | Status: DC | PRN
Start: 2023-04-21 — End: 2023-05-07

## 2023-04-21 MED ORDER — MIDAZOLAM HCL 2 MG/2ML IJ SOLN
INTRAMUSCULAR | Status: DC | PRN
  Administered 2023-04-21: 2 mg via INTRAVENOUS

## 2023-04-21 MED ORDER — LIDOCAINE HCL (PF) 2 % IJ SOLN
INTRAMUSCULAR | Status: AC
  Filled 2023-04-21: qty 10

## 2023-04-21 MED ORDER — VANCOMYCIN HCL 1000 MG IV SOLR
INTRAVENOUS | Status: AC
  Filled 2023-04-21: qty 20

## 2023-04-21 MED ORDER — EPHEDRINE 5 MG/ML INJ
INTRAVENOUS | Status: AC
  Filled 2023-04-21: qty 5

## 2023-04-21 MED ORDER — EPHEDRINE SULFATE-NACL 50-0.9 MG/10ML-% IV SOSY
PREFILLED_SYRINGE | INTRAVENOUS | Status: DC | PRN
  Administered 2023-04-21 (×4): 5 mg via INTRAVENOUS

## 2023-04-21 MED ORDER — DROPERIDOL 2.5 MG/ML IJ SOLN: 0.6250 mg | Freq: Once | INTRAMUSCULAR | Status: DC | PRN

## 2023-04-21 MED ORDER — BUPIVACAINE HCL (PF) 0.5 % IJ SOLN
INTRAMUSCULAR | Status: DC | PRN
Start: 2023-04-21 — End: 2023-04-21
  Administered 2023-04-21: 15 mL

## 2023-04-21 MED ORDER — CEFAZOLIN IN SODIUM CHLORIDE 3-0.9 GM/100ML-% IV SOLN
3.0000 g | INTRAVENOUS | Status: AC
  Administered 2023-04-21: 3 g via INTRAVENOUS
  Filled 2023-04-21: qty 100

## 2023-04-21 MED ORDER — DEXAMETHASONE SODIUM PHOSPHATE 10 MG/ML IJ SOLN
INTRAMUSCULAR | Status: AC
  Filled 2023-04-21: qty 1

## 2023-04-21 MED ORDER — OXYCODONE HCL 5 MG/5ML PO SOLN: 5.0000 mg | Freq: Once | ORAL | Status: DC | PRN

## 2023-04-21 MED ORDER — CEFAZOLIN SODIUM-DEXTROSE 2-4 GM/100ML-% IV SOLN
INTRAVENOUS | Status: AC
  Filled 2023-04-21: qty 100

## 2023-04-21 MED ORDER — FENTANYL CITRATE (PF) 100 MCG/2ML IJ SOLN
INTRAMUSCULAR | Status: DC | PRN
  Administered 2023-04-21 (×2): 50 ug via INTRAVENOUS

## 2023-04-21 MED ORDER — ONDANSETRON HCL 4 MG PO TABS: 4.0000 mg | ORAL_TABLET | Freq: Three times a day (TID) | ORAL | 0 refills | Status: DC | PRN

## 2023-04-21 MED ORDER — FENTANYL CITRATE PF 50 MCG/ML IJ SOSY: 25.0000 ug | PREFILLED_SYRINGE | INTRAMUSCULAR | Status: DC | PRN

## 2023-04-21 MED ORDER — ROCURONIUM BROMIDE 10 MG/ML (PF) SYRINGE
PREFILLED_SYRINGE | INTRAVENOUS | Status: AC
  Filled 2023-04-21: qty 10

## 2023-04-21 MED ORDER — 0.9 % SODIUM CHLORIDE (POUR BTL) OPTIME
TOPICAL | Status: DC | PRN
  Administered 2023-04-21: 1000 mL

## 2023-04-21 MED ORDER — PHENYLEPHRINE 80 MCG/ML (10ML) SYRINGE FOR IV PUSH (FOR BLOOD PRESSURE SUPPORT): PREFILLED_SYRINGE | INTRAVENOUS | Status: DC | PRN

## 2023-04-21 MED ORDER — STERILE WATER FOR IRRIGATION IR SOLN
Status: DC | PRN
  Administered 2023-04-21: 2000 mL

## 2023-04-21 MED ORDER — ORAL CARE MOUTH RINSE: 15.0000 mL | Freq: Once | OROMUCOSAL | Status: AC

## 2023-04-21 MED ORDER — CHLORHEXIDINE GLUCONATE 0.12 % MT SOLN
15.0000 mL | Freq: Once | OROMUCOSAL | Status: AC
  Administered 2023-04-21: 15 mL via OROMUCOSAL

## 2023-04-21 MED ORDER — ONDANSETRON HCL 4 MG/2ML IJ SOLN
INTRAMUSCULAR | Status: DC | PRN
  Administered 2023-04-21: 4 mg via INTRAVENOUS

## 2023-04-21 MED ORDER — SUCCINYLCHOLINE CHLORIDE 200 MG/10ML IV SOSY
PREFILLED_SYRINGE | INTRAVENOUS | Status: AC
  Filled 2023-04-21: qty 10

## 2023-04-21 MED ORDER — PHENYLEPHRINE HCL-NACL 20-0.9 MG/250ML-% IV SOLN
INTRAVENOUS | Status: DC | PRN
  Administered 2023-04-21: 20 ug/min via INTRAVENOUS

## 2023-04-21 MED ORDER — TRANEXAMIC ACID 1000 MG/10ML IV SOLN: 1000.0000 mg | INTRAVENOUS | Status: DC

## 2023-04-21 MED ORDER — OXYCODONE HCL 5 MG PO TABS: 5.0000 mg | ORAL_TABLET | Freq: Once | ORAL | Status: DC | PRN

## 2023-04-21 MED ORDER — PROPOFOL 10 MG/ML IV BOLUS
INTRAVENOUS | Status: DC | PRN
  Administered 2023-04-21: 150 mg via INTRAVENOUS

## 2023-04-21 SURGICAL SUPPLY — 72 items
ADH SKN CLS APL DERMABOND .7 (GAUZE/BANDAGES/DRESSINGS) ×1
AID PSTN UNV HD RSTRNT DISP (MISCELLANEOUS) ×1
BAG COUNTER SPONGE SURGICOUNT (BAG) IMPLANT
BAG SPEC THK2 15X12 ZIP CLS (MISCELLANEOUS) ×1
BAG SPNG CNTER NS LX DISP (BAG)
BAG ZIPLOCK 12X15 (MISCELLANEOUS) ×1 IMPLANT
BASEPLATE AUG FULL 24 20D (Plate) IMPLANT
BIT DRILL AR 3 NS (BIT) IMPLANT
BLADE SAW SGTL 83.5X18.5 (BLADE) ×1 IMPLANT
BNDG CMPR 5X4 CHSV STRCH STRL (GAUZE/BANDAGES/DRESSINGS) ×1
BNDG COHESIVE 4X5 TAN STRL LF (GAUZE/BANDAGES/DRESSINGS) ×1 IMPLANT
BSPLAT GLND 20D OBLQ 24 FULL (Plate) ×1 IMPLANT
CALIBRATOR GLENOID VIP 5-D (SYSTAGENIX WOUND MANAGEMENT) IMPLANT
COOLER ICEMAN CLASSIC (MISCELLANEOUS) ×1 IMPLANT
COVER BACK TABLE 60X90IN (DRAPES) ×1 IMPLANT
COVER SURGICAL LIGHT HANDLE (MISCELLANEOUS) ×1 IMPLANT
CUP SUT UNIV REVERS 42 NEUT (Shoulder) IMPLANT
DERMABOND ADVANCED .7 DNX12 (GAUZE/BANDAGES/DRESSINGS) ×1 IMPLANT
DRAPE ORTHO SPLIT 77X108 STRL (DRAPES) ×2
DRAPE SHEET LG 3/4 BI-LAMINATE (DRAPES) ×1 IMPLANT
DRAPE SURG 17X11 SM STRL (DRAPES) ×1 IMPLANT
DRAPE SURG ORHT 6 SPLT 77X108 (DRAPES) ×2 IMPLANT
DRAPE TOP 10253 STERILE (DRAPES) ×1 IMPLANT
DRAPE U-SHAPE 47X51 STRL (DRAPES) ×1 IMPLANT
DRESSING AQUACEL AG SP 3.5X6 (GAUZE/BANDAGES/DRESSINGS) ×1 IMPLANT
DRSG AQUACEL AG ADV 3.5X10 (GAUZE/BANDAGES/DRESSINGS) IMPLANT
DRSG AQUACEL AG SP 3.5X6 (GAUZE/BANDAGES/DRESSINGS) ×1
DURAPREP 26ML APPLICATOR (WOUND CARE) ×1 IMPLANT
ELECT BLADE TIP CTD 4 INCH (ELECTRODE) ×1 IMPLANT
ELECT PENCIL ROCKER SW 15FT (MISCELLANEOUS) ×1 IMPLANT
ELECT REM PT RETURN 15FT ADLT (MISCELLANEOUS) ×1 IMPLANT
FACESHIELD WRAPAROUND (MASK) ×5
FACESHIELD WRAPAROUND OR TEAM (MASK) ×5 IMPLANT
GLENOID SYS 42 +4 LAT/24 SHLDR (Miscellaneous) IMPLANT
GLOVE BIO SURGEON STRL SZ7.5 (GLOVE) ×1 IMPLANT
GLOVE BIO SURGEON STRL SZ8 (GLOVE) ×1 IMPLANT
GLOVE SS BIOGEL STRL SZ 7 (GLOVE) ×1 IMPLANT
GLOVE SS BIOGEL STRL SZ 7.5 (GLOVE) ×1 IMPLANT
GOWN STRL SURGICAL XL XLNG (GOWN DISPOSABLE) ×2 IMPLANT
INSERT HUM CONST L/42 +3 (Insert) IMPLANT
KIT BASIN OR (CUSTOM PROCEDURE TRAY) ×1 IMPLANT
KIT TURNOVER KIT A (KITS) IMPLANT
MANIFOLD NEPTUNE II (INSTRUMENTS) ×1 IMPLANT
NDL TAPERED W/ NITINOL LOOP (MISCELLANEOUS) ×1 IMPLANT
NEEDLE TAPERED W/ NITINOL LOOP (MISCELLANEOUS) ×1
NS IRRIG 1000ML POUR BTL (IV SOLUTION) ×1 IMPLANT
PACK SHOULDER (CUSTOM PROCEDURE TRAY) ×1 IMPLANT
PAD ARMBOARD 7.5X6 YLW CONV (MISCELLANEOUS) ×1 IMPLANT
PAD COLD SHLDR WRAP-ON (PAD) ×1 IMPLANT
PIN NITINOL TARGETER 2.8 (PIN) IMPLANT
PIN SET MODULAR GLENOID SYSTEM (PIN) IMPLANT
POST MODULAR 35 (Post) IMPLANT
REAMER ANGLED HEAD SMALL (DRILL) IMPLANT
RESTRAINT HEAD UNIVERSAL NS (MISCELLANEOUS) ×1 IMPLANT
SCREW PERI LOCK 5.5X24 (Screw) IMPLANT
SCREW PERI NL (Screw) IMPLANT
SCREW PERIPHERAL 5.5X40 LOCK (Screw) IMPLANT
SLING ARM FOAM STRAP LRG (SOFTGOODS) IMPLANT
SLING ARM FOAM STRAP MED (SOFTGOODS) IMPLANT
STEM HUM UNIV REV SZ13 (Stem) IMPLANT
SUT MNCRL AB 3-0 PS2 18 (SUTURE) ×1 IMPLANT
SUT MON AB 2-0 CT1 36 (SUTURE) ×1 IMPLANT
SUT VIC AB 1 CT1 36 (SUTURE) ×1 IMPLANT
SUT VIC AB 2-0 CT1 27 (SUTURE) ×1
SUT VIC AB 2-0 CT1 TAPERPNT 27 (SUTURE) IMPLANT
SUTURE TAPE 1.3 40 TPR END (SUTURE) ×2 IMPLANT
SUTURETAPE 1.3 40 TPR END (SUTURE) ×2
TOWEL OR 17X26 10 PK STRL BLUE (TOWEL DISPOSABLE) ×1 IMPLANT
TOWEL OR NON WOVEN STRL DISP B (DISPOSABLE) ×1 IMPLANT
TUBE SUCTION HIGH CAP CLEAR NV (SUCTIONS) ×1 IMPLANT
TUBING CONNECTING 10 (TUBING) ×1 IMPLANT
WATER STERILE IRR 1000ML POUR (IV SOLUTION) ×2 IMPLANT

## 2023-04-21 NOTE — Anesthesia Procedure Notes (Signed)
Anesthesia Regional Block: Interscalene brachial plexus block   Pre-Anesthetic Checklist: , timeout performed,  Correct Patient, Correct Site, Correct Laterality,  Correct Procedure, Correct Position, site marked,  Risks and benefits discussed,  Surgical consent,  Pre-op evaluation,  At surgeon's request and post-op pain management  Laterality: Left  Prep: chloraprep       Needles:  Injection technique: Single-shot  Needle Type: Echogenic Stimulator Needle     Needle Length: 9cm  Needle Gauge: 21     Additional Needles:   Procedures:,,,, ultrasound used (permanent image in chart),,    Narrative:  Start time: 04/21/2023 6:45 AM End time: 04/21/2023 6:50 AM Injection made incrementally with aspirations every 5 mL.  Performed by: Personally  Anesthesiologist: Tensed Nation, MD  Additional Notes: Discussed risks and benefits of the nerve block in detail, including but not limited vascular injury, permanent nerve damage and infection.   Patient tolerated the procedure well. Local anesthetic introduced in an incremental fashion under minimal resistance after negative aspirations. No paresthesias were elicited. After completion of the procedure, no acute issues were identified and patient continued to be monitored by RN.

## 2023-04-21 NOTE — Op Note (Signed)
04/21/2023  9:52 AM  PATIENT:   Victor Barnes  67 y.o. male  PRE-OPERATIVE DIAGNOSIS:  Left shoulder osteoarthritis with associated significant bony deformity including glenoid retroversion and excessive superior inclination  POST-OPERATIVE DIAGNOSIS: Same  PROCEDURE: Left shoulder reverse arthroplasty utilizing a 20 degree augmented glenoid baseplate with a 42 glenosphere, size 13 press-fit humeral stem with a neutral metaphysis, +3 constrained polyethylene insert  SURGEON:  Latoiya Maradiaga, Vania Rea M.D.  ASSISTANTS: Ralene Bathe, PA-C  Ralene Bathe, PA-C was utilized as an Geophysicist/field seismologist throughout this case, essential for help with positioning the patient, positioning extremity, tissue manipulation, implantation of the prosthesis, suture management, wound closure, and intraoperative decision-making.  ANESTHESIA:   General Endotracheal and interscalene block with Exparel rel  EBL: 300 cc  SPECIMEN: None  Drains: None   PATIENT DISPOSITION:  PACU - hemodynamically stable.    PLAN OF CARE: Discharge to home after PACU  Brief history:  Victor Barnes is a 67 year old gentleman with chronic and progressively increasing left shoulder pain related to severe osteoarthritis with associated significant deformity of the glenoid.  Due to his increasing pain and functional rotations and failure to respond to prolonged attempts of conservative management, he is brought to the operating this time for planned left shoulder reverse arthroplasty.  Preoperatively, I counseled the patient regarding treatment options and risks versus benefits thereof.  Possible surgical complications were all reviewed including potential for bleeding, infection, neurovascular injury, persistent pain, loss of motion, anesthetic complication, failure of the implant, and possible need for additional surgery. They understand and accept and agrees with our planned procedure.   Procedure detail:  After undergoing routine preop  evaluation the patient received prophylactic antibiotics and interscalene block with Exparel was established in the holding area by the anesthesia department.  Subsequently placed spine on the operating table and underwent the smooth induction of the general endotracheal anesthesia.  Placed into the beachchair position and appropriately padded and protected.  The left shoulder girdle region was sterilely prepped and draped in standard fashion.  Timeout was called.  A deltopectoral approach left shoulder was made through an approximately 12 cm incision.  Flaps elevated dissection carried deeply deltopectoral interval was then developed from proximal to distal with the vein taken laterally.  There was significant abnormalities of the cephalic vein distally with collaterals and ultimately this area was ligated with suture ties.  Adhesions were then divided beneath the deltoid and the conjoined tendon was mobilized and retracted medially.  The long head bicep tendon was then tenodesed at the upper border the pectoralis major tendon with the proximal segment unroofed and excised.  The subscapularis was then separated from the lesser tuberosity using electrocautery and the rotator interval was then split to the base of the coracoid and the subscap was then tagged with a pair of grasping suture tape sutures.  Capsular attachments were then divided from the anterior and inferior margins of the humeral neck and the humeral head was then delivered through the wound.  An extra medullary guide was then used to outline the proposed humeral head resection which we performed with an oscillating saw at approximately 20 degrees of retroversion.  Marginal osteophytes were removed with a rondure and a metal cap was then placed over the cut proximal humeral surface.  The glenoid was then exposed and a circumferential labral resection was performed.  We then utilized the Arthrex alignment guide based on the preoperative CT scan for  correction of the glenoid deformity guidepin was then introduced and  confirmed to be appropriately positioned much to our satisfaction.  The glenoid was then reamed with a 20 degree reamer allowing for minimal bone removal.  Preparation completed with the central drill for a 35 mm post.  A baseplate was then assembled and was impacted with excellent fixation allowing correction of the retroversion and superior inclination.  An initial inferior nonlocking screw was placed with the baseplate and the balance of the peripheral screws were fixated with locking screws achieving excellent fixation.  A 42 glenosphere was then impacted onto the baseplate and a central locking screw was placed.  We returned attention back to the humeral canal where we ultimately broached up to a size 13 stem at approximate 20 degrees of retroversion.  A neutral metaphyseal reaming guide was then placed.  Trial implant was placed showing good motion stability and soft tissue balance.  Trial was then removed.  The final implant was then assembled.  The canal was irrigated cleaned and dried and vancomycin powder sprayed into the humeral canal.  The final implant was then seated with excellent fixation.  Trial reduction showed excellent soft tissue balance with a +3 poly.  Given the patient's large body habitus we utilized a +3 constrained poly insert which was impacted on the implant and a final reduction showed excellent motion stability and soft tissue balance all much to our satisfaction.  The wound was then copious irrigated.  We mobilized the subscapularis and confirmed good elasticity.  The subscap was repaired back to the eyelets on the collar the implant.  The deltopectoral interval was then reapproximated the series of figure-of-eight number Vicryl sutures.  2-0 Monocryl used to close the subcu layer and intracuticular 3-0 Monocryl used to close the skin followed by Dermabond and Aquacel dressing.  The left arm was placed into a sling.   The patient was awakened, extubated, and taken to the recovery room in stable condition.  Senaida Lange MD   Contact # [phone]

## 2023-04-21 NOTE — Transfer of Care (Signed)
Immediate Anesthesia Transfer of Care Note  Patient: Victor Barnes  Procedure(s) Performed: REVERSE SHOULDER ARTHROPLASTY (Left: Shoulder)  Patient Location: PACU  Anesthesia Type:General  Level of Consciousness: awake, alert , and patient cooperative  Airway & Oxygen Therapy: Patient Spontanous Breathing and Patient connected to face mask oxygen  Post-op Assessment: Report given to RN and Post -op Vital signs reviewed and stable  Post vital signs: Reviewed and stable  Last Vitals:  Vitals Value Taken Time  BP 119/68 04/21/23 1000  Temp    Pulse 75 04/21/23 1000  Resp 22 04/21/23 1000  SpO2 98 % 04/21/23 1000  Vitals shown include unfiled device data.  Last Pain:  Vitals:   04/21/23 0634  TempSrc: Oral  PainSc:          Complications: No notable events documented.

## 2023-04-21 NOTE — Evaluation (Signed)
Occupational Therapy Evaluation Patient Details Name: ARO BARANOWSKI MRN: [id number] DOB: [birth year] Today's Date: 04/21/2023   History of Present Illness 67 y.o. male s/p left reverse shoulder arthroplasty 04/21/23   Clinical Impression    s/p reverse shoulder arthroplasty without functional use of LUE secondary to effects of surgery and interscalene block and shoulder precautions. Therapist provided education and instruction to patient and spouse in regards to exercises, precautions, positioning, donning upper extremity clothing and bathing while maintaining shoulder precautions, ice and edema management, use of ice machine and donning/doffing sling. Patient and spouse verbalized understanding and demonstrated as needed. Patient needed assistance to donn shirt, underwear, pants, and socks and provided with instruction on compensatory strategies to perform ADLs. Patient to follow up with MD for further therapy needs.       If plan is discharge home, recommend the following: A little help with bathing/dressing/bathroom;Assist for transportation;Assistance with cooking/housework    Functional Status Assessment  Patient has had a recent decline in their functional status and demonstrates the ability to make significant improvements in function in a reasonable and predictable amount of time.  Equipment Recommendations  None recommended by OT       Precautions / Restrictions Precautions Precautions: Shoulder Type of Shoulder Precautions: post op shoulder procotol/precautions. Sling 24/7. NWB status, OK A/ROM elbow, wrist, and hand and shoulder Lap slides and pendulums Shoulder Interventions: Shoulder sling/immobilizer;At all times;Off for dressing/bathing/exercises Precaution Booklet Issued: Yes (comment) Precaution Comments: Verbal instruction provided to patient and wife regarding shoulder precautions. Handout provided as reference. Required Braces or Orthoses: Sling Restrictions Weight  Bearing Restrictions: Yes LUE Weight Bearing: Non weight bearing      Mobility Bed Mobility Overal bed mobility:  (up in recliner upon therapy arrival)     Transfers Overall transfer level: Independent Equipment used: None         Balance Overall balance assessment: No apparent balance deficits (not formally assessed)         ADL either performed or assessed with clinical judgement   ADL    General ADL Comments: Due to recent shoulder surgery, pt will require Min-mod assist with ADL tasks such as bathing, dressing, toileting, and grooming. Wife is able to provide assistance as needed.     Vision Baseline Vision/History: 0 No visual deficits Ability to See in Adequate Light: 0 Adequate Patient Visual Report: No change from baseline Vision Assessment?: No apparent visual deficits     Perception Perception: Not tested       Praxis Praxis: Not tested       Pertinent Vitals/Pain Pain Assessment Pain Assessment: No/denies pain (Nerve block still in effect. Reports discomfort under his arm due to elastic strap on Ice man machine.)     Extremity/Trunk Assessment Upper Extremity Assessment Upper Extremity Assessment: Left hand dominant;LUE deficits/detail LUE Deficits / Details: post op shoulder surgery with precautions in place. Nerve block still in effect. Able to assist with elbow ROM during UB dressing. LUE: Unable to fully assess due to immobilization LUE Coordination: decreased fine motor;decreased gross motor   Lower Extremity Assessment Lower Extremity Assessment: Overall WFL for tasks assessed   Cervical / Trunk Assessment Cervical / Trunk Assessment: Other exceptions Cervical / Trunk Exceptions: body habitus, rounded shoulders, posterior pelvic tilt   Communication Communication Communication: No apparent difficulties   Cognition Arousal: Alert Behavior During Therapy: WFL for tasks assessed/performed Overall Cognitive Status: Within Functional Limits  for tasks assessed      General Comments  surgical dressing in place  on left shoulder. Post surgical swelling noted.            Home Living Family/patient expects to be discharged to:: Private residence Living Arrangements: Spouse/significant other Available Help at Discharge: Family;Available 24 hours/day          Prior Functioning/Environment Prior Level of Function : Independent/Modified Independent           OT Problem List: Impaired UE functional use         OT Goals(Current goals can be found in the care plan section) Acute Rehab OT Goals Patient Stated Goal: to go home  OT Frequency:  1X visit       AM-PAC OT "6 Clicks" Daily Activity     Outcome Measure Help from another person eating meals?: A Little Help from another person taking care of personal grooming?: A Little Help from another person toileting, which includes using toliet, bedpan, or urinal?: A Little Help from another person bathing (including washing, rinsing, drying)?: A Lot Help from another person to put on and taking off regular upper body clothing?: A Lot Help from another person to put on and taking off regular lower body clothing?: A Lot 6 Click Score: 15   End of Session Equipment Utilized During Treatment: Other (comment) (sling) Nurse Communication: Other (comment) (ready for D/C)  Activity Tolerance: Patient tolerated treatment well Patient left: in chair;with family/visitor present  OT Visit Diagnosis: Muscle weakness (generalized) (M62.81)                Time: 1610-9604 OT Time Calculation (min): 22 min Charges:  OT General Charges $OT Visit: 1 Visit OT Evaluation $OT Eval Moderate Complexity: 1 Mod  [employer], OTR/L,CBIS  Supplemental OT - [facility] and [facility] Secure Chat Preferred    Samer Dutton, Charisse March 04/21/2023, 12:51 PM

## 2023-04-21 NOTE — Discharge Instructions (Signed)
? ?Victor Barnes, M.D., F.A.A.O.S. ?Orthopaedic Surgery ?Specializing in Arthroscopic and Reconstructive ?Surgery of the Shoulder ?[phone] ?[address] - [place],  [address] - Fax [phone] ? ? ?POST-OP TOTAL SHOULDER REPLACEMENT INSTRUCTIONS ? ?1. Follow up in the office for your first post-op appointment 10-14 days from the date of your surgery. If you do not already have a scheduled appointment, our office will contact you to schedule. ? ?2. The bandage over your incision is waterproof. You may begin showering with this dressing on. You may leave this dressing on until first follow up appointment within 2 weeks. We prefer you leave this dressing in place until follow up however after 5-7 days if you are having itching or skin irritation and would like to remove it you may do so. Go slow and tug at the borders gently to break the bond the dressing has with the skin. At this point if there is no drainage it is okay to go without a bandage or you may cover it with a light guaze and tape. You can also expect significant bruising around your shoulder that will drift down your arm and into your chest wall. This is very normal and should resolve over several days. ? ? 3. Wear your sling/immobilizer at all times except to perform the exercises below or to occasionally let your arm dangle by your side to stretch your elbow. You also need to sleep in your sling immobilizer until instructed otherwise. It is ok to remove your sling if you are sitting in a controlled environment and allow your arm to rest in a position of comfort by your side or on your lap with pillows to give your neck and skin a break from the sling. You may remove it to allow arm to dangle by side to shower. If you are up walking around and when you go to sleep at night you need to wear it. ? ?4. Range of motion to your elbow, wrist, and hand are encouraged 3-5 times daily. Exercise to your hand and fingers helps to reduce  swelling you may experience. ? ? ?5. Prescriptions for a pain medication and a muscle relaxant are provided for you. It is recommended that if you are experiencing pain that you pain medication alone is not controlling, add the muscle relaxant along with the pain medication which can give additional pain relief. The first 1-2 days is generally the most severe of your pain and then should gradually decrease. As your pain lessens it is recommended that you decrease your use of the pain medications to an "as needed basis'" only and to always comply with the recommended dosages of the pain medications. ? ?6. Pain medications can produce constipation along with their use. If you experience this, the use of an over the counter stool softener or laxative daily is recommended.  ? ?7. For additional questions or concerns, please do not hesitate to call the office. If after hours there is an answering service to forward your concerns to the physician on call. ? ?8.Pain control following an exparel block ? ?To help control your post-operative pain you received a nerve block  performed with Exparel which is a long acting anesthetic (numbing agent) which can provide pain relief and sensations of numbness (and relief of pain) in the operative shoulder and arm for up to 3 days. Sometimes it provides mixed relief, meaning you may still have numbness in certain areas of the arm but can still be able to   move  parts of that arm, hand, and fingers. We recommend that your prescribed pain medications  be used as needed. We do not feel it is necessary to "pre medicate" and "stay ahead" of pain.  Taking narcotic pain medications when you are not having any pain can lead to unnecessary and potentially dangerous side effects.   ? ?9. Use the ice machine as much as possible in the first 5-7 days from surgery, then you can wean its use to as needed. The ice typically needs to be replaced every 6 hours, instead of ice you can actually freeze  water bottles to put in the cooler and then fill water around them to avoid having to purchase ice. You can have spare water bottles freezing to allow you to rotate them once they have melted. Try to have a thin shirt or light cloth or towel under the ice wrap to protect your skin.  ? ?FOR ADDITIONAL INFO ON ICE MACHINE AND INSTRUCTIONS GO TO THE WEBSITE AT ? ?https://www.[employer].com/products/donjoy/donjoy-iceman-classic3 ? ?10.  We recommend that you avoid any dental work or cleaning in the first 3 months following your joint replacement. This is to help minimize the possibility of infection from the bacteria in your mouth that enters your bloodstream during dental work. We also recommend that you take an antibiotic prior to your dental work for the first year after your shoulder replacement to further help reduce that risk. Please simply contact our office for antibiotics to be sent to your pharmacy prior to dental work. ? ?11. Dental Antibiotics: ? ?We recommend waiting at least 3 months for any dental work even cleanings unless there is a dental emergency. We also recommend  prophylactic antibiotics for all dental procdeures  the first year following your joint replacement. In some exceptions we recommend them to be used lifelong. We will provide you with that prescription in follow up office visits, or you can call our office. ? ?Exceptions are as follows: ? ?1. History of prior total joint infection ? ?2. Severely immunocompromised (Organ Transplant, cancer chemotherapy, Rheumatoid biologic ?meds such as Humera) ? ?3. Poorly controlled diabetes (A1C &gt; 8.0, blood glucose over 200) ? ? ?POST-OP EXERCISES ? ?Pendulum Exercises ? ?Perform pendulum exercises while standing and bending at the waist. Support your uninvolved arm on a table or chair and allow your operated arm to hang freely. Make sure to do these exercises passively - not using you shoulder muscles. These exercises can be performed once your  nerve block effects have worn off. ? ?Repeat 20 times. Do 3 sessions per day. ? ? ?  ?

## 2023-04-21 NOTE — Anesthesia Postprocedure Evaluation (Signed)
Anesthesia Post Note  Patient: Victor Barnes  Procedure(s) Performed: REVERSE SHOULDER ARTHROPLASTY (Left: Shoulder)     Patient location during evaluation: PACU Anesthesia Type: Regional Level of consciousness: awake and alert Pain management: pain level controlled Vital Signs Assessment: post-procedure vital signs reviewed and stable Respiratory status: spontaneous breathing, nonlabored ventilation, respiratory function stable and patient connected to nasal cannula oxygen Cardiovascular status: blood pressure returned to baseline and stable Postop Assessment: no apparent nausea or vomiting Anesthetic complications: no   No notable events documented.  Last Vitals:  Vitals:   04/21/23 1056 04/21/23 1156  BP: (!) 140/86 133/78  Pulse: 66 69  Resp: 15 19  Temp: 36.4 C   SpO2: 93% 93%    Last Pain:  Vitals:   04/21/23 1156  TempSrc:   PainSc: 0-No pain                 [place]

## 2023-04-21 NOTE — H&P (Signed)
[facility]    Chief Complaint: Left shoulder osteoarthritis HPI: The patient is a 67 y.o. male with chronic and progressively increasing left shoulder pain related to severe osteoarthritis with significant bony deformity.  Due to his increasing functional limitations and failure to respond to prolonged attempts of conservative management, he is brought to the operating this time for planned left shoulder reverse arthroplasty  Past Medical History:  Diagnosis Date   Atrial fibrillation or flutter    Should be on Coumadin but h/o non-compliance; started Sotalol 08/2009   Cellulitis    Diabetes mellitus    DVT (deep venous thrombosis) (HCC)    Recurrent RLE DVT, supposed to be on Coumadin but h/o non-compliance    Dysrhythmia    Hyperlipidemia    Hypertension    Myocardiopathy (HCC)    OSA on CPAP    Pulmonary embolism (HCC)    Sleep apnea    Venous stasis       Past Surgical History:  Procedure Laterality Date   CATARACT EXTRACTION Bilateral    LEG SURGERY      Family History  Problem Relation Age of Onset   Hypertension Mother    Diabetes Mellitus I Father    Hypertension Paternal Grandmother    Heart attack Paternal Grandmother     Social History:  reports that he quit smoking about 36 years ago. His smoking use included cigarettes. He has never been exposed to tobacco smoke. He has never used smokeless tobacco. He reports current alcohol use. He reports that he does not use drugs.  BMI: Estimated body mass index is 48.69 kg/m as calculated from the following:   Height as of 04/11/23: 6' (1.829 m).   Weight as of 04/11/23: 162.8 kg.  Lab Results  Component Value Date   ALBUMIN 4.0 02/18/2022   Diabetes:   Patient has a diagnosis of diabetes,  Lab Results  Component Value Date   HGBA1C 6.6 (H) 04/11/2023   Smoking Status:       Medications Prior to Admission  Medication Sig Dispense Refill   furosemide (LASIX) 40 MG tablet Take 40 mg by mouth daily.      insulin regular (NOVOLIN R,HUMULIN R) 100 units/mL injection Inject 25-30 Units into the skin daily.     lisinopril (ZESTRIL) 10 MG tablet Take 10 mg by mouth daily.     metoprolol succinate (TOPROL-XL) 100 MG 24 hr tablet Take 100 mg by mouth daily.     Omega-3 Fatty Acids (FISH OIL PO) Take 1 capsule by mouth daily.     Semaglutide, 1 MG/DOSE, (OZEMPIC, 1 MG/DOSE,) 4 MG/3ML SOPN Inject 1 mg into the skin every 7 (seven) days.     tamsulosin (FLOMAX) 0.4 MG CAPS capsule Take 0.4 mg by mouth daily.     XARELTO 20 MG TABS tablet Take 1 tablet (20 mg total) by mouth daily. 90 tablet 1   Continuous Blood Gluc Sensor (FREESTYLE LIBRE 3 SENSOR) MISC apply to upper back of arm and change sensor every 14 days for 28 days     ezetimibe (ZETIA) 10 MG tablet Take 1 tablet (10 mg total) by mouth daily. (Patient not taking: Reported on 03/17/2023) 30 tablet 11   metoprolol succinate (TOPROL-XL) 50 MG 24 hr tablet Take 1 tablet (50 mg total) by mouth 2 (two) times daily. (Patient not taking: Reported on 04/01/2023) 180 tablet 3     Physical Exam: Left shoulder demonstrates painful and profoundly restricted motion as noted at recent office  visit.  He does maintain good strength to manual muscle testing.  He is neurovascular intact in the left upper extremity.  Imaging studies confirm severe osteoarthritis with significant bony deformity, complete obliteration of the joint space, and peripheral osteophyte formation.  A CT scan was obtained for preop surgical planning confirming the severe bony deformity.  Vitals     Assessment/Plan  Impression: Left shoulder osteoarthritis  Plan of Action: Procedure(s): REVERSE SHOULDER ARTHROPLASTY  Victor Barnes M Victor Barnes 04/21/2023, 6:15 AM Contact # [phone]

## 2023-04-21 NOTE — Anesthesia Procedure Notes (Signed)
Procedure Name: Intubation Date/Time: 04/21/2023 8:48 AM  Performed by: Sindy Guadeloupe, CRNAPre-anesthesia Checklist: Patient identified, Emergency Drugs available, Suction available, Patient being monitored and Timeout performed Patient Re-evaluated:Patient Re-evaluated prior to induction Oxygen Delivery Method: Circle system utilized Preoxygenation: Pre-oxygenation with 100% oxygen Induction Type: IV induction Ventilation: Mask ventilation without difficulty Laryngoscope Size: Mac Tube type: Oral Tube size: 7.5 mm Number of attempts: 1 Airway Equipment and Method: Stylet Placement Confirmation: ETT inserted through vocal cords under direct vision, positive ETCO2 and breath sounds checked- equal and bilateral Secured at: 24 cm Tube secured with: Tape Dental Injury: Teeth and Oropharynx as per pre-operative assessment

## 2023-04-22 ENCOUNTER — Encounter (HOSPITAL_COMMUNITY): Payer: Self-pay | Admitting: Orthopedic Surgery

## 2023-05-04 DIAGNOSIS — Z96612 Presence of left artificial shoulder joint: Secondary | ICD-10-CM | POA: Diagnosis not present

## 2023-05-05 DIAGNOSIS — Z7901 Long term (current) use of anticoagulants: Secondary | ICD-10-CM | POA: Diagnosis not present

## 2023-05-05 DIAGNOSIS — Z8679 Personal history of other diseases of the circulatory system: Secondary | ICD-10-CM | POA: Diagnosis not present

## 2023-05-05 DIAGNOSIS — Z86010 Personal history of colonic polyps: Secondary | ICD-10-CM | POA: Diagnosis not present

## 2023-05-05 DIAGNOSIS — Z86718 Personal history of other venous thrombosis and embolism: Secondary | ICD-10-CM | POA: Diagnosis not present

## 2023-05-07 ENCOUNTER — Emergency Department (HOSPITAL_COMMUNITY): Payer: Medicare Other

## 2023-05-07 ENCOUNTER — Encounter (HOSPITAL_COMMUNITY): Payer: Self-pay

## 2023-05-07 ENCOUNTER — Emergency Department (HOSPITAL_COMMUNITY)
Admission: EM | Admit: 2023-05-07 | Discharge: 2023-05-07 | Disposition: A | Discharge status: Home / Self Care | Payer: Medicare Other | Attending: Emergency Medicine | Admitting: Emergency Medicine

## 2023-05-07 DIAGNOSIS — Z471 Aftercare following joint replacement surgery: Secondary | ICD-10-CM | POA: Diagnosis not present

## 2023-05-07 DIAGNOSIS — S40012A Contusion of left shoulder, initial encounter: Secondary | ICD-10-CM | POA: Diagnosis not present

## 2023-05-07 DIAGNOSIS — X58XXXA Exposure to other specified factors, initial encounter: Secondary | ICD-10-CM | POA: Diagnosis not present

## 2023-05-07 DIAGNOSIS — Z794 Long term (current) use of insulin: Secondary | ICD-10-CM | POA: Diagnosis not present

## 2023-05-07 DIAGNOSIS — Z96612 Presence of left artificial shoulder joint: Secondary | ICD-10-CM | POA: Diagnosis not present

## 2023-05-07 DIAGNOSIS — M25512 Pain in left shoulder: Secondary | ICD-10-CM | POA: Diagnosis not present

## 2023-05-07 DIAGNOSIS — M19012 Primary osteoarthritis, left shoulder: Secondary | ICD-10-CM | POA: Diagnosis not present

## 2023-05-07 DIAGNOSIS — D72829 Elevated white blood cell count, unspecified: Secondary | ICD-10-CM | POA: Insufficient documentation

## 2023-05-07 LAB — COMPREHENSIVE METABOLIC PANEL
ALT: 18 U/L (ref 0–44)
AST: 19 U/L (ref 15–41)
Albumin: 3.2 g/dL — ABNORMAL LOW (ref 3.5–5.0)
Alkaline Phosphatase: 108 U/L (ref 38–126)
Anion gap: 9 (ref 5–15)
BUN: 17 mg/dL (ref 8–23)
CO2: 22 mmol/L (ref 22–32)
Calcium: 8.4 mg/dL — ABNORMAL LOW (ref 8.9–10.3)
Chloride: 104 mmol/L (ref 98–111)
Creatinine, Ser: 0.53 mg/dL — ABNORMAL LOW (ref 0.61–1.24)
GFR, Estimated: >60 mL/min (ref >60)
Glucose, Bld: 162 mg/dL — ABNORMAL HIGH (ref 70–99)
Potassium: 3.8 mmol/L (ref 3.5–5.1)
Sodium: 135 mmol/L (ref 135–145)
Total Bilirubin: 0.7 mg/dL (ref 0.3–1.2)
Total Protein: 6.6 g/dL (ref 6.5–8.1)

## 2023-05-07 LAB — CBC WITH DIFFERENTIAL/PLATELET
Abs Immature Granulocytes: 0.19 10*3/uL — ABNORMAL HIGH (ref 0.00–0.07)
Basophils Absolute: 0.2 10*3/uL — ABNORMAL HIGH (ref 0.0–0.1)
Basophils Relative: 1 %
Eosinophils Absolute: 3.4 10*3/uL — ABNORMAL HIGH (ref 0.0–0.5)
Eosinophils Relative: 20 %
HCT: 35.3 % — ABNORMAL LOW (ref 39.0–52.0)
Hemoglobin: 11.5 g/dL — ABNORMAL LOW (ref 13.0–17.0)
Immature Granulocytes: 1 %
Lymphocytes Relative: 11 %
Lymphs Abs: 1.9 10*3/uL (ref 0.7–4.0)
MCH: 27.6 pg (ref 26.0–34.0)
MCHC: 32.6 g/dL (ref 30.0–36.0)
MCV: 84.7 fL (ref 80.0–100.0)
Monocytes Absolute: 1.2 10*3/uL — ABNORMAL HIGH (ref 0.1–1.0)
Monocytes Relative: 7 %
Neutro Abs: 10.6 10*3/uL — ABNORMAL HIGH (ref 1.7–7.7)
Neutrophils Relative %: 60 %
Platelets: 232 10*3/uL (ref 150–400)
RBC: 4.17 MIL/uL — ABNORMAL LOW (ref 4.22–5.81)
RDW: 15.4 % (ref 11.5–15.5)
WBC: 17.5 10*3/uL — ABNORMAL HIGH (ref 4.0–10.5)
nRBC: 0 % (ref 0.0–0.2)

## 2023-05-07 MED ORDER — OXYCODONE-ACETAMINOPHEN 10-325 MG PO TABS
1.0000 | ORAL_TABLET | ORAL | 0 refills | Status: AC | PRN
Start: 2023-05-07 — End: 2023-05-09

## 2023-05-07 MED ORDER — METHYLPREDNISOLONE 4 MG PO TBPK: ORAL_TABLET | ORAL | 0 refills | Status: DC

## 2023-05-07 MED ORDER — HYDROMORPHONE HCL 1 MG/ML IJ SOLN
1.0000 mg | Freq: Once | INTRAMUSCULAR | Status: AC
  Administered 2023-05-07: 1 mg via INTRAVENOUS
  Filled 2023-05-07: qty 1

## 2023-05-07 MED ORDER — SODIUM CHLORIDE 0.9 % IV BOLUS
500.0000 mL | Freq: Once | INTRAVENOUS | Status: AC
  Administered 2023-05-07: 500 mL via INTRAVENOUS

## 2023-05-07 NOTE — Discharge Instructions (Addendum)
Be sure to ice your shoulder multiple times per day. Use the sling until you see Dr. Rennis Chris. See him on Monday, 9/23.   We are putting you on steroids to help with the pain, but please note that this will increase your glucose and you may have to adjust your insulin accordingly.  If you develop fever, new or worsening or uncontrolled pain, numbness or weakness, redness or head to the joint, or any other new/concerning symptoms then return to the ER for evaluation or call 911.

## 2023-05-07 NOTE — ED Provider Notes (Signed)
[place] EMERGENCY DEPARTMENT AT [facility] Provider Note   CSN: [id number] Arrival date & time: 05/07/23  1241     History  Chief Complaint  Patient presents with   Post-op Problem    Victor Barnes is a 67 y.o. male.  HPI 67 year old male presents with severe left shoulder pain.  On 9/5 he had a shoulder replacement by Dr. Rennis Chris.  His wife also provides significant history.  Patient had a lot of pain when the nerve block wore off but after doubling up the oxycodone per Ortho recommendations, that seem to get better.  Has had soreness and discomfort since and yesterday when he lifted up his arm he felt like there might have been a pop.  However he did not have any increased pain.  There was some increased pain this morning but it really became worse later in the morning and now is intractable and severe.  There is some bruising that has been there since surgery but they feel like maybe his shoulder is a little more swollen.  He has not had a fever, chest pain, shortness of breath.  No numbness or weakness in his hand/arm.  Home Medications Prior to Admission medications   Medication Sig Start Date End Date Taking? Authorizing Provider  methylPREDNISolone (MEDROL DOSEPAK) 4 MG TBPK tablet Take per package instructions 05/07/23  Yes Pricilla Loveless, MD  oxyCODONE-acetaminophen (PERCOCET) 10-325 MG tablet Take 1 tablet by mouth every 4 (four) hours as needed for up to 2 days for pain. 05/07/23 05/09/23 Yes Pricilla Loveless, MD  Continuous Blood Gluc Sensor (FREESTYLE LIBRE 3 SENSOR) MISC apply to upper back of arm and change sensor every 14 days for 28 days 06/21/22   [provider]  cyclobenzaprine (FLEXERIL) 10 MG tablet Take 1 tablet (10 mg total) by mouth 3 (three) times daily as needed for muscle spasms. 04/21/23   Shuford, French Ana, PA-C  ezetimibe (ZETIA) 10 MG tablet Take 1 tablet (10 mg total) by mouth daily. Patient not taking: Reported on 03/17/2023 10/28/22  10/28/23  Swinyer, Zachary George, NP  furosemide (LASIX) 40 MG tablet Take 40 mg by mouth daily.    [provider]  insulin regular (NOVOLIN R,HUMULIN R) 100 units/mL injection Inject 25-30 Units into the skin daily.    [provider]  lisinopril (ZESTRIL) 10 MG tablet Take 10 mg by mouth daily. 02/19/23   [provider]  Omega-3 Fatty Acids (FISH OIL PO) Take 1 capsule by mouth daily.    [provider]  ondansetron (ZOFRAN) 4 MG tablet Take 1 tablet (4 mg total) by mouth every 8 (eight) hours as needed for nausea or vomiting. 04/21/23   Shuford, Tracy, PA-C  Semaglutide, 1 MG/DOSE, (OZEMPIC, 1 MG/DOSE,) 4 MG/3ML SOPN Inject 1 mg into the skin every 7 (seven) days.    [provider]  tamsulosin (FLOMAX) 0.4 MG CAPS capsule Take 0.4 mg by mouth daily.    [provider]  XARELTO 20 MG TABS tablet Take 1 tablet (20 mg total) by mouth daily. 12/22/22   Jake Bathe, MD      Allergies    Colesevelam, Crestor [rosuvastatin], Empagliflozin, Lipitor [atorvastatin], Metformin hcl er, and Pravastatin    Review of Systems   Review of Systems  Constitutional:  Negative for fever.  Respiratory:  Negative for shortness of breath.   Cardiovascular:  Negative for chest pain.  Musculoskeletal:  Positive for arthralgias.  Neurological:  Negative for weakness and numbness.  Physical Exam Updated Vital Signs BP 136/73   Pulse 91   Temp 97.7 F (36.5 C) (Oral)   Resp 18   SpO2 100%  Physical Exam Vitals and nursing note reviewed.  Constitutional:      General: He is in acute distress (in severe pain).     Appearance: He is well-developed. He is obese.  HENT:     Head: Normocephalic and atraumatic.  Cardiovascular:     Rate and Rhythm: Normal rate.     Pulses:          Radial pulses are 2+ on the left side.  Pulmonary:     Effort: Pulmonary effort is normal.  Musculoskeletal:     Left shoulder: Swelling and tenderness present. Decreased  range of motion.     Comments: See picture. There is diffuse shoulder tenderness/pain to any touch or slight movement of his shoulder. No apparent erythema or increased warmth. Grossly normal sensation in the arm and hand. He is able to grip normally in the left hand.  Skin:    General: Skin is warm and dry.     Findings: No erythema.  Neurological:     Mental Status: He is alert.     ED Results / Procedures / Treatments   Labs (all labs ordered are listed, but only abnormal results are displayed) Labs Reviewed  CBC WITH DIFFERENTIAL/PLATELET - Abnormal; Notable for the following components:      Result Value   WBC 17.5 (*)    RBC 4.17 (*)    Hemoglobin 11.5 (*)    HCT 35.3 (*)    Neutro Abs 10.6 (*)    Monocytes Absolute 1.2 (*)    Eosinophils Absolute 3.4 (*)    Basophils Absolute 0.2 (*)    Abs Immature Granulocytes 0.19 (*)    All other components within normal limits  COMPREHENSIVE METABOLIC PANEL - Abnormal; Notable for the following components:   Glucose, Bld 162 (*)    Creatinine, Ser 0.53 (*)    Calcium 8.4 (*)    Albumin 3.2 (*)    All other components within normal limits  PATHOLOGIST SMEAR REVIEW    EKG None  Radiology CT Shoulder Left Wo Contrast  Result Date: 05/07/2023 CLINICAL DATA:  Shoulder replacement, loosening suspected Shoulder pain, stress fracture suspected, neg xray. Shoulder surgery on 04/21/2023 EXAM: CT OF THE UPPER LEFT EXTREMITY WITHOUT CONTRAST TECHNIQUE: Multidetector CT imaging of the upper left extremity was performed according to the standard protocol. RADIATION DOSE REDUCTION: This exam was performed according to the departmental dose-optimization program which includes automated exposure control, adjustment of the mA and/or kV according to patient size and/or use of iterative reconstruction technique. COMPARISON:  X-ray 05/07/2023, CT 03/08/2023 FINDINGS: Bones/Joint/Cartilage Postsurgical changes from reverse left shoulder arthroplasty.  Arthroplasty components are in their expected alignment without dislocation. Excessive metallic streak artifact, degrading evaluation of the adjacent structures. The distal aspect of the humeral prosthesis was not included within the field of view. There is no periprosthetic lucency or fracture of the imaged glenoid or proximal humerus. AC joint intact with mild degenerative changes. No evidence of an acromial fracture. Ligaments Suboptimally assessed by CT. Muscles and Tendons Poorly evaluated given degree of artifact. Soft tissues Large periprosthetic fluid collection at the anterior aspect of the shoulder. Collection is predominantly high in attenuation. Collection measures approximately 14 x 9 x 13 cm (volume = 900 cm^3) and is primarily located deep to the overlying pectoralis musculature. There are a few foci of  air within the collection. Collection does appear to be contiguous with the overlying surgical incision site. IMPRESSION: 1. Postsurgical changes from reverse left shoulder arthroplasty. Large periprosthetic fluid collection at the anterior aspect of the shoulder, measuring approximately 14 x 9 x 13 cm (volume = 900 cc). Collection is predominantly high in attenuation, suggesting a hematoma. There are a few foci of air within the collection and is concerning for superimposed infection. 2. No acute fracture. Electronically Signed   By: Duanne Guess D.O.   On: 05/07/2023 17:40   DG Shoulder Left  Result Date: 05/07/2023 CLINICAL DATA:  sudden shoulder pain, post op. EXAM: LEFT SHOULDER - 2+ VIEW COMPARISON:  06/02/2022. FINDINGS: No acute fracture or dislocation. No aggressive osseous lesion. The acromioclavicular joints is normal in alignment and exhibits mild degenerative changes. Since the prior study, patient underwent left reverse shoulder arthroplasty. The hardware is intact. No periprosthetic fracture or lucency. There is near anatomic alignment. No soft tissue swelling. No radiopaque  foreign bodies. IMPRESSION: *Status post left shoulder arthroplasty. No fracture or dislocation. Electronically Signed   By: Jules Schick M.D.   On: 05/07/2023 15:20    Procedures Procedures    Medications Ordered in ED Medications  HYDROmorphone (DILAUDID) injection 1 mg (1 mg Intravenous Given 05/07/23 1408)  sodium chloride 0.9 % bolus 500 mL (0 mLs Intravenous Stopped 05/07/23 1534)    ED Course/ Medical Decision Making/ A&P                                 Medical Decision Making Amount and/or Complexity of Data Reviewed Labs: ordered.    Details: Leukocytosis Radiology: ordered and independent interpretation performed.    Details: No acute fracture  Risk Prescription drug management.   Patient was given a single dose of Dilaudid and his pain is dramatically better.  He is resting comfortably.  Still has hard time moving his shoulder.  This is mostly pain related. Discussed with [facility] on call, Dr. Aundria Rud. We discussed the case and he viewed Xray images. Recommends sling and medrol dose pack.  We did get CT scan to rule out occult acromion fracture.  There is no fracture on the CT but a sizable hematoma.  The patient is also on Xarelto.  Given this, Dr. Aundria Rud recommends sling and ice in addition to the Dosepak.  The patient is aware that the Dosepak will increase his glucose.  While he does have pain, there is no erythema or warmth to suggest this is an infection.  He has a leukocytosis though it is unclear if this is reactive versus from infection.  I did discuss this with Dr. Aundria Rud as well, but he feels comfortable this can be managed as an outpatient and recommends discharge.  Will give a short course of pain control as he is almost out of his oxycodone and have him follow-up with Dr. Rennis Chris on Monday, 9/23.  Given all this, we did discuss that he has to have a low threshold to come back, especially for fever, increased pain, etc.        Final Clinical Impression(s)  / ED Diagnoses Final diagnoses:  Hematoma of left shoulder    Rx / DC Orders ED Discharge Orders          Ordered    oxyCODONE-acetaminophen (PERCOCET) 10-325 MG tablet  Every 4 hours PRN        05/07/23 1803  methylPREDNISolone (MEDROL DOSEPAK) 4 MG TBPK tablet        05/07/23 1803              Pricilla Loveless, MD 05/07/23 1811

## 2023-05-07 NOTE — ED Triage Notes (Addendum)
Pt presents with c/o left shoulder pain following a shoulder replacement on the 5th of this month. Pt reports that the pain has been tolerable and this morning, he began to have excruciating pain out of nowhere to that shoulder. Pt reports he did feel a "pop" in that shoulder yesterday.

## 2023-05-08 DIAGNOSIS — S40012A Contusion of left shoulder, initial encounter: Secondary | ICD-10-CM | POA: Diagnosis not present

## 2023-05-09 ENCOUNTER — Other Ambulatory Visit: Payer: Self-pay | Admitting: Cardiology

## 2023-05-09 DIAGNOSIS — I4821 Permanent atrial fibrillation: Secondary | ICD-10-CM

## 2023-05-09 NOTE — Telephone Encounter (Signed)
Prescription refill request for Xarelto received.  Indication: AF Last office visit: 10/28/22  Victor Sprague NP Weight: 173.5kg Age: 67 Scr: 0.53 on 05/07/23  Epic CrCl: 173.5  Based on above findings Xarelto 20mg  daily is the appropriate dose.  Refill approved.

## 2023-05-10 LAB — PATHOLOGIST SMEAR REVIEW

## 2023-05-25 ENCOUNTER — Ambulatory Visit: Payer: Medicare Other | Admitting: Vascular Surgery

## 2023-05-25 ENCOUNTER — Encounter: Payer: Self-pay | Admitting: Vascular Surgery

## 2023-05-25 ENCOUNTER — Ambulatory Visit (HOSPITAL_COMMUNITY)
Admission: RE | Admit: 2023-05-25 | Discharge: 2023-05-25 | Disposition: A | Discharge status: Home / Self Care | Payer: Medicare Other | Source: Ambulatory Visit | Attending: Vascular Surgery | Admitting: Vascular Surgery

## 2023-05-25 VITALS — BP 126/82 | HR 90 | Temp 97.9°F | Resp 20 | Ht 72.0 in | Wt 339.0 lb

## 2023-05-25 DIAGNOSIS — I872 Venous insufficiency (chronic) (peripheral): Secondary | ICD-10-CM | POA: Insufficient documentation

## 2023-05-25 DIAGNOSIS — I890 Lymphedema, not elsewhere classified: Secondary | ICD-10-CM

## 2023-05-25 NOTE — Progress Notes (Signed)
Patient ID: Victor Barnes, male   DOB: [birth year], 67 y.o.   MRN: [id number]  Reason for Consult: Follow-up   Referred by Laurann Montana, MD  Subjective:     HPI:  Victor Barnes is a 67 y.o. male previously evaluated for bilateral lower extremity venous insufficiency and lymphedema.  He did attempt lymphedema pumps for 2 months but states that he really had no sufficient results and so he ultimately quit using them.  He has been very compliant with thigh-high compression stockings particularly on the right where he has had a wound in the past.  He states that he attempted to get into the bariatric surgery clinic but this ultimately did not work.  More recently he was initiated on Ozempic and now has lost upwards of 20 to 30 pounds and he has had some resolution of his lower extremity swelling during this time.  He also recently had a left total shoulder replacement and he is still recovering.  He states that his A1c is very well-controlled at this time and his leg symptoms are also controlled with compression stockings.  Past Medical History:  Diagnosis Date   Atrial fibrillation or flutter    Should be on Coumadin but h/o non-compliance; started Sotalol 08/2009   Cellulitis    Diabetes mellitus    DVT (deep venous thrombosis) (HCC)    Recurrent RLE DVT, supposed to be on Coumadin but h/o non-compliance    Dysrhythmia    Hyperlipidemia    Hypertension    Myocardiopathy (HCC)    OSA on CPAP    Pulmonary embolism (HCC)    Sleep apnea    Venous stasis    Family History  Problem Relation Age of Onset   Hypertension Mother    Diabetes Mellitus I Father    Hypertension Paternal Grandmother    Heart attack Paternal Grandmother    Past Surgical History:  Procedure Laterality Date   CATARACT EXTRACTION Bilateral    LEG SURGERY     REVERSE SHOULDER ARTHROPLASTY Left 04/21/2023   Procedure: REVERSE SHOULDER ARTHROPLASTY;  Surgeon: Francena Hanly, MD;  Location: [facility];  Service:  Orthopedics;  Laterality: Left;     Short Social History:  Social History   Tobacco Use   Smoking status: Former    Current packs/day: 0.00    Types: Cigarettes    Quit date: 03/23/1987    Years since quitting: 36.1    Passive exposure: Never   Smokeless tobacco: Never   Tobacco comments:    Former smoker 10/13/21  Substance Use Topics   Alcohol use: Yes    Comment: occ    Allergies  Allergen Reactions   Colesevelam     Other reaction(s): Other (See Comments)   Crestor [Rosuvastatin] Other (See Comments)    myalgias   Empagliflozin     Other reaction(s): Other (See Comments), yeast infections   Lipitor [Atorvastatin] Other (See Comments)    myalgias   Metformin Hcl Er Diarrhea   Pravastatin Other (See Comments)    myalgias    Current Outpatient Medications  Medication Sig Dispense Refill   Continuous Blood Gluc Sensor (FREESTYLE LIBRE 3 SENSOR) MISC apply to upper back of arm and change sensor every 14 days for 28 days     cyclobenzaprine (FLEXERIL) 10 MG tablet Take 1 tablet (10 mg total) by mouth 3 (three) times daily as needed for muscle spasms. 30 tablet 1   ezetimibe (ZETIA) 10 MG tablet Take 1 tablet (10 mg  total) by mouth daily. 30 tablet 11   furosemide (LASIX) 40 MG tablet Take 40 mg by mouth daily.     HYDROcodone-acetaminophen (NORCO) 10-325 MG tablet 1 q 6 hrs prn pain     insulin regular (NOVOLIN R,HUMULIN R) 100 units/mL injection Inject 25-30 Units into the skin daily.     lisinopril (ZESTRIL) 10 MG tablet Take 10 mg by mouth daily.     methocarbamol (ROBAXIN) 750 MG tablet Take 1 tablet q 8 hours PRN spasms     methylPREDNISolone (MEDROL DOSEPAK) 4 MG TBPK tablet Take per package instructions 21 tablet 0   Omega-3 Fatty Acids (FISH OIL PO) Take 1 capsule by mouth daily.     ondansetron (ZOFRAN) 4 MG tablet Take 1 tablet (4 mg total) by mouth every 8 (eight) hours as needed for nausea or vomiting. 10 tablet 0   Semaglutide, 1 MG/DOSE, (OZEMPIC, 1  MG/DOSE,) 4 MG/3ML SOPN Inject 1 mg into the skin every 7 (seven) days.     tamsulosin (FLOMAX) 0.4 MG CAPS capsule Take 0.4 mg by mouth daily.     temazepam (RESTORIL) 30 MG capsule 1 qhs prn sleep     XARELTO 20 MG TABS tablet TAKE 1 TABLET BY MOUTH EVERY DAY 90 tablet 1   No current facility-administered medications for this visit.    Review of Systems  Constitutional:  Constitutional negative. HENT: HENT negative.  Eyes: Eyes negative.  Cardiovascular: Positive for leg swelling.  GI: Gastrointestinal negative.  Musculoskeletal: Positive for joint pain.  Skin:       Skin thickening of the right greater than left leg of the ankle Neurological: Neurological negative. Hematologic: Hematologic/lymphatic negative.  Psychiatric: Psychiatric negative.        Objective:  Objective   Vitals:   05/25/23 1143  BP: 126/82  Pulse: 90  Resp: 20  Temp: 97.9 F (36.6 C)  SpO2: 98%  Weight: (!) 339 lb (153.8 kg)  Height: 6' (1.829 m)   Body mass index is 45.98 kg/m.  Physical Exam HENT:     Head: Normocephalic.     Nose: Nose normal.  Eyes:     Pupils: Pupils are equal, round, and reactive to light.  Cardiovascular:     Rate and Rhythm: Normal rate.     Pulses: Normal pulses.  Pulmonary:     Effort: Pulmonary effort is normal.  Abdominal:     General: Abdomen is flat.  Musculoskeletal:     Right lower leg: Edema present.     Left lower leg: Edema present.     Comments: Well-healed previous right medial ankle ulceration  Skin:    Capillary Refill: Capillary refill takes less than 2 seconds.  Neurological:     Mental Status: He is alert.  Psychiatric:        Mood and Affect: Mood normal.        Thought Content: Thought content normal.        Judgment: Judgment normal.     Data: Venous Reflux Times  +--------------+---------+------+-----------+------------+-----------------  ---+  RIGHT        Reflux NoRefluxReflux TimeDiameter cmsComments                                        Yes                                                +--------------+---------+------+-----------+------------+-----------------  ---+  CFV                          >1 second                                    +--------------+---------+------+-----------+------------+-----------------  ---+  FV mid        no                                    chronic thrombus       +--------------+---------+------+-----------+------------+-----------------  ---+  Popliteal              yes   >1 second             PC, chronic  thrombus  +--------------+---------+------+-----------+------------+-----------------  ---+  GSV at [facility]              yes    >500 ms      0.93                           +--------------+---------+------+-----------+------------+-----------------  ---+  GSV prox thigh          yes    >500 ms      0.68                           +--------------+---------+------+-----------+------------+-----------------  ---+  GSV mid thigh           yes    >500 ms      0.66                           +--------------+---------+------+-----------+------------+-----------------  ---+  GSV dist thigh          yes    >500 ms      0.46    chronic thrombus       +--------------+---------+------+-----------+------------+-----------------  ---+  GSV at knee             yes    >500 ms      0.37                           +--------------+---------+------+-----------+------------+-----------------  ---+  GSV prox calf                                       NV                     +--------------+---------+------+-----------+------------+-----------------  ---+  GSV mid calf            yes    >500 ms      0.36                           +--------------+---------+------+-----------+------------+-----------------  ---+  SSV Pop Fossa           yes    >500 ms      0.61                            +--------------+---------+------+-----------+------------+-----------------  ---+  SSV prox calf no                            0.29                           +--------------+---------+------+-----------+------------+-----------------  ---+  SSV mid calf            yes    >500 ms      0.33    chronic thrombus       +--------------+---------+------+-----------+------------+-----------------  ---+  AASV O        no                            0.44                           +--------------+---------+------+-----------+------------+-----------------  ---+         Summary:  Right:  - Chronic, non-occlusive thrombus with small contracted vein noted  throughout the right lower extremity.  - Venous reflux is noted in the right common femoral vein.  - Venous reflux is noted in the right sapheno-femoral junction.  - Venous reflux is noted in the right greater saphenous vein in the thigh.  - Venous reflux is noted in the right greater saphenous vein in the calf.  - Venous reflux is noted in the right popliteal vein.  - Venous reflux is noted in the right short saphenous vein.  - No reflux visualiaed at the AASV origin.  -3.70 x 1.14 structure with flow noted in the popliteal fossa.      Assessment/Plan:    67 year old male with history of C5 venous disease of the right with a very well-healed ulcer that from a years ago.  He is compliant with thigh-high compression stockings and his symptoms are improving with weight loss.  He has recently undergone left total shoulder replacement and also has been started on Ozempic which has been very helpful in his weight loss endeavor.  I have recommended continued weight loss and compression stockings as well as elevation of his legs.  As he has given good effort to using lymphedema pumps in the past with no resolution of symptoms this does not appear to be necessary.  He can follow-up with me on an as-needed basis in the future if  necessary.     Maeola Harman MD [facility]

## 2023-05-30 DIAGNOSIS — M25612 Stiffness of left shoulder, not elsewhere classified: Secondary | ICD-10-CM | POA: Diagnosis not present

## 2023-05-30 DIAGNOSIS — M25512 Pain in left shoulder: Secondary | ICD-10-CM | POA: Diagnosis not present

## 2023-06-02 DIAGNOSIS — M25512 Pain in left shoulder: Secondary | ICD-10-CM | POA: Diagnosis not present

## 2023-06-02 DIAGNOSIS — M25612 Stiffness of left shoulder, not elsewhere classified: Secondary | ICD-10-CM | POA: Diagnosis not present

## 2023-06-07 DIAGNOSIS — M25612 Stiffness of left shoulder, not elsewhere classified: Secondary | ICD-10-CM | POA: Diagnosis not present

## 2023-06-07 DIAGNOSIS — M25512 Pain in left shoulder: Secondary | ICD-10-CM | POA: Diagnosis not present

## 2023-06-09 DIAGNOSIS — M25512 Pain in left shoulder: Secondary | ICD-10-CM | POA: Diagnosis not present

## 2023-06-09 DIAGNOSIS — N181 Chronic kidney disease, stage 1: Secondary | ICD-10-CM | POA: Diagnosis not present

## 2023-06-09 DIAGNOSIS — Z794 Long term (current) use of insulin: Secondary | ICD-10-CM | POA: Diagnosis not present

## 2023-06-09 DIAGNOSIS — M25612 Stiffness of left shoulder, not elsewhere classified: Secondary | ICD-10-CM | POA: Diagnosis not present

## 2023-06-09 DIAGNOSIS — E1122 Type 2 diabetes mellitus with diabetic chronic kidney disease: Secondary | ICD-10-CM | POA: Diagnosis not present

## 2023-06-14 DIAGNOSIS — M25512 Pain in left shoulder: Secondary | ICD-10-CM | POA: Diagnosis not present

## 2023-06-14 DIAGNOSIS — M25612 Stiffness of left shoulder, not elsewhere classified: Secondary | ICD-10-CM | POA: Diagnosis not present

## 2023-06-16 DIAGNOSIS — M25512 Pain in left shoulder: Secondary | ICD-10-CM | POA: Diagnosis not present

## 2023-06-16 DIAGNOSIS — M25612 Stiffness of left shoulder, not elsewhere classified: Secondary | ICD-10-CM | POA: Diagnosis not present

## 2023-06-21 DIAGNOSIS — M25612 Stiffness of left shoulder, not elsewhere classified: Secondary | ICD-10-CM | POA: Diagnosis not present

## 2023-06-21 DIAGNOSIS — M25512 Pain in left shoulder: Secondary | ICD-10-CM | POA: Diagnosis not present

## 2023-07-12 DIAGNOSIS — M25512 Pain in left shoulder: Secondary | ICD-10-CM | POA: Diagnosis not present

## 2023-07-12 DIAGNOSIS — M25612 Stiffness of left shoulder, not elsewhere classified: Secondary | ICD-10-CM | POA: Diagnosis not present

## 2023-07-13 DIAGNOSIS — Z96612 Presence of left artificial shoulder joint: Secondary | ICD-10-CM | POA: Diagnosis not present

## 2023-09-07 DIAGNOSIS — I480 Paroxysmal atrial fibrillation: Secondary | ICD-10-CM | POA: Diagnosis not present

## 2023-09-07 DIAGNOSIS — D6869 Other thrombophilia: Secondary | ICD-10-CM | POA: Diagnosis not present

## 2023-09-07 DIAGNOSIS — Z125 Encounter for screening for malignant neoplasm of prostate: Secondary | ICD-10-CM | POA: Diagnosis not present

## 2023-09-07 DIAGNOSIS — E1122 Type 2 diabetes mellitus with diabetic chronic kidney disease: Secondary | ICD-10-CM | POA: Diagnosis not present

## 2023-09-07 DIAGNOSIS — I10 Essential (primary) hypertension: Secondary | ICD-10-CM | POA: Diagnosis not present

## 2023-09-07 DIAGNOSIS — E785 Hyperlipidemia, unspecified: Secondary | ICD-10-CM | POA: Diagnosis not present

## 2023-09-07 DIAGNOSIS — Z23 Encounter for immunization: Secondary | ICD-10-CM | POA: Diagnosis not present

## 2023-09-09 DIAGNOSIS — Z794 Long term (current) use of insulin: Secondary | ICD-10-CM | POA: Diagnosis not present

## 2023-09-09 DIAGNOSIS — E1142 Type 2 diabetes mellitus with diabetic polyneuropathy: Secondary | ICD-10-CM | POA: Diagnosis not present

## 2023-09-09 DIAGNOSIS — N181 Chronic kidney disease, stage 1: Secondary | ICD-10-CM | POA: Diagnosis not present

## 2023-09-09 DIAGNOSIS — E1122 Type 2 diabetes mellitus with diabetic chronic kidney disease: Secondary | ICD-10-CM | POA: Diagnosis not present

## 2023-09-15 DIAGNOSIS — H9190 Unspecified hearing loss, unspecified ear: Secondary | ICD-10-CM | POA: Diagnosis not present

## 2023-09-15 DIAGNOSIS — H903 Sensorineural hearing loss, bilateral: Secondary | ICD-10-CM | POA: Diagnosis not present

## 2023-09-22 DIAGNOSIS — H903 Sensorineural hearing loss, bilateral: Secondary | ICD-10-CM | POA: Diagnosis not present

## 2023-09-26 ENCOUNTER — Ambulatory Visit (HOSPITAL_BASED_OUTPATIENT_CLINIC_OR_DEPARTMENT_OTHER): Payer: Medicare Other | Attending: Family Medicine | Admitting: Physical Therapy

## 2023-09-26 ENCOUNTER — Other Ambulatory Visit: Payer: Self-pay

## 2023-09-26 ENCOUNTER — Encounter (HOSPITAL_BASED_OUTPATIENT_CLINIC_OR_DEPARTMENT_OTHER): Payer: Self-pay | Admitting: Physical Therapy

## 2023-09-26 DIAGNOSIS — M5459 Other low back pain: Secondary | ICD-10-CM | POA: Diagnosis not present

## 2023-09-26 DIAGNOSIS — M25561 Pain in right knee: Secondary | ICD-10-CM | POA: Diagnosis not present

## 2023-09-26 DIAGNOSIS — G8929 Other chronic pain: Secondary | ICD-10-CM | POA: Diagnosis not present

## 2023-09-26 DIAGNOSIS — M6281 Muscle weakness (generalized): Secondary | ICD-10-CM | POA: Diagnosis not present

## 2023-09-26 DIAGNOSIS — R2681 Unsteadiness on feet: Secondary | ICD-10-CM | POA: Insufficient documentation

## 2023-09-26 NOTE — Therapy (Signed)
 OUTPATIENT PHYSICAL THERAPY THORACOLUMBAR EVALUATION   Patient Name: Victor Barnes MRN: [id number] DOB:Sep 07, 1955, 68 y.o., male Today's Date: 09/26/2023  END OF SESSION:  PT End of Session - 09/26/23 1413     Visit Number 1    Date for PT Re-Evaluation 11/25/23    Authorization Type [employer] medicare    Progress Note Due on Visit 10    PT Start Time 0800    PT Stop Time 0840    PT Time Calculation (min) 40 min    Activity Tolerance Patient tolerated treatment well    Behavior During Therapy [facility] for tasks assessed/performed             Past Medical History:  Diagnosis Date   Atrial fibrillation or flutter    Should be on Coumadin  but h/o non-compliance; started Sotalol 08/2009   Cellulitis    Diabetes mellitus    DVT (deep venous thrombosis) (HCC)    Recurrent RLE DVT, supposed to be on Coumadin  but h/o non-compliance    Dysrhythmia    Hyperlipidemia    Hypertension    Myocardiopathy (HCC)    OSA on CPAP    Pulmonary embolism (HCC)    Sleep apnea    Venous stasis    Past Surgical History:  Procedure Laterality Date   CATARACT EXTRACTION Bilateral    LEG SURGERY     REVERSE SHOULDER ARTHROPLASTY Left 04/21/2023   Procedure: REVERSE SHOULDER ARTHROPLASTY;  Surgeon: Ellard Gunning, MD;  Location: [facility];  Service: Orthopedics;  Laterality: Left;    Patient Active Problem List   Diagnosis Date Noted   Permanent atrial fibrillation (HCC) 10/06/2021   Secondary hypercoagulable state (HCC) 10/06/2021   Venous stasis ulcer (HCC) 09/19/2015   Chronic anticoagulation 11/22/2014   Morbid obesity (HCC) 11/22/2014   Pulmonary embolism (HCC) 07/19/2011   Post-thrombotic syndrome 07/19/2011   Hypertension    Diabetes mellitus (HCC)    Cellulitis    Venous stasis    DVT (deep venous thrombosis) (HCC)    Sleep apnea    Abnormal electrocardiogram 10/03/2007   Type II or unspecified type diabetes mellitus without mention of complication, not stated as uncontrolled  10/03/2007   Primary hypertension 10/03/2007    PCP: Victorio Grave, MD   REFERRING PROVIDER: Victorio Grave, MD   REFERRING DIAG:  Other intervertebral disc degeneration, lumbar region with discogenic back pain and lower extremity pain  M17.11 (ICD-10-CM) - Osteoarthritis of right knee    Rationale for Evaluation and Treatment: Rehabilitation  THERAPY DIAG:  Chronic pain of right knee  Other low back pain  Muscle weakness (generalized)  Unsteadiness on feet  ONSET DATE: >1 yr with exacerbation  SUBJECTIVE:  SUBJECTIVE STATEMENT: My doctor says exercise will help me.  Not only do I have a back problem I have a right knee OA. DM neuroptahies feet and fingers some numbness, no pain.  Will effect my balance  PERTINENT HISTORY:  Left reverse shoulder replacement Afib DM with peripheral neuropathies  PAIN:  Are you having pain? Yes: NPRS scale: cuttent 7/10, worst 8/10; least 0/10 Pain location: LB right sided with occational radicular pattern into left leg Pain description: dull ache Aggravating factors: Standing staitc; walking x 10 minutes Relieving factors: AD; OTC   PAIN:  Are you having pain? Yes: NPRS scale: current 4/10; worst  5/10; 1-2/10 Pain location: sharp ache Pain description: right knee Aggravating factors: activity Relieving factors: sitting   PRECAUTIONS: None  RED FLAGS: None   WEIGHT BEARING RESTRICTIONS: No  FALLS:  Has patient fallen in last 6 months? No  LIVING ENVIRONMENT: Lives with: lives with their spouse Lives in: House/apartment Stairs: Yes: External: 4 steps; can reach both Has following equipment at home: Single point cane, Walker - 4 wheeled, Tour manager, and Grab bars  OCCUPATION: retired  PLOF: Independent  PATIENT GOALS: improve  balance, gain strength  NEXT MD VISIT: as needed  OBJECTIVE:  Note: Objective measures were completed at Evaluation unless otherwise noted.  DIAGNOSTIC FINDINGS:  X-ray left knee:IMPRESSION: Severe medial compartment osteoarthritis with bone-on-bone articulation. Small joint effusion.  PATIENT SURVEYS:  LEFS 41/80  COGNITION: Overall cognitive status: Within functional limits for tasks assessed     SENSATION: WFL  MUSCLE LENGTH: Hamstrings: tight tested in sitting  POSTURE:  Guarded wide BOS  PALPATION: Ms tightness throughout posterior core   LUMBAR ROM:   AROM eval  Flexion full  Extension neutral  Right lateral flexion 50% limited  Left lateral flexion 50% limited  Right rotation   Left rotation    (Blank rows = not tested)  LOWER EXTREMITY stre:     Strength (lbs) Right eval Left eval  Hip flexion 24.2 45.1  Hip extension    Hip abduction 56.4 59.4  Hip adduction    Hip internal rotation    Hip external rotation    Knee flexion    Knee extension 43.2 46.4  Ankle dorsiflexion    Ankle plantarflexion    Ankle inversion    Ankle eversion     (Blank rows = not tested)  LOWER EXTREMITY ROM:    ROM Right eval Left eval  Hip flexion    Hip extension    Hip abduction    Hip adduction    Hip internal rotation    Hip external rotation    Knee flexion 114   Knee extension 0   Ankle dorsiflexion    Ankle plantarflexion    Ankle inversion    Ankle eversion     (Blank rows = not tested)  LUMBAR SPECIAL TESTS:  Slump test: Negative  FUNCTIONAL TESTS:  30s STS: 4 from pool bench. UE required Timed up and go (TUG): 19.91 Berg Balance Scale: 24/56  GAIT: Distance walked: 400 ft Assistive device utilized: None Level of assistance: Modified independence Comments: increased BOS UE abd, slight LOB with turning. 2 standing rest periods hand on wall; audible SOB  TREATMENT  Eval Self care: pt instructed on use of ad rollator vs cane.  Safety  with amb to and from setting (distance ~ 558ft). VC for hand placement and proper STS technique, weight shift  PATIENT EDUCATION:  Education details: Discussed eval findings, rehab rationale, aquatic program progression/POC and pools in area. Patient is in agreement  Person educated: Patient and Spouse Education method: Explanation Education comprehension: verbalized understanding  HOME EXERCISE PROGRAM: Aquatic TBA  ASSESSMENT:  CLINICAL IMPRESSION: Patient is a 68 y.o. m who was seen today for physical therapy evaluation and treatment for LBP and right knee OA.  Pt presents with wife and without AD.  He reports long hx of LB and knee pain which a has exacerbated over the last 3 years. He reports progressive DM nueropathies affecting his balance. He has not had any past therapy for either issues.  Testing demonstrates that he is a high fall risk.  He has strength deficits throughout his core and LE. Gait deviation without use of AD with wide BOS.  He does not have pool access. He will benefit from skilled PT intervention to improve deficit in functional mobility, ADL's and safety.  OBJECTIVE IMPAIRMENTS: Abnormal gait, decreased activity tolerance, decreased balance, decreased endurance, decreased mobility, difficulty walking, decreased ROM, decreased strength, obesity, and pain.   ACTIVITY LIMITATIONS: carrying, lifting, bending, standing, squatting, stairs, transfers, and locomotion level  PARTICIPATION LIMITATIONS: meal prep, cleaning, shopping, community activity, occupation, and yard work  PERSONAL FACTORS: Time since onset of injury/illness/exacerbation and 3+ comorbidities: see PmHx  are also affecting patient's functional outcome.   REHAB POTENTIAL: Good  CLINICAL DECISION MAKING: Evolving/moderate complexity  EVALUATION COMPLEXITY:  Moderate   GOALS: Goals reviewed with patient? Yes  SHORT TERM GOALS: Target date: 10/24/23  Pt will tolerate full aquatic sessions consistently without increase in pain and with improving function to demonstrate good toleration and effectiveness of intervention.  Baseline: Goal status: INITIAL  2.  Pt will tolerate walking to and from setting and engaging in aquatic therapy session without excessive fatigue or increase in pain to demonstrate improved toleration to activity.  Baseline:  Goal status: INITIAL  3.  Pt will perform SLS in 3.6 ft x 15s or > unsupported to demonstrate improving balance Baseline:  Goal status: INITIAL  4.  Pt will improve lumbar side bending by 50% Baseline: see chart Goal status: INITIAL   LONG TERM GOALS: Target date: 11/25/23  Pt to improve on LEFS by at least 9 point to demonstrate statistically significant Improvement in function. Baseline: 41/80 Goal status: INITIAL  2.  Pt will improve on 30s STS test to 7 or > to demonstrate improving functional lower extremity strength, transitional movements, and balance Baseline: 4 Goal status: INITIAL  3.  Pt will improve on Tug test to <or= 15s  to demonstrate improvement in lower extremity function, mobility and decreased fall risk. Baseline:19.91  Goal status: INITIAL  4.  Pt will improve on Berg balance test to >/= /56 to demonstrate a decrease in fall risk. Baseline:  Goal status: INITIAL  5.  Pt will improve strength in right hip flex to at least 5lb from contralateral side to demonstrate improved overall physical function Baseline: see chart Goal status: INITIAL  6.  Pt will have an improvement in right knee pain by 50%. Baseline: see chart Goal status: INITIAL  PLAN:  PT FREQUENCY: 2x/week  PT DURATION: 8 weeks fist 8 session aquatic as able to schedule  PLANNED INTERVENTIONS: 97164- PT Re-evaluation, 97110-Therapeutic exercises, 97530- Therapeutic activity, 97112- Neuromuscular  re-education, 97535- Self Care, 10272- Manual therapy, [phone]- Gait training, [phone]- Orthotic Fit/training, [phone]- Aquatic Therapy, [phone]- Electrical stimulation (unattended), [phone]- Ionotophoresis 4mg /ml Dexamethasone , Patient/Family education, Balance training, Stair training,  Taping, Dry Needling, Joint mobilization, DME instructions, Cryotherapy, and Moist heat.  PLAN FOR NEXT SESSION: Aquatics: rle and core strengthening; balance and gait retraining, stair climbing pain management Land: right le strengthening, gait and balance training, stair climbing  [place]) Renee Erb MPT 09/26/23 2:14 PM [facility] [address] [place], [place], [address] Phone: [phone]   Fax:  [phone]

## 2023-09-29 DIAGNOSIS — H903 Sensorineural hearing loss, bilateral: Secondary | ICD-10-CM | POA: Diagnosis not present

## 2023-10-04 ENCOUNTER — Encounter (HOSPITAL_BASED_OUTPATIENT_CLINIC_OR_DEPARTMENT_OTHER): Payer: Self-pay | Admitting: Physical Therapy

## 2023-10-04 ENCOUNTER — Ambulatory Visit (HOSPITAL_BASED_OUTPATIENT_CLINIC_OR_DEPARTMENT_OTHER): Payer: Medicare Other | Admitting: Physical Therapy

## 2023-10-04 DIAGNOSIS — G8929 Other chronic pain: Secondary | ICD-10-CM

## 2023-10-04 DIAGNOSIS — M6281 Muscle weakness (generalized): Secondary | ICD-10-CM

## 2023-10-04 DIAGNOSIS — M25561 Pain in right knee: Secondary | ICD-10-CM | POA: Diagnosis not present

## 2023-10-04 DIAGNOSIS — M5459 Other low back pain: Secondary | ICD-10-CM

## 2023-10-04 DIAGNOSIS — R2681 Unsteadiness on feet: Secondary | ICD-10-CM

## 2023-10-04 NOTE — Therapy (Signed)
 OUTPATIENT PHYSICAL THERAPY THORACOLUMBAR TREATMENT   Patient Name: Victor Barnes MRN: [id number] DOB:11-May-1956, 68 y.o., male Today's Date: 10/04/2023  END OF SESSION:  PT End of Session - 10/04/23 0932     Visit Number 2    Date for PT Re-Evaluation 11/25/23    Authorization Type [employer] medicare    Progress Note Due on Visit 10    PT Start Time [phone]    PT Stop Time 1010    PT Time Calculation (min) 39 min    Behavior During Therapy [facility] for tasks assessed/performed             Past Medical History:  Diagnosis Date   Atrial fibrillation or flutter    Should be on Coumadin but h/o non-compliance; started Sotalol 08/2009   Cellulitis    Diabetes mellitus    DVT (deep venous thrombosis) (HCC)    Recurrent RLE DVT, supposed to be on Coumadin but h/o non-compliance    Dysrhythmia    Hyperlipidemia    Hypertension    Myocardiopathy (HCC)    OSA on CPAP    Pulmonary embolism (HCC)    Sleep apnea    Venous stasis    Past Surgical History:  Procedure Laterality Date   CATARACT EXTRACTION Bilateral    LEG SURGERY     REVERSE SHOULDER ARTHROPLASTY Left 04/21/2023   Procedure: REVERSE SHOULDER ARTHROPLASTY;  Surgeon: Francena Hanly, MD;  Location: [facility];  Service: Orthopedics;  Laterality: Left;    Patient Active Problem List   Diagnosis Date Noted   Permanent atrial fibrillation (HCC) 10/06/2021   Secondary hypercoagulable state (HCC) 10/06/2021   Venous stasis ulcer (HCC) 09/19/2015   Chronic anticoagulation 11/22/2014   Morbid obesity (HCC) 11/22/2014   Pulmonary embolism (HCC) 07/19/2011   Post-thrombotic syndrome 07/19/2011   Hypertension    Diabetes mellitus (HCC)    Cellulitis    Venous stasis    DVT (deep venous thrombosis) (HCC)    Sleep apnea    Abnormal electrocardiogram 10/03/2007   Type II or unspecified type diabetes mellitus without mention of complication, not stated as uncontrolled 10/03/2007   Primary hypertension 10/03/2007    PCP:  Laurann Montana, MD   REFERRING PROVIDER: Laurann Montana, MD   REFERRING DIAG:  Other intervertebral disc degeneration, lumbar region with discogenic back pain and lower extremity pain  M17.11 (ICD-10-CM) - Osteoarthritis of right knee    Rationale for Evaluation and Treatment: Rehabilitation  THERAPY DIAG:  Chronic pain of right knee  Other low back pain  Muscle weakness (generalized)  Unsteadiness on feet  ONSET DATE: >1 yr with exacerbation  SUBJECTIVE:  SUBJECTIVE STATEMENT: Pt reports he knows how to swim and is not afraid of water.  He has a rollator now and this has helped with walking longer distances. His daughter (whom lives 1 mile away from him) is building a pool this spring.   PERTINENT HISTORY:  Left reverse shoulder replacement Afib DM with peripheral neuropathies  PAIN:  Are you having pain? no: NPRS scale: 0/10 Pain location:  Pain description:  Aggravating factors: Standing staitc; walking x 10 minutes Relieving factors: AD; OTC   PAIN:  Are you having pain? Yes: NPRS scale: current 4/10; worst  5/10; 1-2/10 Pain location: sharp ache Pain description: right knee Aggravating factors: activity Relieving factors: sitting   PRECAUTIONS: None  RED FLAGS: None   WEIGHT BEARING RESTRICTIONS: No  FALLS:  Has patient fallen in last 6 months? No  LIVING ENVIRONMENT: Lives with: lives with their spouse Lives in: House/apartment Stairs: Yes: External: 4 steps; can reach both Has following equipment at home: Single point cane, Walker - 4 wheeled, Tour manager, and Grab bars  OCCUPATION: retired  PLOF: Independent  PATIENT GOALS: improve balance, gain strength  NEXT MD VISIT: as needed  OBJECTIVE:  Note: Objective measures were completed at Evaluation unless  otherwise noted.  DIAGNOSTIC FINDINGS:  X-ray left knee:IMPRESSION: Severe medial compartment osteoarthritis with bone-on-bone articulation. Small joint effusion.  PATIENT SURVEYS:  LEFS 41/80  COGNITION: Overall cognitive status: Within functional limits for tasks assessed     SENSATION: WFL  MUSCLE LENGTH: Hamstrings: tight tested in sitting  POSTURE:  Guarded wide BOS  PALPATION: Ms tightness throughout posterior core   LUMBAR ROM:   AROM eval  Flexion full  Extension neutral  Right lateral flexion 50% limited  Left lateral flexion 50% limited  Right rotation   Left rotation    (Blank rows = not tested)  LOWER EXTREMITY stre:     Strength (lbs) Right eval Left eval  Hip flexion 24.2 45.1  Hip extension    Hip abduction 56.4 59.4  Hip adduction    Hip internal rotation    Hip external rotation    Knee flexion    Knee extension 43.2 46.4  Ankle dorsiflexion    Ankle plantarflexion    Ankle inversion    Ankle eversion     (Blank rows = not tested)  LOWER EXTREMITY ROM:    ROM Right eval Left eval  Hip flexion    Hip extension    Hip abduction    Hip adduction    Hip internal rotation    Hip external rotation    Knee flexion 114   Knee extension 0   Ankle dorsiflexion    Ankle plantarflexion    Ankle inversion    Ankle eversion     (Blank rows = not tested)  LUMBAR SPECIAL TESTS:  Slump test: Negative  FUNCTIONAL TESTS:  30s STS: 4 from pool bench. UE required Timed up and go (TUG): 19.91 Berg Balance Scale: 24/56  GAIT: Distance walked: 400 ft Assistive device utilized: None Level of assistance: Modified independence Comments: increased BOS UE abd, slight LOB with turning. 2 standing rest periods hand on wall; audible SOB  TREATMENT  [facility] Adult PT Treatment:                                                DATE: 10/04/23 Pt seen  for aquatic therapy today.  Treatment took place in water 3.5-4.75 ft in depth at the [facility] pool. Temp of water was 91.  Pt entered/exited the pool independently via stairs side stepping with UE on rail.  -intro to aquatic therapy principles and what to expect - walking with UE on barbell -> no UE support - walking multiple laps forward/ backward - side stepping L/R x 2 laps - marching backwards/ forwards -TrA set with hollow long noodle pull down to thighs x 10 - UE on wall:  heel raises x 10;  hip abdct/ addct 2 x 5 -UE on noodle: SLS up to 5 sec each LE;  tandem gait forward/ backward 1 width each - L stretch at wall (provides relief) x 5 - relax squat with UE on wall - noodle wrapped ant, and noodle between LEs cycling  Pt requires the buoyancy and hydrostatic pressure of water for support, and to offload joints by unweighting joint load by at least 50 % in navel deep water and by at least 75-80% in chest to neck deep water.  Viscosity of the water is needed for resistance of strengthening. Water current perturbations provides challenge to standing balance requiring increased core activation.   Eval Self care: pt instructed on use of ad rollator vs cane.  Safety with amb to and from setting (distance ~ 57ft). VC for hand placement and proper STS technique, weight shift                                                                                                                                PATIENT EDUCATION:  Education details: intro to aquatic therapy Person educated: Patient and Spouse Education method: Explanation Education comprehension: verbalized understanding  HOME EXERCISE PROGRAM: Aquatic TBA  ASSESSMENT:  CLINICAL IMPRESSION: Pt demonstrates safety and independence in aquatic setting with therapist instructing from deck. Pt demonstrates confidence in setting, moving throughout all depths easily.  Pt is directed through various movement patterns and trials  in standing position. Pt reported some increased tightness in lower back with TrA set  noodle pull downs as well as tandem gait; relieved with L stretch at wall.  Goals are ongoing. Gala Romney is a good candidate for aquatic therapy and will benefit from the properties of water to progress towards land based goals.     From initial evaluation: Patient is a 68 y.o. m who was seen today for physical therapy evaluation and treatment for LBP and right knee OA.  Pt presents with wife and without AD.  He reports long hx of LB and knee pain which a has exacerbated over the last 3 years. He reports progressive DM nueropathies affecting his balance. He has not had any past therapy for either issues.  Testing demonstrates that he is a high fall risk.  He has strength deficits throughout his core and LE. Gait deviation without use of AD with wide BOS.  He does not  have pool access. He will benefit from skilled PT intervention to improve deficit in functional mobility, ADL's and safety.  OBJECTIVE IMPAIRMENTS: Abnormal gait, decreased activity tolerance, decreased balance, decreased endurance, decreased mobility, difficulty walking, decreased ROM, decreased strength, obesity, and pain.   ACTIVITY LIMITATIONS: carrying, lifting, bending, standing, squatting, stairs, transfers, and locomotion level  PARTICIPATION LIMITATIONS: meal prep, cleaning, shopping, community activity, occupation, and yard work  PERSONAL FACTORS: Time since onset of injury/illness/exacerbation and 3+ comorbidities: see PmHx  are also affecting patient's functional outcome.   REHAB POTENTIAL: Good  CLINICAL DECISION MAKING: Evolving/moderate complexity  EVALUATION COMPLEXITY: Moderate   GOALS: Goals reviewed with patient? Yes  SHORT TERM GOALS: Target date: 10/24/23  Pt will tolerate full aquatic sessions consistently without increase in pain and with improving function to demonstrate good toleration and effectiveness of intervention.  Baseline: Goal status: INITIAL  2.  Pt will tolerate walking to and from setting and  engaging in aquatic therapy session without excessive fatigue or increase in pain to demonstrate improved toleration to activity.  Baseline:  Goal status: INITIAL  3.  Pt will perform SLS in 3.6 ft x 15s or > unsupported to demonstrate improving balance Baseline:  Goal status: INITIAL  4.  Pt will improve lumbar side bending by 50% Baseline: see chart Goal status: INITIAL   LONG TERM GOALS: Target date: 11/25/23  Pt to improve on LEFS by at least 9 point to demonstrate statistically significant Improvement in function. Baseline: 41/80 Goal status: INITIAL  2.  Pt will improve on 30s STS test to 7 or > to demonstrate improving functional lower extremity strength, transitional movements, and balance Baseline: 4 Goal status: INITIAL  3.  Pt will improve on Tug test to <or= 15s  to demonstrate improvement in lower extremity function, mobility and decreased fall risk. Baseline:19.91  Goal status: INITIAL  4.  Pt will improve on Berg balance test to >/= /56 to demonstrate a decrease in fall risk. Baseline:  Goal status: INITIAL  5.  Pt will improve strength in right hip flex to at least 5lb from contralateral side to demonstrate improved overall physical function Baseline: see chart Goal status: INITIAL  6.  Pt will have an improvement in right knee pain by 50%. Baseline: see chart Goal status: INITIAL  PLAN:  PT FREQUENCY: 2x/week  PT DURATION: 8 weeks fist 8 session aquatic as able to schedule  PLANNED INTERVENTIONS: 97164- PT Re-evaluation, 97110-Therapeutic exercises, 97530- Therapeutic activity, 97112- Neuromuscular re-education, 97535- Self Care, 16109- Manual therapy, [phone]- Gait training, [phone]- Orthotic Fit/training, [phone]- Aquatic Therapy, 97014- Electrical stimulation (unattended), [phone]- Ionotophoresis 4mg /ml Dexamethasone, Patient/Family education, Balance training, Stair training, Taping, Dry Needling, Joint mobilization, DME instructions, Cryotherapy, and Moist  heat.  PLAN FOR NEXT SESSION: Aquatics: RLE and core strengthening; balance and gait retraining, stair climbing pain management Land: right LE strengthening, gait and balance training, stair climbing  Mayer Camel, [place] 10/04/23 11:18 AM [facility] [address] [place], [place], [address] Phone: [phone]   Fax:  [phone]

## 2023-10-06 DIAGNOSIS — H903 Sensorineural hearing loss, bilateral: Secondary | ICD-10-CM | POA: Diagnosis not present

## 2023-10-11 ENCOUNTER — Encounter (HOSPITAL_BASED_OUTPATIENT_CLINIC_OR_DEPARTMENT_OTHER): Payer: Self-pay | Admitting: Physical Therapy

## 2023-10-11 ENCOUNTER — Ambulatory Visit (HOSPITAL_BASED_OUTPATIENT_CLINIC_OR_DEPARTMENT_OTHER): Payer: Medicare Other | Admitting: Physical Therapy

## 2023-10-11 DIAGNOSIS — M6281 Muscle weakness (generalized): Secondary | ICD-10-CM

## 2023-10-11 DIAGNOSIS — M5459 Other low back pain: Secondary | ICD-10-CM | POA: Diagnosis not present

## 2023-10-11 DIAGNOSIS — R2681 Unsteadiness on feet: Secondary | ICD-10-CM | POA: Diagnosis not present

## 2023-10-11 DIAGNOSIS — G8929 Other chronic pain: Secondary | ICD-10-CM | POA: Diagnosis not present

## 2023-10-11 DIAGNOSIS — M25561 Pain in right knee: Secondary | ICD-10-CM | POA: Diagnosis not present

## 2023-10-11 NOTE — Therapy (Signed)
 OUTPATIENT PHYSICAL THERAPY THORACOLUMBAR TREATMENT   Patient Name: Victor Barnes MRN: [id number] DOB:[birth year], 68 y.o., male Today's Date: 10/11/2023  END OF SESSION:  PT End of Session - 10/11/23 1440     Visit Number 3    Date for PT Re-Evaluation 11/25/23    Authorization Type [employer] medicare    Progress Note Due on Visit 10    PT Start Time 1442    PT Stop Time 1525    PT Time Calculation (min) 43 min    Activity Tolerance Patient tolerated treatment well    Behavior During Therapy WFL for tasks assessed/performed             Past Medical History:  Diagnosis Date   Atrial fibrillation or flutter    Should be on Coumadin but h/o non-compliance; started Sotalol 08/2009   Cellulitis    Diabetes mellitus    DVT (deep venous thrombosis) (HCC)    Recurrent RLE DVT, supposed to be on Coumadin but h/o non-compliance    Dysrhythmia    Hyperlipidemia    Hypertension    Myocardiopathy (HCC)    OSA on CPAP    Pulmonary embolism (HCC)    Sleep apnea    Venous stasis    Past Surgical History:  Procedure Laterality Date   CATARACT EXTRACTION Bilateral    LEG SURGERY     REVERSE SHOULDER ARTHROPLASTY Left 04/21/2023   Procedure: REVERSE SHOULDER ARTHROPLASTY;  Surgeon: Francena Hanly, MD;  Location: [facility];  Service: Orthopedics;  Laterality: Left;    Patient Active Problem List   Diagnosis Date Noted   Permanent atrial fibrillation (HCC) 10/06/2021   Secondary hypercoagulable state (HCC) 10/06/2021   Venous stasis ulcer (HCC) 09/19/2015   Chronic anticoagulation 11/22/2014   Morbid obesity (HCC) 11/22/2014   Pulmonary embolism (HCC) 07/19/2011   Post-thrombotic syndrome 07/19/2011   Hypertension    Diabetes mellitus (HCC)    Cellulitis    Venous stasis    DVT (deep venous thrombosis) (HCC)    Sleep apnea    Abnormal electrocardiogram 10/03/2007   Type II or unspecified type diabetes mellitus without mention of complication, not stated as uncontrolled  10/03/2007   Primary hypertension 10/03/2007    PCP: Laurann Montana, MD   REFERRING PROVIDER: Laurann Montana, MD   REFERRING DIAG:  Other intervertebral disc degeneration, lumbar region with discogenic back pain and lower extremity pain  M17.11 (ICD-10-CM) - Osteoarthritis of right knee    Rationale for Evaluation and Treatment: Rehabilitation  THERAPY DIAG:  Chronic pain of right knee  Other low back pain  Muscle weakness (generalized)  Unsteadiness on feet  ONSET DATE: >1 yr with exacerbation  SUBJECTIVE:  SUBJECTIVE STATEMENT: Pt reports he knows how to swim and is not afraid of water.  He has a rollator now and this has helped with walking longer distances. His daughter (whom lives 1 mile away from him) is building a pool this spring.   PERTINENT HISTORY:  Left reverse shoulder replacement Afib DM with peripheral neuropathies  PAIN:  Are you having pain? no: NPRS scale: 0/10 Pain location:  Pain description:  Aggravating factors: Standing staitc; walking x 10 minutes Relieving factors: AD; OTC   PAIN:  Are you having pain? Yes: NPRS scale: current 4/10; worst  5/10; 1-2/10 Pain location: sharp ache Pain description: right knee Aggravating factors: activity Relieving factors: sitting   PRECAUTIONS: None  RED FLAGS: None   WEIGHT BEARING RESTRICTIONS: No  FALLS:  Has patient fallen in last 6 months? No  LIVING ENVIRONMENT: Lives with: lives with their spouse Lives in: House/apartment Stairs: Yes: External: 4 steps; can reach both Has following equipment at home: Single point cane, Walker - 4 wheeled, Tour manager, and Grab bars  OCCUPATION: retired  PLOF: Independent  PATIENT GOALS: improve balance, gain strength  NEXT MD VISIT: as needed  OBJECTIVE:  Note:  Objective measures were completed at Evaluation unless otherwise noted.  DIAGNOSTIC FINDINGS:  X-ray left knee:IMPRESSION: Severe medial compartment osteoarthritis with bone-on-bone articulation. Small joint effusion.  PATIENT SURVEYS:  LEFS 41/80  COGNITION: Overall cognitive status: Within functional limits for tasks assessed     SENSATION: WFL  MUSCLE LENGTH: Hamstrings: tight tested in sitting  POSTURE:  Guarded wide BOS  PALPATION: Ms tightness throughout posterior core   LUMBAR ROM:   AROM eval  Flexion full  Extension neutral  Right lateral flexion 50% limited  Left lateral flexion 50% limited  Right rotation   Left rotation    (Blank rows = not tested)  LOWER EXTREMITY stre:     Strength (lbs) Right eval Left eval  Hip flexion 24.2 45.1  Hip extension    Hip abduction 56.4 59.4  Hip adduction    Hip internal rotation    Hip external rotation    Knee flexion    Knee extension 43.2 46.4  Ankle dorsiflexion    Ankle plantarflexion    Ankle inversion    Ankle eversion     (Blank rows = not tested)  LOWER EXTREMITY ROM:    ROM Right eval Left eval  Hip flexion    Hip extension    Hip abduction    Hip adduction    Hip internal rotation    Hip external rotation    Knee flexion 114   Knee extension 0   Ankle dorsiflexion    Ankle plantarflexion    Ankle inversion    Ankle eversion     (Blank rows = not tested)  LUMBAR SPECIAL TESTS:  Slump test: Negative  FUNCTIONAL TESTS:  30s STS: 4 from pool bench. UE required Timed up and go (TUG): 19.91 Berg Balance Scale: 24/56  GAIT: Distance walked: 400 ft Assistive device utilized: None Level of assistance: Modified independence Comments: increased BOS UE abd, slight LOB with turning. 2 standing rest periods hand on wall; audible SOB  TREATMENT  [facility] Adult PT Treatment:                                                DATE: 10/04/23 Pt seen  for aquatic therapy today.  Treatment took place  in water 3.5-4.75 ft in depth at the [facility] pool. Temp of water was 91.  Pt entered/exited the pool independently via stairs side stepping with UE on rail.   - walking with UE on barbell -> no UE support - walking multiple laps forward/ backward - side stepping L/R - standing ue support blue HB toe raised; heel raises hip add/abd x 10 - L stretch at wall (provides relief) x 5 - marching forward with knee taps same side - relax squat with UE on wall - 3 way hamstring stretch against wall x 2 in ea position holding x 10 s x 2 sets -quad/hip flex stretch at steps - noodle wrapped ant, and noodle between LEs cycling -walking between exercises for recovery throughout   Pt requires the buoyancy and hydrostatic pressure of water for support, and to offload joints by unweighting joint load by at least 50 % in navel deep water and by at least 75-80% in chest to neck deep water.  Viscosity of the water is needed for resistance of strengthening. Water current perturbations provides challenge to standing balance requiring increased core activation.                                                                                                                           PATIENT EDUCATION:  Education details: intro to aquatic therapy Person educated: Patient and Spouse Education method: Explanation Education comprehension: verbalized understanding  HOME EXERCISE PROGRAM: Aquatic TBA  ASSESSMENT:  CLINICAL IMPRESSION: Pt with good response to last session reports several days with decreased knee pain. Added stretching of le today with good toleration.  Some minor left HS cramping which resolves quickly with positional adjustment.  No reports of pain.  Pt edu on TKR and general BMI requirement.  Also discussed future pool access.  Good progress.     From initial evaluation: Patient is a 68 y.o. m who was seen today for physical therapy evaluation and treatment for LBP and right  knee OA.  Pt presents with wife and without AD.  He reports long hx of LB and knee pain which a has exacerbated over the last 3 years. He reports progressive DM nueropathies affecting his balance. He has not had any past therapy for either issues.  Testing demonstrates that he is a high fall risk.  He has strength deficits throughout his core and LE. Gait deviation without use of AD with wide BOS.  He does not have pool access. He will benefit from skilled PT intervention to improve deficit in functional mobility, ADL's and safety.  OBJECTIVE IMPAIRMENTS: Abnormal gait, decreased activity tolerance, decreased balance, decreased endurance, decreased mobility, difficulty walking, decreased ROM, decreased strength, obesity, and pain.   ACTIVITY LIMITATIONS: carrying, lifting, bending, standing, squatting, stairs, transfers, and locomotion level  PARTICIPATION LIMITATIONS: meal prep, cleaning, shopping, community activity, occupation, and yard work  PERSONAL FACTORS: Time since onset of injury/illness/exacerbation and 3+  comorbidities: see PmHx  are also affecting patient's functional outcome.   REHAB POTENTIAL: Good  CLINICAL DECISION MAKING: Evolving/moderate complexity  EVALUATION COMPLEXITY: Moderate   GOALS: Goals reviewed with patient? Yes  SHORT TERM GOALS: Target date: 10/24/23  Pt will tolerate full aquatic sessions consistently without increase in pain and with improving function to demonstrate good toleration and effectiveness of intervention.  Baseline: Goal status: INITIAL  2.  Pt will tolerate walking to and from setting and engaging in aquatic therapy session without excessive fatigue or increase in pain to demonstrate improved toleration to activity.  Baseline:  Goal status: INITIAL  3.  Pt will perform SLS in 3.6 ft x 15s or > unsupported to demonstrate improving balance Baseline:  Goal status: INITIAL  4.  Pt will improve lumbar side bending by 50% Baseline: see  chart Goal status: INITIAL   LONG TERM GOALS: Target date: 11/25/23  Pt to improve on LEFS by at least 9 point to demonstrate statistically significant Improvement in function. Baseline: 41/80 Goal status: INITIAL  2.  Pt will improve on 30s STS test to 7 or > to demonstrate improving functional lower extremity strength, transitional movements, and balance Baseline: 4 Goal status: INITIAL  3.  Pt will improve on Tug test to <or= 15s  to demonstrate improvement in lower extremity function, mobility and decreased fall risk. Baseline:19.91  Goal status: INITIAL  4.  Pt will improve on Berg balance test to >/= /56 to demonstrate a decrease in fall risk. Baseline:  Goal status: INITIAL  5.  Pt will improve strength in right hip flex to at least 5lb from contralateral side to demonstrate improved overall physical function Baseline: see chart Goal status: INITIAL  6.  Pt will have an improvement in right knee pain by 50%. Baseline: see chart Goal status: INITIAL  PLAN:  PT FREQUENCY: 2x/week  PT DURATION: 8 weeks fist 8 session aquatic as able to schedule  PLANNED INTERVENTIONS: 97164- PT Re-evaluation, 97110-Therapeutic exercises, 97530- Therapeutic activity, 97112- Neuromuscular re-education, 97535- Self Care, 16109- Manual therapy, [phone]- Gait training, [phone]- Orthotic Fit/training, [phone]- Aquatic Therapy, 97014- Electrical stimulation (unattended), [phone]- Ionotophoresis 4mg /ml Dexamethasone, Patient/Family education, Balance training, Stair training, Taping, Dry Needling, Joint mobilization, DME instructions, Cryotherapy, and Moist heat.  PLAN FOR NEXT SESSION: Aquatics: RLE and core strengthening; balance and gait retraining, stair climbing pain management [place]: right LE strengthening, gait and balance training, stair climbing  Corrie Dandy Tomma Lightning) Johny Pitstick MPT 10/11/23 2:41 PM [facility] [address] [place], [place],  [address] Phone: [phone]   Fax:  [phone]

## 2023-10-14 NOTE — Progress Notes (Signed)
 Cardiology Office Note:  .   Date:  10/17/2023  ID:  Essexville Cellar, DOB [birth year], MRN [id number] PCP: Laurann Montana, MD  [facility] Providers Cardiologist:  Donato Schultz, MD    Patient Profile: .      PMH Atrial fibrillation/flutter on chronic anticoagulation RBBB Recurrent RLE DVT PE Hypertension Hyperlipidemia OSA  Type 2 DM Obesity Medical noncompliance  History of right leg DVT in 2008 treated with Coumadin for 3 months and recurrent DVT in 2010 after which lifetime anticoagulation has been recommended.  Unfortunately he had a pulmonary embolism November 2012 when noncompliant with anticoagulation and again in April 2014.  He is the stepfather of Gypsy Balsam, NP (a former [employer] NP).   Initial diagnosis of atrial fibrillation remotely, he has been in permanent A-fib for several years.  He has been on sotalol in the past.  He reestablished with Dr. Anne Fu 02/18/2022 following a 3-year absence.  He reported prior large abscess to his right medial calf that required extensive operative debridement.  Intolerant to [place] because of yeast infection.  Was developed symptoms of peripheral neuropathy from diabetes.  ABIs were normal in 2019.  He reported needing to take breaks when doing activities like mowing the lawn.  His blood pressure was elevated in clinic and lisinopril 5 mg was changed to irbesartan 150 mg daily.  TTE completed 03/10/2022 revealed LVEF improved from 40% to 65%, mildly dilated ascending aorta at 40 mm, mild LVH, and no significant valve disease.  Last cardiology clinic visit was with me 10/28/2022.  He reported hearing his heartbeat in his right ear.  Hearing has been diminished in the right ear, had an MRI which was inconclusive.  He is otherwise asymptomatic with A-fib.  He went to a sleep medicine provider but reports he is "totally" intolerant to CPAP because he sleeps on his stomach.  Admits he knows he needs to lose weight which would improve a lot  of his current problems.  He is considering weight loss surgery.  Chronic bilateral LE edema that was stable.  He denied concerning cardiac symptoms.  Zetia 10 mg daily was added due to LDL 125.  He had been intolerant of statins.  Repeat LDL slightly improved at 112.  He was encouraged to work on lifestyle modification including healthy diet and regular exercise.  He underwent repeat echo 02/23/2023 which revealed normal LVEF 60 to 65%, mild aortic dilatation stable at 42 mm.       History of Present Illness: .   JUSITN SALSGIVER is a pleasant 68 y.o. male who is here for follow-up of a fib and is accompanied by his wife. Over the past three months, he reports trend of slightly elevated BP, typically around 135-140/80-85, sometimes higher at appointments. His diabetes management has recently been changed and he is now on Lantus (30 units once a day) and Humulin on a sliding scale, as well as Ozempic. He reports enrollment in [facility] aqua therapy which is going well. He is feeling improvement along with some weight loss, and an overall positive effect on his condition. He discontinued Zetia due to severe joint pain, a side effect he has experienced with all cholesterol medications he has tried. He denies chest pain, shortness of breath, orthopnea, PND, presyncope or syncope. Occasional palpitations; no concerning recent episodes. He was diagnosed with sleep apnea but did not tolerate CPAP. Feels that he sleeps well, although he cannot sleep on his back.  He reports frequently feeling swelling  but no specific concerns for worsening edema or weight gain recently.  He feels that he has lost weight recently.   Discussed the use of AI scribe software for clinical note transcription with the patient, who gave verbal consent to proceed.   ROS: See HPI       Studies Reviewed: Marland Kitchen   EKG Interpretation Date/Time:  Monday October 17 2023 09:20:37 EST Ventricular Rate:  77 PR Interval:    QRS Duration:  164 QT  Interval:  412 QTC Calculation: 466 R Axis:   218  Text Interpretation: Atrial fibrillation Right bundle branch block When compared with ECG of 13-Oct-2021 14:48, No significant change was found Confirmed by Eligha Bridegroom [phone]) on 10/17/2023 9:24:53 AM    Risk Assessment/Calculations:    CHA2DS2-VASc Score = 5   This indicates a 7.2% annual risk of stroke. The patient's score is based upon: CHF History: 1 HTN History: 1 Diabetes History: 1 Stroke History: 0 Vascular Disease History: 1 Age Score: 1 Gender Score: 0         Physical Exam:   VS:  BP (!) 148/96   Pulse 77   Ht 6\' 1"  (1.854 m)   Wt (!) 349 lb 6.4 oz (158.5 kg)   SpO2 97%   BMI 46.10 kg/m    Wt Readings from Last 3 Encounters:  10/17/23 (!) 349 lb 6.4 oz (158.5 kg)  05/25/23 (!) 339 lb (153.8 kg)  04/11/23 (!) 359 lb (162.8 kg)    GEN: Obese, well developed in no acute distress NECK: No JVD; No carotid bruits CARDIAC: Irregular RR, no murmurs, rubs, gallops RESPIRATORY:  Clear to auscultation without rales, wheezing or rhonchi  ABDOMEN: Soft, non-tender, non-distended EXTREMITIES:  No edema; No deformity     ASSESSMENT AND PLAN: .    Permanent atrial fibrillation on chronic anticoagulation: Atrial fibrillation with well controlled ventricular rate. He is not on AV nodal blocking agents. Rare palpitations. No indications for change in current management plan. No bleeding concerns. Continue Xarelto 20 mg daily which is appropriate dose for stroke prevention for CHA2DS2-VASc score of 5.  Hyperlipidemia LDL goal < 70: Lipid panel completed 09/07/2023 with total cholesterol 170, HDL 35, LDL 114, and triglycerides 114.  He has not tolerated pravastatin, atorvastatin, or rosuvastatin due to myalgias.  He is also intolerant of ezetimibe.  Discussed importance of goal 70 or lower in the setting of diabetes.  We will get CT calcium score for further risk stratification.  We will see if he qualifies for PCSK9  inhibitor, Leqvio, or bempedoic acid. He is agreeable to try these.   Ascending aortic dilatation: Mild dilatation at 42 mm on echo 02/23/2023 (was 40 mm on echo 02/2022). We will plan for CTA of aorta to July 2025 for surveillance due to no prior aorta evaluation with CT.   Hypertension: BP is elevated today and has been elevated at home over the past few months. Stable renal function on labs completed 05/07/2023. We will add spironolactone 25 mg daily and have him return in 10-14 days for BMET.   Diabetes/Obesity: Reports recent medication changes by endocrinologist with improvement in symptoms.  He is on Ozempic and reports weight is down a few pounds. He is now doing aqua therapy and is feeling well. Management per endocrinology.        Disposition:1 year with Dr. Anne Fu  Signed, Eligha Bridegroom, NP-C

## 2023-10-17 ENCOUNTER — Encounter: Payer: Self-pay | Admitting: Nurse Practitioner

## 2023-10-17 ENCOUNTER — Ambulatory Visit: Payer: Medicare Other | Attending: Nurse Practitioner | Admitting: Nurse Practitioner

## 2023-10-17 ENCOUNTER — Other Ambulatory Visit (HOSPITAL_COMMUNITY): Payer: Self-pay

## 2023-10-17 ENCOUNTER — Other Ambulatory Visit: Payer: Self-pay | Admitting: *Deleted

## 2023-10-17 ENCOUNTER — Telehealth: Payer: Self-pay | Admitting: Pharmacy Technician

## 2023-10-17 ENCOUNTER — Telehealth: Payer: Self-pay | Admitting: Nurse Practitioner

## 2023-10-17 VITALS — BP 148/96 | HR 77 | Ht 73.0 in | Wt 349.4 lb

## 2023-10-17 DIAGNOSIS — I872 Venous insufficiency (chronic) (peripheral): Secondary | ICD-10-CM | POA: Diagnosis not present

## 2023-10-17 DIAGNOSIS — E1159 Type 2 diabetes mellitus with other circulatory complications: Secondary | ICD-10-CM

## 2023-10-17 DIAGNOSIS — I10 Essential (primary) hypertension: Secondary | ICD-10-CM

## 2023-10-17 DIAGNOSIS — I4821 Permanent atrial fibrillation: Secondary | ICD-10-CM | POA: Diagnosis not present

## 2023-10-17 DIAGNOSIS — Z794 Long term (current) use of insulin: Secondary | ICD-10-CM

## 2023-10-17 DIAGNOSIS — D6869 Other thrombophilia: Secondary | ICD-10-CM

## 2023-10-17 DIAGNOSIS — I77810 Thoracic aortic ectasia: Secondary | ICD-10-CM | POA: Diagnosis not present

## 2023-10-17 DIAGNOSIS — E785 Hyperlipidemia, unspecified: Secondary | ICD-10-CM

## 2023-10-17 DIAGNOSIS — Q2543 Congenital aneurysm of aorta: Secondary | ICD-10-CM

## 2023-10-17 MED ORDER — SPIRONOLACTONE 25 MG PO TABS
25.0000 mg | ORAL_TABLET | Freq: Every day | ORAL | 3 refills | Status: AC
Start: 2023-10-17 — End: 2024-01-15

## 2023-10-17 NOTE — Telephone Encounter (Signed)
 Hi team,  He is intolerant of pravastatin, rosuvastatin, atorvastatin and ezetimibe. We are getting CT calcium score for further risk stratification but with diabetes, goal LDL is < 70.   Can we do test claim to see if Repatha, Inclisiron or Nexletol would be financially feasible?  Thank you, Marcelino Duster

## 2023-10-17 NOTE — Telephone Encounter (Signed)
 Pharmacy Patient Advocate Encounter   Received notification from Pt Calls Messages that prior authorization for nexletol is required/requested.   Insurance verification completed.   The patient is insured through  [employer]  .   Per test claim: PA required; PA submitted to above mentioned insurance via CoverMyMeds Key/confirmation #/EOC [id number] Status is pending

## 2023-10-17 NOTE — Telephone Encounter (Signed)
 Pharmacy Patient Advocate Encounter  Received notification from  [employer]  that Prior Authorization for Repatha has been APPROVED from 10/17/23 to 10/16/24. Ran test claim, Copay is $45.00- one month. This test claim was processed through [facility]- copay amounts may vary at other pharmacies due to pharmacy/plan contracts, or as the patient moves through the different stages of their insurance plan.   PA #/Case ID/Reference #: [id number]

## 2023-10-17 NOTE — Patient Instructions (Signed)
 Medication Instructions:   START Aldactone one (1) tablet by mouth ( 25 mg) daily.  *If you need a refill on your cardiac medications before your next appointment, please call your pharmacy*   Lab Work:  Your physician recommends that you return for lab work in 10 - 14 days, No Fasting and paperwork given to patient today.    If you have labs (blood work) drawn today and your tests are completely normal, you will receive your results only by: MyChart Message (if you have MyChart) OR A paper copy in the mail If you have any lab test that is abnormal or we need to change your treatment, we will call you to review the results.   Testing/Procedures:  Non-Cardiac CT Angiography (CTA), is a special type of CT scan that uses a computer to produce multi-dimensional views of major blood vessels throughout the body. In CT angiography, a contrast material is injected through an IV to help visualize the blood vessels in July for aortic dilatation  Lebron Conners has ordered a CT coronary calcium score.   Test locations:  [facility].   This is $99 out of pocket.   Coronary CalciumScan A coronary calcium scan is an imaging test used to look for deposits of calcium and other fatty materials (plaques) in the inner lining of the blood vessels of the heart (coronary arteries). These deposits of calcium and plaques can partly clog and narrow the coronary arteries without producing any symptoms or warning signs. This puts a person at risk for a heart attack. This test can detect these deposits before symptoms develop. Tell a health care provider about: Any allergies you have. All medicines you are taking, including vitamins, herbs, eye drops, creams, and over-the-counter medicines. Any problems you or family members have had with anesthetic medicines. Any blood disorders you have. Any surgeries you have had. Any medical conditions you have. Whether you are pregnant or may be  pregnant. What are the risks? Generally, this is a safe procedure. However, problems may occur, including: Harm to a pregnant woman and her unborn baby. This test involves the use of radiation. Radiation exposure can be dangerous to a pregnant woman and her unborn baby. If you are pregnant, you generally should not have this procedure done. Slight increase in the risk of cancer. This is because of the radiation involved in the test. What happens before the procedure? No preparation is needed for this procedure. What happens during the procedure? You will undress and remove any jewelry around your neck or chest. You will put on a hospital gown. Sticky electrodes will be placed on your chest. The electrodes will be connected to an electrocardiogram (ECG) machine to record a tracing of the electrical activity of your heart. A CT scanner will take pictures of your heart. During this time, you will be asked to lie still and hold your breath for 2-3 seconds while a picture of your heart is being taken. The procedure may vary among health care providers and hospitals. What happens after the procedure? You can get dressed. You can return to your normal activities. It is up to you to get the results of your test. Ask your health care provider, or the department that is doing the test, when your results will be ready. Summary A coronary calcium scan is an imaging test used to look for deposits of calcium and other fatty materials (plaques) in the inner lining of the blood vessels of the heart (coronary arteries). Generally,  this is a safe procedure. Tell your health care provider if you are pregnant or may be pregnant. No preparation is needed for this procedure. A CT scanner will take pictures of your heart. You can return to your normal activities after the scan is done. This information is not intended to replace advice given to you by your health care provider. Make sure you discuss any questions  you have with your health care provider. Document Released: 01/29/2008 Document Revised: 06/21/2016 Document Reviewed: 06/21/2016 Elsevier Interactive Patient Education  2017 [employer]. .     Follow-Up: At [facility], you and your health needs are our priority.  As part of our continuing mission to provide you with exceptional heart care, we have created designated Provider Care Teams.  These Care Teams include your primary Cardiologist (physician) and Advanced Practice Providers (APPs -  Physician Assistants and Nurse Practitioners) who all work together to provide you with the care you need, when you need it.  We recommend signing up for the patient portal called "MyChart".  Sign up information is provided on this After Visit Summary.  MyChart is used to connect with patients for Virtual Visits (Telemedicine).  Patients are able to view lab/test results, encounter notes, upcoming appointments, etc.  Non-urgent messages can be sent to your provider as well.   To learn more about what you can do with MyChart, go to [email or web address].    Your next appointment:   1 year(s)  Provider:   Donato Schultz, MD     Other Instructions  Your physician wants you to follow-up in: 1 year.  You will receive a reminder letter in the mail two months in advance. If you don't receive a letter, please call our office to schedule the follow-up appointment.    1st Floor: - Lobby - Registration  - Pharmacy  - Lab - Cafe  2nd Floor: - PV Lab - Diagnostic Testing (echo, CT, nuclear med)  3rd Floor: - Vacant  4th Floor: - TCTS (cardiothoracic surgery) - AFib Clinic - Structural Heart Clinic - Vascular Surgery  - Vascular Ultrasound  5th Floor: - HeartCare Cardiology (general and EP) - Clinical Pharmacy for coumadin, hypertension, lipid, weight-loss medications, and med management appointments    Valet parking services will be available as well.

## 2023-10-17 NOTE — Telephone Encounter (Signed)
 Pharmacy Patient Advocate Encounter   Received notification from Pt Calls Messages that prior authorization for Repatha is required/requested.   Insurance verification completed.   The patient is insured through  [employer]  .   Per test claim: PA required; PA submitted to above mentioned insurance via CoverMyMeds Key/confirmation #/EOC [id number] Status is pending

## 2023-10-18 ENCOUNTER — Ambulatory Visit (HOSPITAL_BASED_OUTPATIENT_CLINIC_OR_DEPARTMENT_OTHER): Payer: Medicare Other | Attending: Family Medicine | Admitting: Physical Therapy

## 2023-10-18 ENCOUNTER — Encounter (HOSPITAL_BASED_OUTPATIENT_CLINIC_OR_DEPARTMENT_OTHER): Payer: Self-pay | Admitting: Physical Therapy

## 2023-10-18 ENCOUNTER — Other Ambulatory Visit (HOSPITAL_COMMUNITY): Payer: Self-pay

## 2023-10-18 DIAGNOSIS — R2681 Unsteadiness on feet: Secondary | ICD-10-CM | POA: Diagnosis not present

## 2023-10-18 DIAGNOSIS — M5459 Other low back pain: Secondary | ICD-10-CM | POA: Diagnosis not present

## 2023-10-18 DIAGNOSIS — G8929 Other chronic pain: Secondary | ICD-10-CM | POA: Insufficient documentation

## 2023-10-18 DIAGNOSIS — M25561 Pain in right knee: Secondary | ICD-10-CM | POA: Diagnosis not present

## 2023-10-18 DIAGNOSIS — M6281 Muscle weakness (generalized): Secondary | ICD-10-CM | POA: Insufficient documentation

## 2023-10-18 NOTE — Telephone Encounter (Signed)
 Pharmacy Patient Advocate Encounter  Received notification from  [employer]  that Prior Authorization for nexletol has been APPROVED from 10/17/23 to 10/16/24. Ran test claim, Copay is $195.06- one month. This test claim was processed through [facility]- copay amounts may vary at other pharmacies due to pharmacy/plan contracts, or as the patient moves through the different stages of their insurance plan.   PA #/Case ID/Reference #: [id number]

## 2023-10-18 NOTE — Telephone Encounter (Signed)
 Repatha - $45 Nexletol- $195.06  Encounters created

## 2023-10-18 NOTE — Therapy (Signed)
 OUTPATIENT PHYSICAL THERAPY THORACOLUMBAR TREATMENT   Patient Name: Victor Barnes MRN: [id number] DOB:08/14/1956, 68 y.o., male Today's Date: 10/18/2023  END OF SESSION:  PT End of Session - 10/18/23 0809     Visit Number 4    Date for PT Re-Evaluation 11/25/23    Authorization Type [employer] medicare    Progress Note Due on Visit 10    PT Start Time 0800    PT Stop Time 0839    PT Time Calculation (min) 39 min    Activity Tolerance Patient tolerated treatment well    Behavior During Therapy [facility] for tasks assessed/performed             Past Medical History:  Diagnosis Date   Atrial fibrillation or flutter    Should be on Coumadin but h/o non-compliance; started Sotalol 08/2009   Cellulitis    Diabetes mellitus    DVT (deep venous thrombosis) (HCC)    Recurrent RLE DVT, supposed to be on Coumadin but h/o non-compliance    Dysrhythmia    Hyperlipidemia    Hypertension    Myocardiopathy (HCC)    OSA on CPAP    Pulmonary embolism (HCC)    Sleep apnea    Venous stasis    Past Surgical History:  Procedure Laterality Date   CATARACT EXTRACTION Bilateral    LEG SURGERY     REVERSE SHOULDER ARTHROPLASTY Left 04/21/2023   Procedure: REVERSE SHOULDER ARTHROPLASTY;  Surgeon: Francena Hanly, MD;  Location: [facility]S;  Service: Orthopedics;  Laterality: Left;    Patient Active Problem List   Diagnosis Date Noted   Permanent atrial fibrillation (HCC) 10/06/2021   Secondary hypercoagulable state (HCC) 10/06/2021   Venous stasis ulcer (HCC) 09/19/2015   Chronic anticoagulation 11/22/2014   Morbid obesity (HCC) 11/22/2014   Pulmonary embolism (HCC) 07/19/2011   Post-thrombotic syndrome 07/19/2011   Hypertension    Diabetes mellitus (HCC)    Cellulitis    Venous stasis    DVT (deep venous thrombosis) (HCC)    Sleep apnea    Abnormal electrocardiogram 10/03/2007   Type II or unspecified type diabetes mellitus without mention of complication, not stated as uncontrolled  10/03/2007   Primary hypertension 10/03/2007    PCP: Laurann Montana, MD   REFERRING PROVIDER: Laurann Montana, MD   REFERRING DIAG:  Other intervertebral disc degeneration, lumbar region with discogenic back pain and lower extremity pain  M17.11 (ICD-10-CM) - Osteoarthritis of right knee    Rationale for Evaluation and Treatment: Rehabilitation  THERAPY DIAG:  Chronic pain of right knee  Other low back pain  Muscle weakness (generalized)  Unsteadiness on feet  ONSET DATE: >1 yr with exacerbation  SUBJECTIVE:  SUBJECTIVE STATEMENT: Pt reports 60% improvement in symptoms since starting therapy.   He reports that the walk from lobby to pool continues to bother his back; alleviated with sitting on bench.  He thought he would be sore the day after last session, but was not.   POOL ACCESS: Pt's daughter building a pool this spring.  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   PERTINENT HISTORY:  Left reverse shoulder replacement Afib DM with peripheral neuropathies  PAIN:  Are you having pain? no: NPRS scale: 0/10  (took ibuprofen prior to session)  Pain location:  Pain description:  Aggravating factors: Static standing; walking x 10 minutes Relieving factors: AD; OTC   PAIN:  Are you having pain? Yes: NPRS scale: current 4/10; worst  5/10; 1-2/10 Pain location: sharp ache Pain description: right knee Aggravating factors: activity Relieving factors: sitting   PRECAUTIONS: None  RED FLAGS: None   WEIGHT BEARING RESTRICTIONS: No  FALLS:  Has patient fallen in last 6 months? No  LIVING ENVIRONMENT: Lives with: lives with their spouse Lives in: House/apartment Stairs: Yes: External: 4 steps; can reach both Has following equipment at home: Single point cane, Walker - 4  wheeled, Tour manager, and Grab bars  OCCUPATION: retired  PLOF: Independent  PATIENT GOALS: improve balance, gain strength  NEXT MD VISIT: as needed  OBJECTIVE:  Note: Objective measures were completed at Evaluation unless otherwise noted.  DIAGNOSTIC FINDINGS:  X-ray left knee:IMPRESSION: Severe medial compartment osteoarthritis with bone-on-bone articulation. Small joint effusion.  PATIENT SURVEYS:  LEFS 41/80  COGNITION: Overall cognitive status: Within functional limits for tasks assessed     SENSATION: WFL  MUSCLE LENGTH: Hamstrings: tight tested in sitting  POSTURE:  Guarded wide BOS  PALPATION: Ms tightness throughout posterior core   LUMBAR ROM:   AROM eval  Flexion full  Extension neutral  Right lateral flexion 50% limited  Left lateral flexion 50% limited  Right rotation   Left rotation    (Blank rows = not tested)  LOWER EXTREMITY stre:     Strength (lbs) Right eval Left eval  Hip flexion 24.2 45.1  Hip extension    Hip abduction 56.4 59.4  Hip adduction    Hip internal rotation    Hip external rotation    Knee flexion    Knee extension 43.2 46.4  Ankle dorsiflexion    Ankle plantarflexion    Ankle inversion    Ankle eversion     (Blank rows = not tested)  LOWER EXTREMITY ROM:    ROM Right eval Left eval  Hip flexion    Hip extension    Hip abduction    Hip adduction    Hip internal rotation    Hip external rotation    Knee flexion 114   Knee extension 0   Ankle dorsiflexion    Ankle plantarflexion    Ankle inversion    Ankle eversion     (Blank rows = not tested)  LUMBAR SPECIAL TESTS:  Slump test: Negative  FUNCTIONAL TESTS:  30s STS: 4 from pool bench. UE required Timed up and go (TUG): 19.91 Berg Balance Scale: 24/56  GAIT: Distance walked: 400 ft Assistive device utilized: None Level of assistance: Modified independence Comments: increased BOS UE abd, slight LOB with turning. 2 standing rest periods hand  on wall; audible SOB  TREATMENT  [facility] Adult PT Treatment:  DATE: 10/18/23 Pt seen for aquatic therapy today.  Treatment took place in water 3.5-4.75 ft in depth at the [facility] pool. Temp of water was 91.  Pt entered/exited the pool independently via stairs in step-to pattern with UE on rail.  - unsupported: walking forward/ backward multiple laps - side stepping L/R with arm addct/ abdct x 1 lap -> with rainbow hand floats x 2 laps - TrA set with single rainbow hand float pull down to thighs x 5 each LE - marching with reciprocal row motion with rainbow hand floats x 2 laps - L stretch at wall x 20s x 3 - UE on wall: hip abdct/ addct 2 x 10, heel raises 2 x 10; hamstring curls 2 x 10 - return to walking forward/ backward  - wall push ups x 10  Pt requires the buoyancy and hydrostatic pressure of water for support, and to offload joints by unweighting joint load by at least 50 % in navel deep water and by at least 75-80% in chest to neck deep water.  Viscosity of the water is needed for resistance of strengthening. Water current perturbations provides challenge to standing balance requiring increased core activation.                                                                                                                           PATIENT EDUCATION:  Education details: intro to aquatic therapy Person educated: Patient  Education method: Explanation Education comprehension: verbalized understanding  HOME EXERCISE PROGRAM: Aquatic TBA  ASSESSMENT:  CLINICAL IMPRESSION: No cramping in LEs today, nor report of pain. Positive response to aquatic therapy thus far.  Pt making good progress towards goals. Will continue to progress as tolerated. Plan to trial SLS in shallow water next visit (STG).      From initial evaluation: Patient is a 68 y.o. m who was seen today for physical therapy evaluation and treatment for LBP and  right knee OA.  Pt presents with wife and without AD.  He reports long hx of LB and knee pain which a has exacerbated over the last 3 years. He reports progressive DM nueropathies affecting his balance. He has not had any past therapy for either issues.  Testing demonstrates that he is a high fall risk.  He has strength deficits throughout his core and LE. Gait deviation without use of AD with wide BOS.  He does not have pool access. He will benefit from skilled PT intervention to improve deficit in functional mobility, ADL's and safety.  OBJECTIVE IMPAIRMENTS: Abnormal gait, decreased activity tolerance, decreased balance, decreased endurance, decreased mobility, difficulty walking, decreased ROM, decreased strength, obesity, and pain.   ACTIVITY LIMITATIONS: carrying, lifting, bending, standing, squatting, stairs, transfers, and locomotion level  PARTICIPATION LIMITATIONS: meal prep, cleaning, shopping, community activity, occupation, and yard work  PERSONAL FACTORS: Time since onset of injury/illness/exacerbation and 3+ comorbidities: see PmHx  are also affecting patient's functional outcome.   REHAB POTENTIAL: Good  CLINICAL DECISION MAKING: Evolving/moderate complexity  EVALUATION COMPLEXITY: Moderate   GOALS: Goals reviewed with patient? Yes  SHORT TERM GOALS: Target date: 10/24/23  Pt will tolerate full aquatic sessions consistently without increase in pain and with improving function to demonstrate good toleration and effectiveness of intervention.  Baseline: Goal status: INITIAL  2.  Pt will tolerate walking to and from setting and engaging in aquatic therapy session without excessive fatigue or increase in pain to demonstrate improved toleration to activity.  Baseline:  Goal status: In Progress - 10/18/23  3.  Pt will perform SLS in 3.6 ft x 15s or > unsupported to demonstrate improving balance Baseline:  Goal status: INITIAL  4.  Pt will improve lumbar side bending by  50% Baseline: see chart Goal status: INITIAL   LONG TERM GOALS: Target date: 11/25/23  Pt to improve on LEFS by at least 9 point to demonstrate statistically significant Improvement in function. Baseline: 41/80 Goal status: INITIAL  2.  Pt will improve on 30s STS test to 7 or > to demonstrate improving functional lower extremity strength, transitional movements, and balance Baseline: 4 Goal status: INITIAL  3.  Pt will improve on Tug test to <or= 15s  to demonstrate improvement in lower extremity function, mobility and decreased fall risk. Baseline:19.91  Goal status: INITIAL  4.  Pt will improve on Berg balance test to >/= /56 to demonstrate a decrease in fall risk. Baseline:  Goal status: INITIAL  5.  Pt will improve strength in right hip flex to at least 5lb from contralateral side to demonstrate improved overall physical function Baseline: see chart Goal status: INITIAL  6.  Pt will have an improvement in right knee pain by 50%. Baseline: see chart Goal status: INITIAL  PLAN:  PT FREQUENCY: 2x/week  PT DURATION: 8 weeks first 8 session aquatic as able to schedule  PLANNED INTERVENTIONS: 97164- PT Re-evaluation, 97110-Therapeutic exercises, 97530- Therapeutic activity, 97112- Neuromuscular re-education, 97535- Self Care, 54098- Manual therapy, [phone]- Gait training, [phone]- Orthotic Fit/training, [phone]- Aquatic Therapy, 97014- Electrical stimulation (unattended), [phone]- Ionotophoresis 4mg /ml Dexamethasone, Patient/Family education, Balance training, Stair training, Taping, Dry Needling, Joint mobilization, DME instructions, Cryotherapy, and Moist heat.  PLAN FOR NEXT SESSION: Aquatics: RLE and core strengthening; balance and gait retraining, stair climbing pain management [place]: right LE strengthening, gait and balance training, stair climbing  Mayer Camel, [place] 10/18/23 11:45 AM [facility] [address] [place], [place], [address] Phone: [phone]   Fax:  [phone]

## 2023-10-20 DIAGNOSIS — H903 Sensorineural hearing loss, bilateral: Secondary | ICD-10-CM | POA: Diagnosis not present

## 2023-10-20 NOTE — Telephone Encounter (Signed)
 Please call patient and advise him of the medication cost. Once therapy is started, I would like to get NMR in 2-3 months.

## 2023-10-21 ENCOUNTER — Ambulatory Visit (HOSPITAL_BASED_OUTPATIENT_CLINIC_OR_DEPARTMENT_OTHER): Payer: Medicare Other | Admitting: Physical Therapy

## 2023-10-21 ENCOUNTER — Other Ambulatory Visit: Payer: Self-pay | Admitting: *Deleted

## 2023-10-21 ENCOUNTER — Encounter (HOSPITAL_BASED_OUTPATIENT_CLINIC_OR_DEPARTMENT_OTHER): Payer: Self-pay | Admitting: Physical Therapy

## 2023-10-21 DIAGNOSIS — R2681 Unsteadiness on feet: Secondary | ICD-10-CM | POA: Diagnosis not present

## 2023-10-21 DIAGNOSIS — M5459 Other low back pain: Secondary | ICD-10-CM | POA: Diagnosis not present

## 2023-10-21 DIAGNOSIS — G8929 Other chronic pain: Secondary | ICD-10-CM | POA: Diagnosis not present

## 2023-10-21 DIAGNOSIS — M6281 Muscle weakness (generalized): Secondary | ICD-10-CM

## 2023-10-21 DIAGNOSIS — E785 Hyperlipidemia, unspecified: Secondary | ICD-10-CM

## 2023-10-21 DIAGNOSIS — M25561 Pain in right knee: Secondary | ICD-10-CM | POA: Diagnosis not present

## 2023-10-21 MED ORDER — REPATHA SURECLICK 140 MG/ML ~~LOC~~ SOAJ: 140.0000 mg | SUBCUTANEOUS | 3 refills | Status: AC

## 2023-10-21 NOTE — Therapy (Signed)
 OUTPATIENT PHYSICAL THERAPY THORACOLUMBAR TREATMENT   Patient Name: Victor Barnes MRN: [id number] DOB:October 23, 1955, 68 y.o., male Today's Date: 10/21/2023  END OF SESSION:  PT End of Session - 10/21/23 0844     Visit Number 5    Date for PT Re-Evaluation 11/25/23    Authorization Type BCBS medicare    Progress Note Due on Visit 10    PT Start Time 0802    PT Stop Time 0842    PT Time Calculation (min) 40 min    Activity Tolerance Patient tolerated treatment well    Behavior During Therapy [facility] for tasks assessed/performed              Past Medical History:  Diagnosis Date   Atrial fibrillation or flutter    Should be on Coumadin but h/o non-compliance; started Sotalol 08/2009   Cellulitis    Diabetes mellitus    DVT (deep venous thrombosis) (HCC)    Recurrent RLE DVT, supposed to be on Coumadin but h/o non-compliance    Dysrhythmia    Hyperlipidemia    Hypertension    Myocardiopathy (HCC)    OSA on CPAP    Pulmonary embolism (HCC)    Sleep apnea    Venous stasis    Past Surgical History:  Procedure Laterality Date   CATARACT EXTRACTION Bilateral    LEG SURGERY     REVERSE SHOULDER ARTHROPLASTY Left 04/21/2023   Procedure: REVERSE SHOULDER ARTHROPLASTY;  Surgeon: Victor Hanly, MD;  Location: [facility];  Service: Orthopedics;  Laterality: Left;    Patient Active Problem List   Diagnosis Date Noted   Permanent atrial fibrillation (HCC) 10/06/2021   Secondary hypercoagulable state (HCC) 10/06/2021   Venous stasis ulcer (HCC) 09/19/2015   Chronic anticoagulation 11/22/2014   Morbid obesity (HCC) 11/22/2014   Pulmonary embolism (HCC) 07/19/2011   Post-thrombotic syndrome 07/19/2011   Hypertension    Diabetes mellitus (HCC)    Cellulitis    Venous stasis    DVT (deep venous thrombosis) (HCC)    Sleep apnea    Abnormal electrocardiogram 10/03/2007   Type II or unspecified type diabetes mellitus without mention of complication, not stated as uncontrolled  10/03/2007   Primary hypertension 10/03/2007    PCP: Victor Montana, MD   REFERRING PROVIDER: Laurann Montana, MD   REFERRING DIAG:  Other intervertebral disc degeneration, lumbar region with discogenic back pain and lower extremity pain  M17.11 (ICD-10-CM) - Osteoarthritis of right knee    Rationale for Evaluation and Treatment: Rehabilitation  THERAPY DIAG:  Chronic pain of right knee  Other low back pain  Muscle weakness (generalized)  Unsteadiness on feet  ONSET DATE: >1 yr with exacerbation  SUBJECTIVE:  SUBJECTIVE STATEMENT: Pt reports continued decrease in pain since onset of aquatic intervention. No knee pain  POOL ACCESS: Pt's daughter building a pool this spring.  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   PERTINENT HISTORY:  Left reverse shoulder replacement Afib DM with peripheral neuropathies  PAIN:  Are you having pain? no: NPRS scale: 1/10  (took ibuprofen prior to session)  Pain location:  Pain description:  Aggravating factors: Static standing; walking x 10 minutes Relieving factors: AD; OTC   PAIN:  Are you having pain? Yes: NPRS scale: current 0/10; worst  5/10; 1-2/10 Pain location: sharp ache Pain description: right knee Aggravating factors: activity Relieving factors: sitting   PRECAUTIONS: None  RED FLAGS: None   WEIGHT BEARING RESTRICTIONS: No  FALLS:  Has patient fallen in last 6 months? No  LIVING ENVIRONMENT: Lives with: lives with their spouse Lives in: House/apartment Stairs: Yes: External: 4 steps; can reach both Has following equipment at home: Single point cane, Walker - 4 wheeled, Tour manager, and Grab bars  OCCUPATION: retired  PLOF: Independent  PATIENT GOALS: improve balance, gain strength  NEXT MD VISIT: as  needed  OBJECTIVE:  Note: Objective measures were completed at Evaluation unless otherwise noted.  DIAGNOSTIC FINDINGS:  X-ray left knee:IMPRESSION: Severe medial compartment osteoarthritis with bone-on-bone articulation. Small joint effusion.  PATIENT SURVEYS:  LEFS 41/80  COGNITION: Overall cognitive status: Within functional limits for tasks assessed     SENSATION: WFL  MUSCLE LENGTH: Hamstrings: tight tested in sitting  POSTURE:  Guarded wide BOS  PALPATION: Ms tightness throughout posterior core   LUMBAR ROM:   AROM eval  Flexion full  Extension neutral  Right lateral flexion 50% limited  Left lateral flexion 50% limited  Right rotation   Left rotation    (Blank rows = not tested)  LOWER EXTREMITY stre:     Strength (lbs) Right eval Left eval  Hip flexion 24.2 45.1  Hip extension    Hip abduction 56.4 59.4  Hip adduction    Hip internal rotation    Hip external rotation    Knee flexion    Knee extension 43.2 46.4  Ankle dorsiflexion    Ankle plantarflexion    Ankle inversion    Ankle eversion     (Blank rows = not tested)  LOWER EXTREMITY ROM:    ROM Right eval Left eval  Hip flexion    Hip extension    Hip abduction    Hip adduction    Hip internal rotation    Hip external rotation    Knee flexion 114   Knee extension 0   Ankle dorsiflexion    Ankle plantarflexion    Ankle inversion    Ankle eversion     (Blank rows = not tested)  LUMBAR SPECIAL TESTS:  Slump test: Negative  FUNCTIONAL TESTS:  30s STS: 4 from pool bench. UE required Timed up and go (TUG): 19.91 Berg Balance Scale: 24/56  GAIT: Distance walked: 400 ft Assistive device utilized: None Level of assistance: Modified independence Comments: increased BOS UE abd, slight LOB with turning. 2 standing rest periods hand on wall; audible SOB  TREATMENT  [facility] Adult PT Treatment:                                                DATE: 3//25 Pt seen for aquatic therapy  today.  Treatment took place in water 3.5-4.75 ft in depth at the [facility] pool. Temp of water was 91.  Pt entered/exited the pool independently via stairs in step-to pattern with UE on rail.  - unsupported: walking forward/ backward multiple laps - side stepping L/R with arm addct/ abdct with yellow hand floats x 2 laps -tandem stance in 3.6 ft balance ue support yellow HB -head movements (good balance challenge)  - position above with ue add/abd x 10 ea leg forward Above does increase LB discomfort which is resolved by L stretch between - TrA set with single rainbow hand float pull down to thighs x 10 each wide stance then staggered. -STS from 3rd water step.  Good challenge for immediate standing balance. VC for execution -Stair tapping forward then side ways. UE support initially->worked toward unsupported which is good challenge and difficult.  Pt requires the buoyancy and hydrostatic pressure of water for support, and to offload joints by unweighting joint load by at least 50 % in navel deep water and by at least 75-80% in chest to neck deep water.  Viscosity of the water is needed for resistance of strengthening. Water current perturbations provides challenge to standing balance requiring increased core activation.                                                                                                                           PATIENT EDUCATION:  Education details: intro to aquatic therapy Person educated: Patient  Education method: Explanation Education comprehension: verbalized understanding  HOME EXERCISE PROGRAM: Aquatic TBA  ASSESSMENT:  CLINICAL IMPRESSION: Majority of focus today on balance.  He gives good effort with confidence using the properties of water to decrease risk of fall moving outside of his comfort area to challenge himself. He is able to gain motor plans with repetition to be successful with immediate standing balance and brief SLS with  stair tapping requiring frequent ue support to maintain position. Most activity completed in 3.6 ft which does load LB more and increased discomfort slightly.  Pt edu on balance control and mechanisms.       From initial evaluation: Patient is a 68 y.o. m who was seen today for physical therapy evaluation and treatment for LBP and right knee OA.  Pt presents with wife and without AD.  He reports long hx of LB and knee pain which a has exacerbated over the last 3 years. He reports progressive DM nueropathies affecting his balance. He has not had any past therapy for either issues.  Testing demonstrates that he is a high fall risk.  He has strength deficits throughout his core and LE. Gait deviation without use of AD with wide BOS.  He does not have pool access. He will benefit from skilled PT intervention to improve deficit in functional mobility, ADL's and safety.  OBJECTIVE IMPAIRMENTS: Abnormal gait, decreased activity tolerance, decreased balance, decreased endurance, decreased mobility, difficulty walking, decreased ROM, decreased strength, obesity, and  pain.   ACTIVITY LIMITATIONS: carrying, lifting, bending, standing, squatting, stairs, transfers, and locomotion level  PARTICIPATION LIMITATIONS: meal prep, cleaning, shopping, community activity, occupation, and yard work  PERSONAL FACTORS: Time since onset of injury/illness/exacerbation and 3+ comorbidities: see PmHx  are also affecting patient's functional outcome.   REHAB POTENTIAL: Good  CLINICAL DECISION MAKING: Evolving/moderate complexity  EVALUATION COMPLEXITY: Moderate   GOALS: Goals reviewed with patient? Yes  SHORT TERM GOALS: Target date: 10/24/23  Pt will tolerate full aquatic sessions consistently without increase in pain and with improving function to demonstrate good toleration and effectiveness of intervention.  Baseline: Goal status: Met 10/21/23  2.  Pt will tolerate walking to and from setting and engaging in  aquatic therapy session without excessive fatigue or increase in pain to demonstrate improved toleration to activity.  Baseline:  Goal status: In Progress - 10/18/23  3.  Pt will perform SLS in 3.6 ft x 15s or > unsupported to demonstrate improving balance Baseline:  Goal status: In progress 33/7/25  4.  Pt will improve lumbar side bending by 50% Baseline: see chart Goal status: INITIAL   LONG TERM GOALS: Target date: 11/25/23  Pt to improve on LEFS by at least 9 point to demonstrate statistically significant Improvement in function. Baseline: 41/80 Goal status: INITIAL  2.  Pt will improve on 30s STS test to 7 or > to demonstrate improving functional lower extremity strength, transitional movements, and balance Baseline: 4 Goal status: INITIAL  3.  Pt will improve on Tug test to <or= 15s  to demonstrate improvement in lower extremity function, mobility and decreased fall risk. Baseline:19.91  Goal status: INITIAL  4.  Pt will improve on Berg balance test to >/= /56 to demonstrate a decrease in fall risk. Baseline:  Goal status: INITIAL  5.  Pt will improve strength in right hip flex to at least 5lb from contralateral side to demonstrate improved overall physical function Baseline: see chart Goal status: INITIAL  6.  Pt will have an improvement in right knee pain by 50%. Baseline: see chart Goal status: INITIAL  PLAN:  PT FREQUENCY: 2x/week  PT DURATION: 8 weeks first 8 session aquatic as able to schedule  PLANNED INTERVENTIONS: 97164- PT Re-evaluation, 97110-Therapeutic exercises, 97530- Therapeutic activity, 97112- Neuromuscular re-education, 97535- Self Care, 32440- Manual therapy, [phone]- Gait training, [phone]- Orthotic Fit/training, [phone]- Aquatic Therapy, 97014- Electrical stimulation (unattended), [phone]- Ionotophoresis 4mg /ml Dexamethasone, Patient/Family education, Balance training, Stair training, Taping, Dry Needling, Joint mobilization, DME instructions,  Cryotherapy, and Moist heat.  PLAN FOR NEXT SESSION: Aquatics: RLE and core strengthening; balance and gait retraining, stair climbing pain management Land: right LE strengthening, gait and balance training, stair climbing  Victor Barnes) Nnaemeka Samson MPT 10/21/23 8:48 AM [facility] [address] [place], [place], [address] Phone: [phone]   Fax:  [phone]

## 2023-10-21 NOTE — Telephone Encounter (Signed)
 S/w pts spouse per (DPR) Pt will start Repatha, send into pts requested pharmacy. Pt will get fasting NMR in 2-3 months. Placed order and released. Will place in mail, confirmed address.

## 2023-10-21 NOTE — Addendum Note (Signed)
 Addended by: Debbe Bales on: 10/21/2023 09:36 AM   Modules accepted: Orders

## 2023-10-25 ENCOUNTER — Ambulatory Visit (HOSPITAL_BASED_OUTPATIENT_CLINIC_OR_DEPARTMENT_OTHER): Payer: Medicare Other | Admitting: Physical Therapy

## 2023-10-25 ENCOUNTER — Encounter (HOSPITAL_BASED_OUTPATIENT_CLINIC_OR_DEPARTMENT_OTHER): Payer: Self-pay

## 2023-10-25 DIAGNOSIS — M5416 Radiculopathy, lumbar region: Secondary | ICD-10-CM | POA: Diagnosis not present

## 2023-10-25 DIAGNOSIS — M51362 Other intervertebral disc degeneration, lumbar region with discogenic back pain and lower extremity pain: Secondary | ICD-10-CM | POA: Diagnosis not present

## 2023-10-27 ENCOUNTER — Ambulatory Visit (HOSPITAL_BASED_OUTPATIENT_CLINIC_OR_DEPARTMENT_OTHER): Payer: Medicare Other | Admitting: Physical Therapy

## 2023-10-27 DIAGNOSIS — M5416 Radiculopathy, lumbar region: Secondary | ICD-10-CM | POA: Diagnosis not present

## 2023-10-28 ENCOUNTER — Ambulatory Visit (HOSPITAL_BASED_OUTPATIENT_CLINIC_OR_DEPARTMENT_OTHER): Payer: Medicare Other | Admitting: Physical Therapy

## 2023-10-31 ENCOUNTER — Ambulatory Visit (HOSPITAL_BASED_OUTPATIENT_CLINIC_OR_DEPARTMENT_OTHER)
Admission: RE | Admit: 2023-10-31 | Discharge: 2023-10-31 | Disposition: A | Discharge status: Home / Self Care | Payer: Self-pay | Source: Ambulatory Visit | Attending: Nurse Practitioner | Admitting: Nurse Practitioner

## 2023-10-31 DIAGNOSIS — I872 Venous insufficiency (chronic) (peripheral): Secondary | ICD-10-CM | POA: Insufficient documentation

## 2023-10-31 DIAGNOSIS — I4821 Permanent atrial fibrillation: Secondary | ICD-10-CM | POA: Insufficient documentation

## 2023-10-31 DIAGNOSIS — I10 Essential (primary) hypertension: Secondary | ICD-10-CM | POA: Insufficient documentation

## 2023-10-31 DIAGNOSIS — I77810 Thoracic aortic ectasia: Secondary | ICD-10-CM | POA: Insufficient documentation

## 2023-11-01 ENCOUNTER — Ambulatory Visit (HOSPITAL_BASED_OUTPATIENT_CLINIC_OR_DEPARTMENT_OTHER): Payer: Medicare Other | Admitting: Physical Therapy

## 2023-11-01 ENCOUNTER — Encounter (HOSPITAL_BASED_OUTPATIENT_CLINIC_OR_DEPARTMENT_OTHER): Payer: Self-pay | Admitting: Physical Therapy

## 2023-11-01 DIAGNOSIS — M6281 Muscle weakness (generalized): Secondary | ICD-10-CM | POA: Diagnosis not present

## 2023-11-01 DIAGNOSIS — R2681 Unsteadiness on feet: Secondary | ICD-10-CM | POA: Diagnosis not present

## 2023-11-01 DIAGNOSIS — M5459 Other low back pain: Secondary | ICD-10-CM

## 2023-11-01 DIAGNOSIS — M25561 Pain in right knee: Secondary | ICD-10-CM | POA: Diagnosis not present

## 2023-11-01 DIAGNOSIS — G8929 Other chronic pain: Secondary | ICD-10-CM

## 2023-11-01 NOTE — Therapy (Signed)
 OUTPATIENT PHYSICAL THERAPY THORACOLUMBAR TREATMENT   Patient Name: Victor Barnes MRN: [id number] DOB:[birth year], 68 y.o., male Today's Date: 11/01/2023  END OF SESSION:  PT End of Session - 11/01/23 0758     Visit Number 6    Date for PT Re-Evaluation 11/25/23    Authorization Type BCBS medicare    Progress Note Due on Visit 10    PT Start Time 0800    PT Stop Time 0840    PT Time Calculation (min) 40 min    Activity Tolerance Patient tolerated treatment well    Behavior During Therapy [facility] for tasks assessed/performed              Past Medical History:  Diagnosis Date   Atrial fibrillation or flutter    Should be on Coumadin but h/o non-compliance; started Sotalol 08/2009   Cellulitis    Diabetes mellitus    DVT (deep venous thrombosis) (HCC)    Recurrent RLE DVT, supposed to be on Coumadin but h/o non-compliance    Dysrhythmia    Hyperlipidemia    Hypertension    Myocardiopathy (HCC)    OSA on CPAP    Pulmonary embolism (HCC)    Sleep apnea    Venous stasis    Past Surgical History:  Procedure Laterality Date   CATARACT EXTRACTION Bilateral    LEG SURGERY     REVERSE SHOULDER ARTHROPLASTY Left 04/21/2023   Procedure: REVERSE SHOULDER ARTHROPLASTY;  Surgeon: Francena Hanly, MD;  Location: [facility];  Service: Orthopedics;  Laterality: Left;    Patient Active Problem List   Diagnosis Date Noted   Permanent atrial fibrillation (HCC) 10/06/2021   Secondary hypercoagulable state (HCC) 10/06/2021   Venous stasis ulcer (HCC) 09/19/2015   Chronic anticoagulation 11/22/2014   Morbid obesity (HCC) 11/22/2014   Pulmonary embolism (HCC) 07/19/2011   Post-thrombotic syndrome 07/19/2011   Hypertension    Diabetes mellitus (HCC)    Cellulitis    Venous stasis    DVT (deep venous thrombosis) (HCC)    Sleep apnea    Abnormal electrocardiogram 10/03/2007   Type II or unspecified type diabetes mellitus without mention of complication, not stated as uncontrolled  10/03/2007   Primary hypertension 10/03/2007    PCP: Laurann Montana, MD   REFERRING PROVIDER: Laurann Montana, MD   REFERRING DIAG:  Other intervertebral disc degeneration, lumbar region with discogenic back pain and lower extremity pain  M17.11 (ICD-10-CM) - Osteoarthritis of right knee    Rationale for Evaluation and Treatment: Rehabilitation  THERAPY DIAG:  Chronic pain of right knee  Other low back pain  Muscle weakness (generalized)  Unsteadiness on feet  ONSET DATE: >1 yr with exacerbation  SUBJECTIVE:  SUBJECTIVE STATEMENT: Pt reports receiving injection Lb last thurs.  Pain in back 0/10  POOL ACCESS: Pt's daughter building a pool this spring.  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   PERTINENT HISTORY:  Left reverse shoulder replacement Afib DM with peripheral neuropathies  PAIN:  Are you having pain? no: NPRS scale: 1/10  (took ibuprofen prior to session)  Pain location:  Pain description:  Aggravating factors: Static standing; walking x 10 minutes Relieving factors: AD; OTC   PAIN:  Are you having pain? Yes: NPRS scale: current 0/10; worst  5/10; 1-2/10 Pain location: sharp ache Pain description: right knee Aggravating factors: activity Relieving factors: sitting   PRECAUTIONS: None  RED FLAGS: None   WEIGHT BEARING RESTRICTIONS: No  FALLS:  Has patient fallen in last 6 months? No  LIVING ENVIRONMENT: Lives with: lives with their spouse Lives in: House/apartment Stairs: Yes: External: 4 steps; can reach both Has following equipment at home: Single point cane, Walker - 4 wheeled, Tour manager, and Grab bars  OCCUPATION: retired  PLOF: Independent  PATIENT GOALS: improve balance, gain strength  NEXT MD VISIT: as needed  OBJECTIVE:  Note:  Objective measures were completed at Evaluation unless otherwise noted.  DIAGNOSTIC FINDINGS:  X-ray left knee:IMPRESSION: Severe medial compartment osteoarthritis with bone-on-bone articulation. Small joint effusion.  PATIENT SURVEYS:  LEFS 41/80  COGNITION: Overall cognitive status: Within functional limits for tasks assessed     SENSATION: WFL  MUSCLE LENGTH: Hamstrings: tight tested in sitting  POSTURE:  Guarded wide BOS  PALPATION: Ms tightness throughout posterior core   LUMBAR ROM:   AROM eval 11/01/23  Flexion full   Extension neutral   Right lateral flexion 50% limited Full no P!  Left lateral flexion 50% limited Full no P!  Right rotation    Left rotation     (Blank rows = not tested)  LOWER EXTREMITY stre:     Strength (lbs) Right eval Left eval  Hip flexion 24.2 45.1  Hip extension    Hip abduction 56.4 59.4  Hip adduction    Hip internal rotation    Hip external rotation    Knee flexion    Knee extension 43.2 46.4  Ankle dorsiflexion    Ankle plantarflexion    Ankle inversion    Ankle eversion     (Blank rows = not tested)  LOWER EXTREMITY ROM:    ROM Right eval Left eval  Hip flexion    Hip extension    Hip abduction    Hip adduction    Hip internal rotation    Hip external rotation    Knee flexion 114   Knee extension 0   Ankle dorsiflexion    Ankle plantarflexion    Ankle inversion    Ankle eversion     (Blank rows = not tested)  LUMBAR SPECIAL TESTS:  Slump test: Negative  FUNCTIONAL TESTS:  30s STS: 4 from pool bench. UE required Timed up and go (TUG): 19.91 Berg Balance Scale: 24/56  GAIT: Distance walked: 400 ft Assistive device utilized: None Level of assistance: Modified independence Comments: increased BOS UE abd, slight LOB with turning. 2 standing rest periods hand on wall; audible SOB  TREATMENT  [facility] Adult PT Treatment:  DATE: 11/01/23 Pt seen for aquatic  therapy today.  Treatment took place in water 3.5-4.75 ft in depth at the [facility] pool. Temp of water was 91.  Pt entered/exited the pool independently via stairs in step-to pattern with UE on rail.  - unsupported: walking forward/ backward multiple laps -Adductor sets using BB 5 x 10s hold -STS from 3rd water step.  Good challenge for immediate standing balance. VC for execution -hip hinge TC and demonstration x 5 rep, indep x 5 pausing for LB stretch in "L" position. Good execution with excellent post core engagement - TrA set with yellow noodle pull down to thighs x 10 each wide stance then staggered. -core rotator set using yellow noodle x 5 holding x5 sec -Stair tapping forward bottom step then 2nd step (2nd step increased challenge). UE support initially->worked toward unsupported which is good challenge and difficult. -SLS 4.0 ue support yellow HB. LLE increases LB discomfort.  R: ue add/abd x 4 (good challenge) - side stepping L/R with arm addct/ abdct with yellow hand floats x 2 laps  Pt requires the buoyancy and hydrostatic pressure of water for support, and to offload joints by unweighting joint load by at least 50 % in navel deep water and by at least 75-80% in chest to neck deep water.  Viscosity of the water is needed for resistance of strengthening. Water current perturbations provides challenge to standing balance requiring increased core activation.                                                                                                                           PATIENT EDUCATION:  Education details: intro to aquatic therapy Person educated: Patient  Education method: Explanation Education comprehension: verbalized understanding  HOME EXERCISE PROGRAM: Aquatic TBA  ASSESSMENT:  CLINICAL IMPRESSION: Pt reports terrible LBP 1 week ago unable to walk. Saw MD and got steroid injection with reduction of LBP almost immediately. Focused today on post core  strength and balance. Reports good toleration walking to and from setting along with full aquatic session, no excessive fatigue or pain. He does report mid lb discomfort "where the injection went" with L SLS.  He is to begin land based intervention next week.  STG # 2 and 4 met.    From initial evaluation: Patient is a 68 y.o. m who was seen today for physical therapy evaluation and treatment for LBP and right knee OA.  Pt presents with wife and without AD.  He reports long hx of LB and knee pain which a has exacerbated over the last 3 years. He reports progressive DM nueropathies affecting his balance. He has not had any past therapy for either issues.  Testing demonstrates that he is a high fall risk.  He has strength deficits throughout his core and LE. Gait deviation without use of AD with wide BOS.  He does not have pool access. He will benefit from skilled PT intervention to improve deficit in functional  mobility, ADL's and safety.  OBJECTIVE IMPAIRMENTS: Abnormal gait, decreased activity tolerance, decreased balance, decreased endurance, decreased mobility, difficulty walking, decreased ROM, decreased strength, obesity, and pain.   ACTIVITY LIMITATIONS: carrying, lifting, bending, standing, squatting, stairs, transfers, and locomotion level  PARTICIPATION LIMITATIONS: meal prep, cleaning, shopping, community activity, occupation, and yard work  PERSONAL FACTORS: Time since onset of injury/illness/exacerbation and 3+ comorbidities: see PmHx  are also affecting patient's functional outcome.   REHAB POTENTIAL: Good  CLINICAL DECISION MAKING: Evolving/moderate complexity  EVALUATION COMPLEXITY: Moderate   GOALS: Goals reviewed with patient? Yes  SHORT TERM GOALS: Target date: 10/24/23  Pt will tolerate full aquatic sessions consistently without increase in pain and with improving function to demonstrate good toleration and effectiveness of intervention.  Baseline: Goal status: Met  10/21/23  2.  Pt will tolerate walking to and from setting and engaging in aquatic therapy session without excessive fatigue or increase in pain to demonstrate improved toleration to activity.  Baseline:  Goal status: In Progress - 10/18/23; Met 11/01/23  3.  Pt will perform SLS in 3.6 ft x 15s or > unsupported to demonstrate improving balance Baseline:  Goal status: In progress 10/21/23  4.  Pt will improve lumbar side bending by 50% Baseline: see chart Goal status: Met 11/01/23   LONG TERM GOALS: Target date: 11/25/23  Pt to improve on LEFS by at least 9 point to demonstrate statistically significant Improvement in function. Baseline: 41/80 Goal status: INITIAL  2.  Pt will improve on 30s STS test to 7 or > to demonstrate improving functional lower extremity strength, transitional movements, and balance Baseline: 4 Goal status: INITIAL  3.  Pt will improve on Tug test to <or= 15s  to demonstrate improvement in lower extremity function, mobility and decreased fall risk. Baseline:19.91  Goal status: INITIAL  4.  Pt will improve on Berg balance test to >/= 43/56 to demonstrate a decrease in fall risk. Baseline:  Goal status: INITIAL  5.  Pt will improve strength in right hip flex to at least 5lb from contralateral side to demonstrate improved overall physical function Baseline: see chart Goal status: INITIAL  6.  Pt will have an improvement in right knee pain by 50%. Baseline: see chart Goal status: INITIAL  PLAN:  PT FREQUENCY: 2x/week  PT DURATION: 8 weeks first 8 session aquatic as able to schedule  PLANNED INTERVENTIONS: 97164- PT Re-evaluation, 97110-Therapeutic exercises, 97530- Therapeutic activity, 97112- Neuromuscular re-education, 97535- Self Care, 28413- Manual therapy, [phone]- Gait training, [phone]- Orthotic Fit/training, [phone]- Aquatic Therapy, 97014- Electrical stimulation (unattended), [phone]- Ionotophoresis 4mg /ml Dexamethasone, Patient/Family education, Balance  training, Stair training, Taping, Dry Needling, Joint mobilization, DME instructions, Cryotherapy, and Moist heat.  PLAN FOR NEXT SESSION: Aquatics: RLE and core strengthening; balance and gait retraining, stair climbing pain management Land: right LE strengthening, gait and balance training, stair climbing  Corrie Dandy [place]) Emmilee Reamer MPT 11/01/23 7:59 AM [facility] [address] [place], [place], [address] Phone: [phone]   Fax:  [phone]

## 2023-11-02 ENCOUNTER — Other Ambulatory Visit (HOSPITAL_BASED_OUTPATIENT_CLINIC_OR_DEPARTMENT_OTHER): Payer: Self-pay | Admitting: *Deleted

## 2023-11-04 ENCOUNTER — Encounter (HOSPITAL_BASED_OUTPATIENT_CLINIC_OR_DEPARTMENT_OTHER): Payer: Self-pay | Admitting: Physical Therapy

## 2023-11-04 ENCOUNTER — Ambulatory Visit (HOSPITAL_BASED_OUTPATIENT_CLINIC_OR_DEPARTMENT_OTHER): Payer: Medicare Other | Admitting: Physical Therapy

## 2023-11-04 DIAGNOSIS — R2681 Unsteadiness on feet: Secondary | ICD-10-CM | POA: Diagnosis not present

## 2023-11-04 DIAGNOSIS — G8929 Other chronic pain: Secondary | ICD-10-CM | POA: Diagnosis not present

## 2023-11-04 DIAGNOSIS — M5459 Other low back pain: Secondary | ICD-10-CM | POA: Diagnosis not present

## 2023-11-04 DIAGNOSIS — M25561 Pain in right knee: Secondary | ICD-10-CM | POA: Diagnosis not present

## 2023-11-04 DIAGNOSIS — M6281 Muscle weakness (generalized): Secondary | ICD-10-CM | POA: Diagnosis not present

## 2023-11-04 NOTE — Therapy (Signed)
 OUTPATIENT PHYSICAL THERAPY THORACOLUMBAR TREATMENT   Patient Name: Victor Barnes MRN: [id number] DOB:05-01-1956, 68 y.o., male Today's Date: 11/04/2023  END OF SESSION:  PT End of Session - 11/04/23 0803     Visit Number 7    Date for PT Re-Evaluation 11/25/23    Authorization Type BCBS medicare    Progress Note Due on Visit 10    PT Start Time 0801    PT Stop Time 0840    PT Time Calculation (min) 39 min    Activity Tolerance Patient tolerated treatment well    Behavior During Therapy [facility] for tasks assessed/performed               Past Medical History:  Diagnosis Date   Atrial fibrillation or flutter    Should be on Coumadin but h/o non-compliance; started Sotalol 08/2009   Cellulitis    Diabetes mellitus    DVT (deep venous thrombosis) (HCC)    Recurrent RLE DVT, supposed to be on Coumadin but h/o non-compliance    Dysrhythmia    Hyperlipidemia    Hypertension    Myocardiopathy (HCC)    OSA on CPAP    Pulmonary embolism (HCC)    Sleep apnea    Venous stasis    Past Surgical History:  Procedure Laterality Date   CATARACT EXTRACTION Bilateral    LEG SURGERY     REVERSE SHOULDER ARTHROPLASTY Left 04/21/2023   Procedure: REVERSE SHOULDER ARTHROPLASTY;  Surgeon: Francena Hanly, MD;  Location: [facility];  Service: Orthopedics;  Laterality: Left;    Patient Active Problem List   Diagnosis Date Noted   Permanent atrial fibrillation (HCC) 10/06/2021   Secondary hypercoagulable state (HCC) 10/06/2021   Venous stasis ulcer (HCC) 09/19/2015   Chronic anticoagulation 11/22/2014   Morbid obesity (HCC) 11/22/2014   Pulmonary embolism (HCC) 07/19/2011   Post-thrombotic syndrome 07/19/2011   Hypertension    Diabetes mellitus (HCC)    Cellulitis    Venous stasis    DVT (deep venous thrombosis) (HCC)    Sleep apnea    Abnormal electrocardiogram 10/03/2007   Type II or unspecified type diabetes mellitus without mention of complication, not stated as uncontrolled  10/03/2007   Primary hypertension 10/03/2007    PCP: Laurann Montana, MD   REFERRING PROVIDER: Laurann Montana, MD   REFERRING DIAG:  Other intervertebral disc degeneration, lumbar region with discogenic back pain and lower extremity pain  M17.11 (ICD-10-CM) - Osteoarthritis of right knee    Rationale for Evaluation and Treatment: Rehabilitation  THERAPY DIAG:  Chronic pain of right knee  Other low back pain  Muscle weakness (generalized)  Unsteadiness on feet  ONSET DATE: >1 yr with exacerbation  SUBJECTIVE:  SUBJECTIVE STATEMENT: Pt reports 0/10 lbp.   POOL ACCESS: Pt's daughter building a pool this spring.  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   PERTINENT HISTORY:  Left reverse shoulder replacement Afib DM with peripheral neuropathies  PAIN:  Are you having pain? no: NPRS scale: 0/10  (took ibuprofen prior to session)  Pain location:  Pain description:  Aggravating factors: Static standing; walking x 10 minutes Relieving factors: AD; OTC   PAIN:  Are you having pain? Yes: NPRS scale: current 0/10; worst  5/10; 1-2/10 Pain location: sharp ache Pain description: right knee Aggravating factors: activity Relieving factors: sitting   PRECAUTIONS: None  RED FLAGS: None   WEIGHT BEARING RESTRICTIONS: No  FALLS:  Has patient fallen in last 6 months? No  LIVING ENVIRONMENT: Lives with: lives with their spouse Lives in: House/apartment Stairs: Yes: External: 4 steps; can reach both Has following equipment at home: Single point cane, Walker - 4 wheeled, Tour manager, and Grab bars  OCCUPATION: retired  PLOF: Independent  PATIENT GOALS: improve balance, gain strength  NEXT MD VISIT: as needed  OBJECTIVE:  Note: Objective measures were completed at Evaluation  unless otherwise noted.  DIAGNOSTIC FINDINGS:  X-ray left knee:IMPRESSION: Severe medial compartment osteoarthritis with bone-on-bone articulation. Small joint effusion.  PATIENT SURVEYS:  LEFS 41/80  COGNITION: Overall cognitive status: Within functional limits for tasks assessed     SENSATION: WFL  MUSCLE LENGTH: Hamstrings: tight tested in sitting  POSTURE:  Guarded wide BOS  PALPATION: Ms tightness throughout posterior core   LUMBAR ROM:   AROM eval 11/01/23  Flexion full   Extension neutral   Right lateral flexion 50% limited Full no P!  Left lateral flexion 50% limited Full no P!  Right rotation    Left rotation     (Blank rows = not tested)  LOWER EXTREMITY stre:     Strength (lbs) Right eval Left eval  Hip flexion 24.2 45.1  Hip extension    Hip abduction 56.4 59.4  Hip adduction    Hip internal rotation    Hip external rotation    Knee flexion    Knee extension 43.2 46.4  Ankle dorsiflexion    Ankle plantarflexion    Ankle inversion    Ankle eversion     (Blank rows = not tested)  LOWER EXTREMITY ROM:    ROM Right eval Left eval  Hip flexion    Hip extension    Hip abduction    Hip adduction    Hip internal rotation    Hip external rotation    Knee flexion 114   Knee extension 0   Ankle dorsiflexion    Ankle plantarflexion    Ankle inversion    Ankle eversion     (Blank rows = not tested)  LUMBAR SPECIAL TESTS:  Slump test: Negative  FUNCTIONAL TESTS:  30s STS: 4 from pool bench. UE required Timed up and go (TUG): 19.91 Berg Balance Scale: 24/56  GAIT: Distance walked: 400 ft Assistive device utilized: None Level of assistance: Modified independence Comments: increased BOS UE abd, slight LOB with turning. 2 standing rest periods hand on wall; audible SOB  TREATMENT  [facility] Adult PT Treatment:                                                DATE: 11/04/23 Pt seen for  aquatic therapy today.  Treatment took place in water  3.5-4.75 ft in depth at the [facility] pool. Temp of water was 91.  Pt entered/exited the pool independently via stairs in step-to pattern with UE on rail.  - unsupported: walking forward/ backward multiple laps -Farmers carry blue HB forward, back and side.  Cues for abd bracing - side stepping L/R with arm addct/ abdct with blue hand floats x 4 laps -tandem stance with ue add/abd blue HB 2 x 10 (core and glut engagement) -hip hinge VC x10, pause in L position for stretch. -resisted shoulder extension using rider band 2 x 10 3.59ft rail -resisted row using rider band x10, staggered stance R/L x5 pt reports feeling weaker with lle forward vs rle -STS from 3rd water step. Decreased BOS for extra challenge  Good challenge for immediate standing balance. VC for increasing momentum to challenge -STS with adductor set using BB requires min asst (difficult)  Pt requires the buoyancy and hydrostatic pressure of water for support, and to offload joints by unweighting joint load by at least 50 % in navel deep water and by at least 75-80% in chest to neck deep water.  Viscosity of the water is needed for resistance of strengthening. Water current perturbations provides challenge to standing balance requiring increased core activation.                                                                                                                           PATIENT EDUCATION:  Education details: intro to aquatic therapy Person educated: Patient  Education method: Explanation Education comprehension: verbalized understanding  HOME EXERCISE PROGRAM: Aquatic TBA  ASSESSMENT:  CLINICAL IMPRESSION: Good response to last treatment without residual pain.  Pt reports he feels he is getting stronger.  Progressed core strengthening today by advancing to heavier resistance foam (blue hb) where applicable. Able to gain excellent post core engagement with hip hiking increasing sets.  He will begin  land based transitioning next week.  Goals ongoing.      From initial evaluation: Patient is a 68 y.o. m who was seen today for physical therapy evaluation and treatment for LBP and right knee OA.  Pt presents with wife and without AD.  He reports long hx of LB and knee pain which a has exacerbated over the last 3 years. He reports progressive DM nueropathies affecting his balance. He has not had any past therapy for either issues.  Testing demonstrates that he is a high fall risk.  He has strength deficits throughout his core and LE. Gait deviation without use of AD with wide BOS.  He does not have pool access. He will benefit from skilled PT intervention to improve deficit in functional mobility, ADL's and safety.  OBJECTIVE IMPAIRMENTS: Abnormal gait, decreased activity tolerance, decreased balance, decreased endurance, decreased mobility, difficulty walking, decreased ROM, decreased strength, obesity, and pain.   ACTIVITY LIMITATIONS: carrying, lifting, bending, standing, squatting, stairs, transfers, and locomotion level  PARTICIPATION LIMITATIONS: meal prep, cleaning, shopping, community activity, occupation, and yard work  PERSONAL FACTORS: Time since onset of injury/illness/exacerbation and 3+ comorbidities: see PmHx  are also affecting patient's functional outcome.   REHAB POTENTIAL: Good  CLINICAL DECISION MAKING: Evolving/moderate complexity  EVALUATION COMPLEXITY: Moderate   GOALS: Goals reviewed with patient? Yes  SHORT TERM GOALS: Target date: 10/24/23  Pt will tolerate full aquatic sessions consistently without increase in pain and with improving function to demonstrate good toleration and effectiveness of intervention.  Baseline: Goal status: Met 10/21/23  2.  Pt will tolerate walking to and from setting and engaging in aquatic therapy session without excessive fatigue or increase in pain to demonstrate improved toleration to activity.  Baseline:  Goal status: In  Progress - 10/18/23; Met 11/01/23  3.  Pt will perform SLS in 3.6 ft x 15s or > unsupported to demonstrate improving balance Baseline:  Goal status: In progress 10/21/23  4.  Pt will improve lumbar side bending by 50% Baseline: see chart Goal status: Met 11/01/23   LONG TERM GOALS: Target date: 11/25/23  Pt to improve on LEFS by at least 9 point to demonstrate statistically significant Improvement in function. Baseline: 41/80 Goal status: INITIAL  2.  Pt will improve on 30s STS test to 7 or > to demonstrate improving functional lower extremity strength, transitional movements, and balance Baseline: 4 Goal status: INITIAL  3.  Pt will improve on Tug test to <or= 15s  to demonstrate improvement in lower extremity function, mobility and decreased fall risk. Baseline:19.91  Goal status: INITIAL  4.  Pt will improve on Berg balance test to >/= 43/56 to demonstrate a decrease in fall risk. Baseline:  Goal status: INITIAL  5.  Pt will improve strength in right hip flex to at least 5lb from contralateral side to demonstrate improved overall physical function Baseline: see chart Goal status: INITIAL  6.  Pt will have an improvement in right knee pain by 50%. Baseline: see chart Goal status: INITIAL  PLAN:  PT FREQUENCY: 2x/week  PT DURATION: 8 weeks first 8 session aquatic as able to schedule  PLANNED INTERVENTIONS: 97164- PT Re-evaluation, 97110-Therapeutic exercises, 97530- Therapeutic activity, 97112- Neuromuscular re-education, 97535- Self Care, 16109- Manual therapy, [phone]- Gait training, [phone]- Orthotic Fit/training, [phone]- Aquatic Therapy, 97014- Electrical stimulation (unattended), [phone]- Ionotophoresis 4mg /ml Dexamethasone, Patient/Family education, Balance training, Stair training, Taping, Dry Needling, Joint mobilization, DME instructions, Cryotherapy, and Moist heat.  PLAN FOR NEXT SESSION: Aquatics: RLE and core strengthening; balance and gait retraining, stair climbing pain  management Land: right LE strengthening, gait and balance training, stair climbing  Rushie Chestnut) Victoriano Campion MPT 11/04/23 8:54 AM [facility] [address] [place], [place], [address] Phone: [phone]   Fax:  [phone]

## 2023-11-08 ENCOUNTER — Encounter (HOSPITAL_BASED_OUTPATIENT_CLINIC_OR_DEPARTMENT_OTHER): Payer: Self-pay | Admitting: Physical Therapy

## 2023-11-08 ENCOUNTER — Ambulatory Visit (HOSPITAL_BASED_OUTPATIENT_CLINIC_OR_DEPARTMENT_OTHER): Payer: Medicare Other | Admitting: Physical Therapy

## 2023-11-08 DIAGNOSIS — M5459 Other low back pain: Secondary | ICD-10-CM | POA: Diagnosis not present

## 2023-11-08 DIAGNOSIS — G8929 Other chronic pain: Secondary | ICD-10-CM

## 2023-11-08 DIAGNOSIS — R2681 Unsteadiness on feet: Secondary | ICD-10-CM

## 2023-11-08 DIAGNOSIS — M25561 Pain in right knee: Secondary | ICD-10-CM | POA: Diagnosis not present

## 2023-11-08 DIAGNOSIS — M6281 Muscle weakness (generalized): Secondary | ICD-10-CM

## 2023-11-08 NOTE — Therapy (Signed)
 OUTPATIENT PHYSICAL THERAPY THORACOLUMBAR TREATMENT   Patient Name: Victor Barnes MRN: [id number] DOB:[birth year], 68 y.o., male Today's Date: 11/08/2023  END OF SESSION:  PT End of Session - 11/08/23 0758     Visit Number 8    Date for PT Re-Evaluation 11/25/23    Authorization Type [employer]BS medicare    Progress Note Due on Visit 10    PT Start Time 0800    PT Stop Time 0840    PT Time Calculation (min) 40 min    Activity Tolerance Patient tolerated treatment well    Behavior During Therapy [facility] for tasks assessed/performed               Past Medical History:  Diagnosis Date   Atrial fibrillation or flutter    Should be on Coumadin but h/o non-compliance; started Sotalol 08/2009   Cellulitis    Diabetes mellitus    DVT (deep venous thrombosis) (HCC)    Recurrent RLE DVT, supposed to be on Coumadin but h/o non-compliance    Dysrhythmia    Hyperlipidemia    Hypertension    Myocardiopathy (HCC)    OSA on CPAP    Pulmonary embolism (HCC)    Sleep apnea    Venous stasis    Past Surgical History:  Procedure Laterality Date   CATARACT EXTRACTION Bilateral    LEG SURGERY     REVERSE SHOULDER ARTHROPLASTY Left 04/21/2023   Procedure: REVERSE SHOULDER ARTHROPLASTY;  Surgeon: Francena Hanly, MD;  Location: [facility];  Service: Orthopedics;  Laterality: Left;    Patient Active Problem List   Diagnosis Date Noted   Permanent atrial fibrillation (HCC) 10/06/2021   Secondary hypercoagulable state (HCC) 10/06/2021   Venous stasis ulcer (HCC) 09/19/2015   Chronic anticoagulation 11/22/2014   Morbid obesity (HCC) 11/22/2014   Pulmonary embolism (HCC) 07/19/2011   Post-thrombotic syndrome 07/19/2011   Hypertension    Diabetes mellitus (HCC)    Cellulitis    Venous stasis    DVT (deep venous thrombosis) (HCC)    Sleep apnea    Abnormal electrocardiogram 10/03/2007   Type II or unspecified type diabetes mellitus without mention of complication, not stated as uncontrolled  10/03/2007   Primary hypertension 10/03/2007    PCP: Laurann Montana, MD   REFERRING PROVIDER: Laurann Montana, MD   REFERRING DIAG:  Other intervertebral disc degeneration, lumbar region with discogenic back pain and lower extremity pain  M17.11 (ICD-10-CM) - Osteoarthritis of right knee    Rationale for Evaluation and Treatment: Rehabilitation  THERAPY DIAG:  Chronic pain of right knee  Other low back pain  Muscle weakness (generalized)  Unsteadiness on feet  ONSET DATE: >1 yr with exacerbation  SUBJECTIVE:  SUBJECTIVE STATEMENT: Pt reports 0/10 lbp. Says he is "wonderful" Slight right knee pain.  POOL ACCESS: Pt's daughter building a pool this spring.  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   PERTINENT HISTORY:  Left reverse shoulder replacement Afib DM with peripheral neuropathies  PAIN:  Are you having pain? no: NPRS scale: 0/10  (took ibuprofen prior to session)  Pain location:  Pain description:  Aggravating factors: Static standing; walking x 10 minutes Relieving factors: AD; OTC   PAIN:  Are you having pain? Yes: NPRS scale: current 0/10; worst  5/10; 1-2/10 Pain location: sharp ache Pain description: right knee Aggravating factors: activity Relieving factors: sitting   PRECAUTIONS: None  RED FLAGS: None   WEIGHT BEARING RESTRICTIONS: No  FALLS:  Has patient fallen in last 6 months? No  LIVING ENVIRONMENT: Lives with: lives with their spouse Lives in: House/apartment Stairs: Yes: External: 4 steps; can reach both Has following equipment at home: Single point cane, Walker - 4 wheeled, Tour manager, and Grab bars  OCCUPATION: retired  PLOF: Independent  PATIENT GOALS: improve balance, gain strength  NEXT MD VISIT: as needed  OBJECTIVE:  Note:  Objective measures were completed at Evaluation unless otherwise noted.  DIAGNOSTIC FINDINGS:  X-ray left knee:IMPRESSION: Severe medial compartment osteoarthritis with bone-on-bone articulation. Small joint effusion.  PATIENT SURVEYS:  LEFS 41/80  COGNITION: Overall cognitive status: Within functional limits for tasks assessed     SENSATION: WFL  MUSCLE LENGTH: Hamstrings: tight tested in sitting  POSTURE:  Guarded wide BOS  PALPATION: Ms tightness throughout posterior core   LUMBAR ROM:   AROM eval 11/01/23  Flexion full   Extension neutral   Right lateral flexion 50% limited Full no P!  Left lateral flexion 50% limited Full no P!  Right rotation    Left rotation     (Blank rows = not tested)  LOWER EXTREMITY stre:     Strength (lbs) Right eval Left eval  Hip flexion 24.2 45.1  Hip extension    Hip abduction 56.4 59.4  Hip adduction    Hip internal rotation    Hip external rotation    Knee flexion    Knee extension 43.2 46.4  Ankle dorsiflexion    Ankle plantarflexion    Ankle inversion    Ankle eversion     (Blank rows = not tested)  LOWER EXTREMITY ROM:    ROM Right eval Left eval  Hip flexion    Hip extension    Hip abduction    Hip adduction    Hip internal rotation    Hip external rotation    Knee flexion 114   Knee extension 0   Ankle dorsiflexion    Ankle plantarflexion    Ankle inversion    Ankle eversion     (Blank rows = not tested)  LUMBAR SPECIAL TESTS:  Slump test: Negative  FUNCTIONAL TESTS:  30s STS: 4 from pool bench. UE required Timed up and go (TUG): 19.91 Berg Balance Scale: 24/56  GAIT: Distance walked: 400 ft Assistive device utilized: None Level of assistance: Modified independence Comments: increased BOS UE abd, slight LOB with turning. 2 standing rest periods hand on wall; audible SOB  TREATMENT  [facility] Adult PT Treatment:  DATE: 11/08/23 Pt seen for aquatic  therapy today.  Treatment took place in water 3.5-4.75 ft in depth at the [facility] pool. Temp of water was 91.  Pt entered/exited the pool independently via stairs in step-to pattern with UE on rail.  - unsupported: walking forward/ backward multiple laps -Farmers carry blue HB forward, back and side.  Cues for abd bracing - side stepping L/R with arm addct/ abdct with blue hand floats x 4 laps -tandem stance with ue add/abd blue HB 2 x 10 (core and glut engagement) -core rotator hold using yellow noodle chest deep x 8 -hip hinge VC x10, pause in L position for stretch. -resisted shoulder extension using rider band 2 x 10 3.42ft rail -resisted row using rider band x10, staggered stance R/L x5  -STS from 3rd water step. Decreased BOS for extra challenge->added ue holding ball for added challenge (difficult).   -cycling straddling noodle.  Pt requires the buoyancy and hydrostatic pressure of water for support, and to offload joints by unweighting joint load by at least 50 % in navel deep water and by at least 75-80% in chest to neck deep water.  Viscosity of the water is needed for resistance of strengthening. Water current perturbations provides challenge to standing balance requiring increased core activation.                                                                                                                           PATIENT EDUCATION:  Education details: intro to aquatic therapy Person educated: Patient  Education method: Explanation Education comprehension: verbalized understanding  HOME EXERCISE PROGRAM: Aquatic TBA  ASSESSMENT:  CLINICAL IMPRESSION: Pt ready to begin transitioning onto land based therapy.  He reports today no LBP and very minor right knee discomfort.  He did state he could feel some core muscles "waking up from nonuse" after last session but felt good.  Continued to progress core and le strengthening and balance in aquatic setting with  excellent toleration. Will relay on land based therapist to drive re-cert vs DC at end of cert        From initial evaluation: Patient is a 68 y.o. m who was seen today for physical therapy evaluation and treatment for LBP and right knee OA.  Pt presents with wife and without AD.  He reports long hx of LB and knee pain which a has exacerbated over the last 3 years. He reports progressive DM nueropathies affecting his balance. He has not had any past therapy for either issues.  Testing demonstrates that he is a high fall risk.  He has strength deficits throughout his core and LE. Gait deviation without use of AD with wide BOS.  He does not have pool access. He will benefit from skilled PT intervention to improve deficit in functional mobility, ADL's and safety.  OBJECTIVE IMPAIRMENTS: Abnormal gait, decreased activity tolerance, decreased balance, decreased endurance, decreased mobility, difficulty walking, decreased ROM, decreased strength, obesity, and pain.  ACTIVITY LIMITATIONS: carrying, lifting, bending, standing, squatting, stairs, transfers, and locomotion level  PARTICIPATION LIMITATIONS: meal prep, cleaning, shopping, community activity, occupation, and yard work  PERSONAL FACTORS: Time since onset of injury/illness/exacerbation and 3+ comorbidities: see PmHx  are also affecting patient's functional outcome.   REHAB POTENTIAL: Good  CLINICAL DECISION MAKING: Evolving/moderate complexity  EVALUATION COMPLEXITY: Moderate   GOALS: Goals reviewed with patient? Yes  SHORT TERM GOALS: Target date: 10/24/23  Pt will tolerate full aquatic sessions consistently without increase in pain and with improving function to demonstrate good toleration and effectiveness of intervention.  Baseline: Goal status: Met 10/21/23  2.  Pt will tolerate walking to and from setting and engaging in aquatic therapy session without excessive fatigue or increase in pain to demonstrate improved toleration to  activity.  Baseline:  Goal status: In Progress - 10/18/23; Met 11/01/23  3.  Pt will perform SLS in 3.6 ft x 15s or > unsupported to demonstrate improving balance Baseline:  Goal status: In progress 10/21/23  4.  Pt will improve lumbar side bending by 50% Baseline: see chart Goal status: Met 11/01/23   LONG TERM GOALS: Target date: 11/25/23  Pt to improve on LEFS by at least 9 point to demonstrate statistically significant Improvement in function. Baseline: 41/80 Goal status: INITIAL  2.  Pt will improve on 30s STS test to 7 or > to demonstrate improving functional lower extremity strength, transitional movements, and balance Baseline: 4 Goal status: INITIAL  3.  Pt will improve on Tug test to <or= 15s  to demonstrate improvement in lower extremity function, mobility and decreased fall risk. Baseline:19.91  Goal status: INITIAL  4.  Pt will improve on Berg balance test to >/= 43/56 to demonstrate a decrease in fall risk. Baseline:  Goal status: INITIAL  5.  Pt will improve strength in right hip flex to at least 5lb from contralateral side to demonstrate improved overall physical function Baseline: see chart Goal status: INITIAL  6.  Pt will have an improvement in right knee pain by 50%. Baseline: see chart Goal status: INITIAL  PLAN:  PT FREQUENCY: 2x/week  PT DURATION: 8 weeks first 8 session aquatic as able to schedule  PLANNED INTERVENTIONS: 97164- PT Re-evaluation, 97110-Therapeutic exercises, 97530- Therapeutic activity, 97112- Neuromuscular re-education, 97535- Self Care, 40981- Manual therapy, [phone]- Gait training, [phone]- Orthotic Fit/training, [phone]- Aquatic Therapy, 97014- Electrical stimulation (unattended), [phone]- Ionotophoresis 4mg /ml Dexamethasone, Patient/Family education, Balance training, Stair training, Taping, Dry Needling, Joint mobilization, DME instructions, Cryotherapy, and Moist heat.  PLAN FOR NEXT SESSION: Aquatics: RLE and core strengthening; balance  and gait retraining, stair climbing pain management Land: right LE strengthening, gait and balance training, stair climbing  Corrie Dandy [place]) Scotlynn Noyes MPT 11/08/23 7:59 AM [facility] [address] [place], [place], [address] Phone: [phone]   Fax:  [phone]

## 2023-11-09 DIAGNOSIS — E1122 Type 2 diabetes mellitus with diabetic chronic kidney disease: Secondary | ICD-10-CM | POA: Diagnosis not present

## 2023-11-09 DIAGNOSIS — Z6841 Body Mass Index (BMI) 40.0 and over, adult: Secondary | ICD-10-CM | POA: Diagnosis not present

## 2023-11-10 DIAGNOSIS — M51362 Other intervertebral disc degeneration, lumbar region with discogenic back pain and lower extremity pain: Secondary | ICD-10-CM | POA: Diagnosis not present

## 2023-11-11 ENCOUNTER — Encounter (HOSPITAL_BASED_OUTPATIENT_CLINIC_OR_DEPARTMENT_OTHER): Payer: Self-pay | Admitting: Physical Therapy

## 2023-11-11 ENCOUNTER — Ambulatory Visit (HOSPITAL_BASED_OUTPATIENT_CLINIC_OR_DEPARTMENT_OTHER): Payer: Medicare Other | Admitting: Physical Therapy

## 2023-11-11 ENCOUNTER — Encounter (HOSPITAL_BASED_OUTPATIENT_CLINIC_OR_DEPARTMENT_OTHER): Payer: Self-pay

## 2023-11-15 ENCOUNTER — Ambulatory Visit (HOSPITAL_BASED_OUTPATIENT_CLINIC_OR_DEPARTMENT_OTHER): Payer: Medicare Other | Attending: Family Medicine | Admitting: Physical Therapy

## 2023-11-15 ENCOUNTER — Encounter (HOSPITAL_BASED_OUTPATIENT_CLINIC_OR_DEPARTMENT_OTHER): Payer: Self-pay | Admitting: Physical Therapy

## 2023-11-15 DIAGNOSIS — R2681 Unsteadiness on feet: Secondary | ICD-10-CM | POA: Diagnosis not present

## 2023-11-15 DIAGNOSIS — M5459 Other low back pain: Secondary | ICD-10-CM | POA: Insufficient documentation

## 2023-11-15 DIAGNOSIS — G8929 Other chronic pain: Secondary | ICD-10-CM | POA: Insufficient documentation

## 2023-11-15 DIAGNOSIS — M6281 Muscle weakness (generalized): Secondary | ICD-10-CM | POA: Insufficient documentation

## 2023-11-15 DIAGNOSIS — M25561 Pain in right knee: Secondary | ICD-10-CM | POA: Insufficient documentation

## 2023-11-15 NOTE — Therapy (Signed)
 OUTPATIENT PHYSICAL THERAPY THORACOLUMBAR TREATMENT Progress Note Reporting Period 09/26/23 to 11/15/23  See note below for Objective Data and Assessment of Progress/Goals.      Patient Name: Victor Barnes MRN: [id number] DOB:14-Feb-1956, 68 y.o., male Today's Date: 11/15/2023  END OF SESSION:  PT End of Session - 11/15/23 0801     Visit Number 9    Date for PT Re-Evaluation 11/25/23    Authorization Type [employer] medicare    Progress Note Due on Visit 19    PT Start Time 0801    PT Stop Time 0840    PT Time Calculation (min) 39 min    Activity Tolerance Patient tolerated treatment well    Behavior During Therapy [facility] for tasks assessed/performed                Past Medical History:  Diagnosis Date   Atrial fibrillation or flutter    Should be on Coumadin but h/o non-compliance; started Sotalol 08/2009   Cellulitis    Diabetes mellitus    DVT (deep venous thrombosis) (HCC)    Recurrent RLE DVT, supposed to be on Coumadin but h/o non-compliance    Dysrhythmia    Hyperlipidemia    Hypertension    Myocardiopathy (HCC)    OSA on CPAP    Pulmonary embolism (HCC)    Sleep apnea    Venous stasis    Past Surgical History:  Procedure Laterality Date   CATARACT EXTRACTION Bilateral    LEG SURGERY     REVERSE SHOULDER ARTHROPLASTY Left 04/21/2023   Procedure: REVERSE SHOULDER ARTHROPLASTY;  Surgeon: Francena Hanly, MD;  Location: [facility];  Service: Orthopedics;  Laterality: Left;    Patient Active Problem List   Diagnosis Date Noted   Permanent atrial fibrillation (HCC) 10/06/2021   Secondary hypercoagulable state (HCC) 10/06/2021   Venous stasis ulcer (HCC) 09/19/2015   Chronic anticoagulation 11/22/2014   Morbid obesity (HCC) 11/22/2014   Pulmonary embolism (HCC) 07/19/2011   Post-thrombotic syndrome 07/19/2011   Hypertension    Diabetes mellitus (HCC)    Cellulitis    Venous stasis    DVT (deep venous thrombosis) (HCC)    Sleep apnea    Abnormal  electrocardiogram 10/03/2007   Type II or unspecified type diabetes mellitus without mention of complication, not stated as uncontrolled 10/03/2007   Primary hypertension 10/03/2007    PCP: Laurann Montana, MD   REFERRING PROVIDER: Laurann Montana, MD   REFERRING DIAG:  Other intervertebral disc degeneration, lumbar region with discogenic back pain and lower extremity pain  M17.11 (ICD-10-CM) - Osteoarthritis of right knee    Rationale for Evaluation and Treatment: Rehabilitation  THERAPY DIAG:  Chronic pain of right knee  Other low back pain  Muscle weakness (generalized)  Unsteadiness on feet  ONSET DATE: >1 yr with exacerbation  SUBJECTIVE:  SUBJECTIVE STATEMENT: Pt reports 0/10 pain  POOL ACCESS: Pt's daughter building a pool this spring.  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   PERTINENT HISTORY:  Left reverse shoulder replacement Afib DM with peripheral neuropathies  PAIN:  Are you having pain? no: NPRS scale: 0/10  (took ibuprofen prior to session)  Pain location:  Pain description:  Aggravating factors: Static standing; walking x 10 minutes Relieving factors: AD; OTC   PAIN:  Are you having pain? Yes: NPRS scale: current 0/10; worst  5/10; 1-2/10 Pain location: sharp ache Pain description: right knee Aggravating factors: activity Relieving factors: sitting   PRECAUTIONS: None  RED FLAGS: None   WEIGHT BEARING RESTRICTIONS: No  FALLS:  Has patient fallen in last 6 months? No  LIVING ENVIRONMENT: Lives with: lives with their spouse Lives in: House/apartment Stairs: Yes: External: 4 steps; can reach both Has following equipment at home: Single point cane, Walker - 4 wheeled, Tour manager, and Grab bars  OCCUPATION: retired  PLOF:  Independent  PATIENT GOALS: improve balance, gain strength  NEXT MD VISIT: as needed  OBJECTIVE:  Note: Objective measures were completed at Evaluation unless otherwise noted.  DIAGNOSTIC FINDINGS:  X-ray left knee:IMPRESSION: Severe medial compartment osteoarthritis with bone-on-bone articulation. Small joint effusion.  PATIENT SURVEYS:  LEFS 41/80  COGNITION: Overall cognitive status: Within functional limits for tasks assessed     SENSATION: WFL  MUSCLE LENGTH: Hamstrings: tight tested in sitting  POSTURE:  Guarded wide BOS  PALPATION: Ms tightness throughout posterior core   LUMBAR ROM:   AROM eval 11/01/23  Flexion full   Extension neutral   Right lateral flexion 50% limited Full no P!  Left lateral flexion 50% limited Full no P!  Right rotation    Left rotation     (Blank rows = not tested)  LOWER EXTREMITY stre:     Strength (lbs) Right eval Left eval  Hip flexion 24.2 45.1  Hip extension    Hip abduction 56.4 59.4  Hip adduction    Hip internal rotation    Hip external rotation    Knee flexion    Knee extension 43.2 46.4  Ankle dorsiflexion    Ankle plantarflexion    Ankle inversion    Ankle eversion     (Blank rows = not tested)  LOWER EXTREMITY ROM:    ROM Right eval Left eval  Hip flexion    Hip extension    Hip abduction    Hip adduction    Hip internal rotation    Hip external rotation    Knee flexion 114   Knee extension 0   Ankle dorsiflexion    Ankle plantarflexion    Ankle inversion    Ankle eversion     (Blank rows = not tested)  LUMBAR SPECIAL TESTS:  Slump test: Negative  FUNCTIONAL TESTS:  30s STS: 4 from pool bench. UE required Timed up and go (TUG): 19.91 Berg Balance Scale: 24/56  GAIT: Distance walked: 400 ft Assistive device utilized: None Level of assistance: Modified independence Comments: increased BOS UE abd, slight LOB with turning. 2 standing rest periods hand on wall; audible SOB  TREATMENT   [facility] Adult PT Treatment:                                                DATE: 11/15/23 Pt seen for  aquatic therapy today.  Treatment took place in water 3.5-4.75 ft in depth at the [facility] pool. Temp of water was 91.  Pt entered/exited the pool independently via stairs in step-to pattern with UE on rail.  - unsupported: walking forward/ backward multiple laps - side stepping L/R with arm addct/ abdct with blue hand floats x 4 laps -tandem stance (actually semi tandem as pt has difficulty getting into tandem due to body habitus)with ue add/abd blue HB 2 x 10 (core and glut engagement) in 3.80ft -L stretch -SLS ue support blue HB holding x 20s. Unsupported best R or L x 10s -hip hinge VC x10, pause in L position for stretch. -Farmers carry blue HB forward, back and side.  Cues for abd bracing -standing 4.6 ue support blue HB: hip flex/ext -cycling straddling noodle.  Pt requires the buoyancy and hydrostatic pressure of water for support, and to offload joints by unweighting joint load by at least 50 % in navel deep water and by at least 75-80% in chest to neck deep water.  Viscosity of the water is needed for resistance of strengthening. Water current perturbations provides challenge to standing balance requiring increased core activation.                                                                                                                           PATIENT EDUCATION:  Education details: intro to aquatic therapy Person educated: Patient  Education method: Explanation Education comprehension: verbalized understanding  HOME EXERCISE PROGRAM: Aquatic  This aquatic home exercise program from [employer] utilizes pictures from land based exercises, but has been adapted prior to lamination and issuance.   Access Code: [email or web address] URL: [email or web address] Date: 11/15/2023 Prepared by: Geni Bers  Exercises - Hand Buoy Carry  - 1 x daily - 1-3 x weekly  - 1-2 sets - 10 reps - Standing 'L' Stretch at [facility]  - 1 x daily - 71-3 x weekly - 3 reps - Standing Hip Hinge  - 1 x daily - 1-3 x weekly - 1-2 sets - 10 reps - Noodle press  - 1 x daily - 1-3 x weekly - 3 sets - 10 reps - Water Step Up on Bottom Step  - 1 x daily - 1-3 x weekly - 1-2 sets - 10 reps - Tandem Stance  - 1 x daily - 1-3 x weekly - 10-20s hold - Single Leg Stance  - 1 x daily - 1-3 x weekly - 10-20s hold - Standing Hip Flexion Extension at [employer]  - 1 x daily - 1-3 x weekly - 1 sets - 5 reps - Standing Hip Abduction  - 1 x daily - 1-3 x weekly - 1 sets - 5-10 reps - Heel Toe Raises at Pool Wall  - 1 x daily - 1-3 x weekly - 1-2 sets - 10 reps - Squat  - 1 x daily - 1-3 x weekly - 1-2 sets - 10 reps -  Seated Straddle on [employer] Breast Stroke Arms and Bicycle Legs  - 1 x daily - 1-3 x weekly Printed not issued  ASSESSMENT:  CLINICAL IMPRESSION: Final aquatic HEP completed but not yet issued.  He is directed through balance challenges with some difficulty. Does report some RLE discomfort with standing cross midline movement and RLE SLS.  Eliminated with cycling on noodle. Pt has made very good progress throughout plan of care.  He has met 3/4 STG and 2/6 LTGs as of today.  Reports almost complete reduction in LB and knee pain.  He has begun transitioning onto land based intervention. Plan to see in aquatics x 1 more to instruct on final aquatic HEP. Pt will be getting access to pool in next few weeks.  Will rely on land based therapist to ultimately decide on extending POC or DC.  I do anticipate he will have reached his max potential in aquatics as well as will have progress to reaching most if not all goals set.         From initial evaluation: Patient is a 68 y.o. m who was seen today for physical therapy evaluation and treatment for LBP and right knee OA.  Pt presents with wife and without AD.  He reports long hx of LB and knee pain which a has exacerbated  over the last 3 years. He reports progressive DM nueropathies affecting his balance. He has not had any past therapy for either issues.  Testing demonstrates that he is a high fall risk.  He has strength deficits throughout his core and LE. Gait deviation without use of AD with wide BOS.  He does not have pool access. He will benefit from skilled PT intervention to improve deficit in functional mobility, ADL's and safety.  OBJECTIVE IMPAIRMENTS: Abnormal gait, decreased activity tolerance, decreased balance, decreased endurance, decreased mobility, difficulty walking, decreased ROM, decreased strength, obesity, and pain.   ACTIVITY LIMITATIONS: carrying, lifting, bending, standing, squatting, stairs, transfers, and locomotion level  PARTICIPATION LIMITATIONS: meal prep, cleaning, shopping, community activity, occupation, and yard work  PERSONAL FACTORS: Time since onset of injury/illness/exacerbation and 3+ comorbidities: see PmHx  are also affecting patient's functional outcome.   REHAB POTENTIAL: Good  CLINICAL DECISION MAKING: Evolving/moderate complexity  EVALUATION COMPLEXITY: Moderate   GOALS: Goals reviewed with patient? Yes  SHORT TERM GOALS: Target date: 10/24/23  Pt will tolerate full aquatic sessions consistently without increase in pain and with improving function to demonstrate good toleration and effectiveness of intervention.  Baseline: Goal status: Met 10/21/23  2.  Pt will tolerate walking to and from setting and engaging in aquatic therapy session without excessive fatigue or increase in pain to demonstrate improved toleration to activity.  Baseline:  Goal status: In Progress - 10/18/23; Met 11/01/23  3.  Pt will perform SLS in 3.6 ft x 15s or > unsupported to demonstrate improving balance Baseline:  Goal status: In progress 10/21/23; 11/15/23  4.  Pt will improve lumbar side bending by 50% Baseline: see chart Goal status: Met 11/01/23   LONG TERM GOALS: Target date:  11/25/23  Pt to improve on LEFS by at least 9 point to demonstrate statistically significant Improvement in function. Baseline: 41/80; 50/80 Goal status: Met 11/15/23  2.  Pt will improve on 30s STS test to 7 or > to demonstrate improving functional lower extremity strength, transitional movements, and balance Baseline: 4 Goal status: INITIAL  3.  Pt will improve on Tug test to <or= 15s  to demonstrate improvement  in lower extremity function, mobility and decreased fall risk. Baseline:19.91  Goal status: INITIAL  4.  Pt will improve on Berg balance test to >/= 43/56 to demonstrate a decrease in fall risk. Baseline:  Goal status: INITIAL  5.  Pt will improve strength in right hip flex to at least 5lb from contralateral side to demonstrate improved overall physical function Baseline: see chart Goal status: INITIAL  6.  Pt will have an improvement in right knee pain by 50%. Baseline: see chart Goal status: met 11/15/23  PLAN:  PT FREQUENCY: 2x/week  PT DURATION: 8 weeks first 8 session aquatic as able to schedule  PLANNED INTERVENTIONS: 97164- PT Re-evaluation, 97110-Therapeutic exercises, 97530- Therapeutic activity, 97112- Neuromuscular re-education, 97535- Self Care, 54098- Manual therapy, [phone]- Gait training, [phone]- Orthotic Fit/training, [phone]- Aquatic Therapy, 97014- Electrical stimulation (unattended), [phone]- Ionotophoresis 4mg /ml Dexamethasone, Patient/Family education, Balance training, Stair training, Taping, Dry Needling, Joint mobilization, DME instructions, Cryotherapy, and Moist heat.  PLAN FOR NEXT SESSION: Aquatics: RLE and core strengthening; balance and gait retraining, stair climbing pain management Land: right LE strengthening, gait and balance training, stair climbing  Rushie Chestnut) Kayleb Warshaw MPT 11/15/23 12:29 PM [facility] [address] [place], [place], [address] Phone: [phone]   Fax:  [phone]

## 2023-11-17 NOTE — Therapy (Signed)
 OUTPATIENT PHYSICAL THERAPY THORACOLUMBAR TREATMENT   Patient Name: Victor Barnes MRN: [id number] DOB:[birth year], 68 y.o., male Today's Date: 11/18/2023  END OF SESSION:  PT End of Session - 11/18/23 0829     Visit Number 10    Date for PT Re-Evaluation 11/25/23    Authorization Type BCBS medicare    PT Start Time 0804    PT Stop Time 0846    PT Time Calculation (min) 42 min    Activity Tolerance Patient tolerated treatment well    Behavior During Therapy [facility] for tasks assessed/performed                Past Medical History:  Diagnosis Date   Atrial fibrillation or flutter    Should be on Coumadin but h/o non-compliance; started Sotalol 08/2009   Cellulitis    Diabetes mellitus    DVT (deep venous thrombosis) (HCC)    Recurrent RLE DVT, supposed to be on Coumadin but h/o non-compliance    Dysrhythmia    Hyperlipidemia    Hypertension    Myocardiopathy (HCC)    OSA on CPAP    Pulmonary embolism (HCC)    Sleep apnea    Venous stasis    Past Surgical History:  Procedure Laterality Date   CATARACT EXTRACTION Bilateral    LEG SURGERY     REVERSE SHOULDER ARTHROPLASTY Left 04/21/2023   Procedure: REVERSE SHOULDER ARTHROPLASTY;  Surgeon: Francena Hanly, MD;  Location: [facility];  Service: Orthopedics;  Laterality: Left;    Patient Active Problem List   Diagnosis Date Noted   Permanent atrial fibrillation (HCC) 10/06/2021   Secondary hypercoagulable state (HCC) 10/06/2021   Venous stasis ulcer (HCC) 09/19/2015   Chronic anticoagulation 11/22/2014   Morbid obesity (HCC) 11/22/2014   Pulmonary embolism (HCC) 07/19/2011   Post-thrombotic syndrome 07/19/2011   Hypertension    Diabetes mellitus (HCC)    Cellulitis    Venous stasis    DVT (deep venous thrombosis) (HCC)    Sleep apnea    Abnormal electrocardiogram 10/03/2007   Type II or unspecified type diabetes mellitus without mention of complication, not stated as uncontrolled 10/03/2007   Primary  hypertension 10/03/2007    PCP: Laurann Montana, MD   REFERRING PROVIDER: Laurann Montana, MD   REFERRING DIAG:  Other intervertebral disc degeneration, lumbar region with discogenic back pain and lower extremity pain  M17.11 (ICD-10-CM) - Osteoarthritis of right knee    Rationale for Evaluation and Treatment: Rehabilitation  THERAPY DIAG:  Chronic pain of right knee  Other low back pain  Muscle weakness (generalized)  Unsteadiness on feet  ONSET DATE: >1 yr with exacerbation  SUBJECTIVE:  SUBJECTIVE STATEMENT: Pt reports improved balance.  He reports improved performance of sit to stand transfers and is able to stand up without using his hands.  He denies pain currently.  Pt reports improved lifting of R LE to don pants.  Pt reports a 60% improvement in R knee pain.  Pt states he has done well in the pool.    POOL ACCESS: Pt's daughter building a pool this spring.  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   PERTINENT HISTORY:  Left reverse shoulder replacement Afib DM with peripheral neuropathies  PAIN:  Are you having pain? no: NPRS scale: 0/10  (took ibuprofen prior to session)  Pain location: lumbar and R knee Pain description:  Aggravating factors: Static standing; walking x 10 minutes Relieving factors: AD; OTC   PAIN:  Are you having pain? Yes: NPRS scale: current 0/10; worst  5/10; 1-2/10 Pain location: sharp ache Pain description: right knee Aggravating factors: activity Relieving factors: sitting   PRECAUTIONS: None  RED FLAGS: None   WEIGHT BEARING RESTRICTIONS: No  FALLS:  Has patient fallen in last 6 months? No  LIVING ENVIRONMENT: Lives with: lives with their spouse Lives in: House/apartment Stairs: Yes: External: 4 steps; can reach both Has  following equipment at home: Single point cane, Walker - 4 wheeled, Tour manager, and Grab bars  OCCUPATION: retired  PLOF: Independent  PATIENT GOALS: improve balance, gain strength  NEXT MD VISIT: as needed  OBJECTIVE:  Note: Objective measures were completed at Evaluation unless otherwise noted.  DIAGNOSTIC FINDINGS:  X-ray left knee:IMPRESSION: Severe medial compartment osteoarthritis with bone-on-bone articulation. Small joint effusion.  PATIENT SURVEYS:  LEFS 41/80  COGNITION: Overall cognitive status: Within functional limits for tasks assessed     SENSATION: WFL  MUSCLE LENGTH: Hamstrings: tight tested in sitting  POSTURE:  Guarded wide BOS  PALPATION: Ms tightness throughout posterior core   LUMBAR ROM:   AROM eval 11/01/23  Flexion full   Extension neutral   Right lateral flexion 50% limited Full no P!  Left lateral flexion 50% limited Full no P!  Right rotation    Left rotation     (Blank rows = not tested)  LOWER EXTREMITY stre:     Strength (lbs) Right eval Left eval Right 4/4 Left 4/4  Hip flexion 24.2 45.1 44.6   Hip extension      Hip abduction 56.4 59.4    Hip adduction      Hip internal rotation      Hip external rotation      Knee flexion      Knee extension 43.2 46.4 53.3 47.8  Ankle dorsiflexion      Ankle plantarflexion      Ankle inversion      Ankle eversion       (Blank rows = not tested)  LOWER EXTREMITY ROM:    ROM Right eval Left eval  Hip flexion    Hip extension    Hip abduction    Hip adduction    Hip internal rotation    Hip external rotation    Knee flexion 114   Knee extension 0   Ankle dorsiflexion    Ankle plantarflexion    Ankle inversion    Ankle eversion     (Blank rows = not tested)  LUMBAR SPECIAL TESTS:  Slump test: Negative  FUNCTIONAL TESTS:  30s STS: 4 from pool bench. UE required Timed up and go (TUG): 19.91 Berg Balance Scale: 24/56   11/18/23: TUG:  13.06 sec without  AD  GAIT: Distance walked: 400 ft Assistive device utilized: None Level of assistance: Modified independence Comments: increased BOS UE abd, slight LOB with turning. 2 standing rest periods hand on wall; audible SOB  TREATMENT  4/4   Assessed hip/knee strength and TUG.  PT educated pt with correct performance and palpation of TrA contraction.  Pt performed supine TrA contractions with and without 5 sec hold. Supine marching with TrA contraction x 10 reps  Seated clams with TrA contraction with GTB 2x10 LAQ 2x10 bilat  PT gave pt a HEP handout and educated pt in correct form and appropriate frequency.  See below for pt education                                                                                                                        PATIENT EDUCATION:  Education details:  objective findings, goal progress, HEP, exercise form, and POC. Person educated: Patient  Education method: Explanation, demonstration, verbal and tactile cues, handout Education comprehension: verbalized understanding, returned demonstration, verbal and tactile cues required  HOME EXERCISE PROGRAM: Access Code: [email or web address] URL: [email or web address] Date: 11/18/2023 Prepared by: Aaron Edelman  Exercises - Supine Transversus Abdominis Bracing - Hands on Stomach  - 2 x daily - 7 x weekly - 2 sets - 10 reps - 5 seconds hold - Supine March  - 1 x daily - 7 x weekly - 2 sets - 10 reps - Seated Hip Abduction with Resistance  - 1 x daily - 4-5 x weekly - 2 sets - 10 reps - Seated Long Arc Quad  - 1 x daily - 7 x weekly - 2 sets - 10 reps  ASSESSMENT:  CLINICAL IMPRESSION: Pt enters clinic with 4 wheeled walker.  Pt is making good progress as evidenced by subjective reports and objective findings.  He reports improved balance, sit to stand transfers, and dressing.  Pt reports a 60% improvement in R knee pain.  Pt reports he has been doing well with aquatic therapy.  Pt demonstrates a  good improvement in R hip flexion and R knee extension strength as evidenced by HHD testing.  Pt demonstrates improved TUG from 19.9 sec initially to 13.06 sec currently.  PT educated pt with TrA contractions and established land based HEP today.  Pt tolerated land based exercises well and performed them well with cuing and instruction in HEP.  PT gave pt a HEP handout and pt demonstrates good understanding.   Pt met LTG's #3,5,6.  He should benefit from cont skilled PT to address ongoing goals and improve overall function.    OBJECTIVE IMPAIRMENTS: Abnormal gait, decreased activity tolerance, decreased balance, decreased endurance, decreased mobility, difficulty walking, decreased ROM, decreased strength, obesity, and pain.   ACTIVITY LIMITATIONS: carrying, lifting, bending, standing, squatting, stairs, transfers, and locomotion level  PARTICIPATION LIMITATIONS: meal prep, cleaning, shopping, community activity, occupation, and yard work  PERSONAL FACTORS: Time since onset of injury/illness/exacerbation and 3+ comorbidities: see PmHx  are  also affecting patient's functional outcome.   REHAB POTENTIAL: Good  CLINICAL DECISION MAKING: Evolving/moderate complexity  EVALUATION COMPLEXITY: Moderate   GOALS: Goals reviewed with patient? Yes  SHORT TERM GOALS: Target date: 10/24/23  Pt will tolerate full aquatic sessions consistently without increase in pain and with improving function to demonstrate good toleration and effectiveness of intervention.  Baseline: Goal status: Met 10/21/23  2.  Pt will tolerate walking to and from setting and engaging in aquatic therapy session without excessive fatigue or increase in pain to demonstrate improved toleration to activity.  Baseline:  Goal status: In Progress - 10/18/23; Met 11/01/23  3.  Pt will perform SLS in 3.6 ft x 15s or > unsupported to demonstrate improving balance Baseline:  Goal status: In progress 10/21/23  4.  Pt will improve lumbar side  bending by 50% Baseline: see chart Goal status: Met 11/01/23   LONG TERM GOALS: Target date: 11/25/23  Pt to improve on LEFS by at least 9 point to demonstrate statistically significant Improvement in function. Baseline: 41/80 Goal status: INITIAL  2.  Pt will improve on 30s STS test to 7 or > to demonstrate improving functional lower extremity strength, transitional movements, and balance Baseline: 4 Goal status: INITIAL  3.  Pt will improve on Tug test to <or= 15s  to demonstrate improvement in lower extremity function, mobility and decreased fall risk. Baseline:19.91  / Current:  13.06 Goal status: GOAL MET  4/4  4.  Pt will improve on Berg balance test to >/= 43/56 to demonstrate a decrease in fall risk. Baseline:  Goal status: INITIAL  5.  Pt will improve strength in right hip flex to at least 5lb from contralateral side to demonstrate improved overall physical function Baseline: see chart Goal status: GOAL MET  4/4  6.  Pt will have an improvement in right knee pain by 50%. Baseline: see chart Goal status:  GOAL MET  4/4  PLAN:  PT FREQUENCY: 2x/week  PT DURATION: 8 weeks first 8 session aquatic as able to schedule  PLANNED INTERVENTIONS: 97164- PT Re-evaluation, 97110-Therapeutic exercises, 97530- Therapeutic activity, 97112- Neuromuscular re-education, 97535- Self Care, 16109- Manual therapy, [phone]- Gait training, [phone]- Orthotic Fit/training, [phone]- Aquatic Therapy, 97014- Electrical stimulation (unattended), [phone]- Ionotophoresis 4mg /ml Dexamethasone, Patient/Family education, Balance training, Stair training, Taping, Dry Needling, Joint mobilization, DME instructions, Cryotherapy, and Moist heat.  PLAN FOR NEXT SESSION: Aquatics: RLE and core strengthening; balance and gait retraining, stair climbing pain management Land: right LE strengthening, gait and balance training, stair climbing Consider supine heel slides with TrA, sit to stands from table, standing  marching, and standing on airex next land visit.  Audie Clear III PT, DPT 11/18/23 9:12 AM

## 2023-11-18 ENCOUNTER — Encounter (HOSPITAL_BASED_OUTPATIENT_CLINIC_OR_DEPARTMENT_OTHER): Payer: Self-pay | Admitting: Physical Therapy

## 2023-11-18 ENCOUNTER — Ambulatory Visit (HOSPITAL_BASED_OUTPATIENT_CLINIC_OR_DEPARTMENT_OTHER): Payer: Medicare Other | Admitting: Physical Therapy

## 2023-11-18 DIAGNOSIS — M6281 Muscle weakness (generalized): Secondary | ICD-10-CM | POA: Diagnosis not present

## 2023-11-18 DIAGNOSIS — R2681 Unsteadiness on feet: Secondary | ICD-10-CM | POA: Diagnosis not present

## 2023-11-18 DIAGNOSIS — G8929 Other chronic pain: Secondary | ICD-10-CM | POA: Diagnosis not present

## 2023-11-18 DIAGNOSIS — M25561 Pain in right knee: Secondary | ICD-10-CM | POA: Diagnosis not present

## 2023-11-18 DIAGNOSIS — M5459 Other low back pain: Secondary | ICD-10-CM

## 2023-11-22 ENCOUNTER — Encounter (HOSPITAL_BASED_OUTPATIENT_CLINIC_OR_DEPARTMENT_OTHER): Payer: Self-pay | Admitting: Physical Therapy

## 2023-11-22 ENCOUNTER — Ambulatory Visit (HOSPITAL_BASED_OUTPATIENT_CLINIC_OR_DEPARTMENT_OTHER): Payer: Medicare Other | Admitting: Physical Therapy

## 2023-11-22 DIAGNOSIS — R2681 Unsteadiness on feet: Secondary | ICD-10-CM

## 2023-11-22 DIAGNOSIS — M5459 Other low back pain: Secondary | ICD-10-CM | POA: Diagnosis not present

## 2023-11-22 DIAGNOSIS — M6281 Muscle weakness (generalized): Secondary | ICD-10-CM | POA: Diagnosis not present

## 2023-11-22 DIAGNOSIS — G8929 Other chronic pain: Secondary | ICD-10-CM

## 2023-11-22 DIAGNOSIS — M25561 Pain in right knee: Secondary | ICD-10-CM | POA: Diagnosis not present

## 2023-11-22 NOTE — Therapy (Signed)
 OUTPATIENT PHYSICAL THERAPY THORACOLUMBAR TREATMENT   Patient Name: Victor Barnes MRN: [id number] DOB:15-Sep-1955, 68 y.o., male Today's Date: 11/22/2023  END OF SESSION:  PT End of Session - 11/22/23 0758     Visit Number 11    Date for PT Re-Evaluation 11/25/23    Authorization Type BCBS medicare    PT Start Time 0800    PT Stop Time 0840    PT Time Calculation (min) 40 min    Activity Tolerance Patient tolerated treatment well    Behavior During Therapy [facility] for tasks assessed/performed                Past Medical History:  Diagnosis Date   Atrial fibrillation or flutter    Should be on Coumadin but h/o non-compliance; started Sotalol 08/2009   Cellulitis    Diabetes mellitus    DVT (deep venous thrombosis) (HCC)    Recurrent RLE DVT, supposed to be on Coumadin but h/o non-compliance    Dysrhythmia    Hyperlipidemia    Hypertension    Myocardiopathy (HCC)    OSA on CPAP    Pulmonary embolism (HCC)    Sleep apnea    Venous stasis    Past Surgical History:  Procedure Laterality Date   CATARACT EXTRACTION Bilateral    LEG SURGERY     REVERSE SHOULDER ARTHROPLASTY Left 04/21/2023   Procedure: REVERSE SHOULDER ARTHROPLASTY;  Surgeon: Francena Hanly, MD;  Location: [facility];  Service: Orthopedics;  Laterality: Left;    Patient Active Problem List   Diagnosis Date Noted   Permanent atrial fibrillation (HCC) 10/06/2021   Secondary hypercoagulable state (HCC) 10/06/2021   Venous stasis ulcer (HCC) 09/19/2015   Chronic anticoagulation 11/22/2014   Morbid obesity (HCC) 11/22/2014   Pulmonary embolism (HCC) 07/19/2011   Post-thrombotic syndrome 07/19/2011   Hypertension    Diabetes mellitus (HCC)    Cellulitis    Venous stasis    DVT (deep venous thrombosis) (HCC)    Sleep apnea    Abnormal electrocardiogram 10/03/2007   Type II or unspecified type diabetes mellitus without mention of complication, not stated as uncontrolled 10/03/2007   Primary  hypertension 10/03/2007    PCP: Laurann Montana, MD   REFERRING PROVIDER: Laurann Montana, MD   REFERRING DIAG:  Other intervertebral disc degeneration, lumbar region with discogenic back pain and lower extremity pain  M17.11 (ICD-10-CM) - Osteoarthritis of right knee    Rationale for Evaluation and Treatment: Rehabilitation  THERAPY DIAG:  Chronic pain of right knee  Other low back pain  Muscle weakness (generalized)  Unsteadiness on feet  ONSET DATE: >1 yr with exacerbation  SUBJECTIVE:  SUBJECTIVE STATEMENT: Pt reports good response to 1st land based intervention. He reports compliance with home program which he feels is a good program.  No pain reported. He is aware and in agreement with final aquatic session.   POOL ACCESS: Pt's daughter building a pool this spring.  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   PERTINENT HISTORY:  Left reverse shoulder replacement Afib DM with peripheral neuropathies  PAIN:  Are you having pain? no: NPRS scale: 0/10  (took ibuprofen prior to session)  Pain location: lumbar and R knee Pain description:  Aggravating factors: Static standing; walking x 10 minutes Relieving factors: AD; OTC   PAIN:  Are you having pain? Yes: NPRS scale: current 0/10; worst  5/10; 1-2/10 Pain location: sharp ache Pain description: right knee Aggravating factors: activity Relieving factors: sitting   PRECAUTIONS: None  RED FLAGS: None   WEIGHT BEARING RESTRICTIONS: No  FALLS:  Has patient fallen in last 6 months? No  LIVING ENVIRONMENT: Lives with: lives with their spouse Lives in: House/apartment Stairs: Yes: External: 4 steps; can reach both Has following equipment at home: Single point cane, Walker - 4 wheeled, Tour manager, and Grab  bars  OCCUPATION: retired  PLOF: Independent  PATIENT GOALS: improve balance, gain strength  NEXT MD VISIT: as needed  OBJECTIVE:  Note: Objective measures were completed at Evaluation unless otherwise noted.  DIAGNOSTIC FINDINGS:  X-ray left knee:IMPRESSION: Severe medial compartment osteoarthritis with bone-on-bone articulation. Small joint effusion.  PATIENT SURVEYS:  LEFS 41/80  COGNITION: Overall cognitive status: Within functional limits for tasks assessed     SENSATION: WFL  MUSCLE LENGTH: Hamstrings: tight tested in sitting  POSTURE:  Guarded wide BOS  PALPATION: Ms tightness throughout posterior core   LUMBAR ROM:   AROM eval 11/01/23  Flexion full   Extension neutral   Right lateral flexion 50% limited Full no P!  Left lateral flexion 50% limited Full no P!  Right rotation    Left rotation     (Blank rows = not tested)  LOWER EXTREMITY stre:     Strength (lbs) Right eval Left eval Right 4/4 Left 4/4  Hip flexion 24.2 45.1 44.6   Hip extension      Hip abduction 56.4 59.4    Hip adduction      Hip internal rotation      Hip external rotation      Knee flexion      Knee extension 43.2 46.4 53.3 47.8  Ankle dorsiflexion      Ankle plantarflexion      Ankle inversion      Ankle eversion       (Blank rows = not tested)  LOWER EXTREMITY ROM:    ROM Right eval Left eval  Hip flexion    Hip extension    Hip abduction    Hip adduction    Hip internal rotation    Hip external rotation    Knee flexion 114   Knee extension 0   Ankle dorsiflexion    Ankle plantarflexion    Ankle inversion    Ankle eversion     (Blank rows = not tested)  LUMBAR SPECIAL TESTS:  Slump test: Negative  FUNCTIONAL TESTS:  30s STS: 4 from pool bench. UE required Timed up and go (TUG): 19.91 Berg Balance Scale: 24/56   11/18/23: TUG:  13.06 sec without AD  GAIT: Distance walked: 400 ft Assistive device utilized: None Level of assistance: Modified  independence Comments: increased BOS UE  abd, slight LOB with turning. 2 standing rest periods hand on wall; audible SOB  TREATMENT  TREATMENT  [facility] Adult PT Treatment:                                                DATE: 11/22/23 Pt seen for aquatic therapy today.  Treatment took place in water 3.5-4.75 ft in depth at the [facility] pool. Temp of water was 91.  Pt entered/exited the pool independently via stairs in step-to pattern with UE on rail.     Exercises - Hand Buoy Carry   - Standing 'L' Stretch at [employer]   - Standing Hip Hinge   - Noodle press   - Water Step Up on Bottom Step   - Tandem Stance  - Single Leg Stance  - Standing Hip Flexion Extension at [employer]   - Standing Hip Abduction - Heel Toe Raises at [employer]   - Squat   - Seated Straddle on Flotation Forward Breast Stroke Arms and Bicycle Legs  Pt requires the buoyancy and hydrostatic pressure of water for support, and to offload joints by unweighting joint load by at least 50 % in navel deep water and by at least 75-80% in chest to neck deep water.  Viscosity of the water is needed for resistance of strengthening. Water current perturbations provides challenge to standing balance requiring increased core activation.    4/4   Assessed hip/knee strength and TUG.  PT educated pt with correct performance and palpation of TrA contraction.  Pt performed supine TrA contractions with and without 5 sec hold. Supine marching with TrA contraction x 10 reps  Seated clams with TrA contraction with GTB 2x10 LAQ 2x10 bilat  PT gave pt a HEP handout and educated pt in correct form and appropriate frequency.  See below for pt education                                                                                                                        PATIENT EDUCATION:  Education details:  objective findings, goal progress, HEP, exercise form, and POC. Person educated: Patient  Education method:  Explanation, demonstration, verbal and tactile cues, handout Education comprehension: verbalized understanding, returned demonstration, verbal and tactile cues required  HOME EXERCISE PROGRAM: Access Code: [email or web address] URL: [email or web address] Date: 11/15/2023 Prepared by: Mosetta Anis and issued   Access Code: [facility] URL: [email or web address] Date: 11/18/2023 Prepared by: Aaron Edelman  Exercises - Supine Transversus Abdominis Bracing - Hands on Stomach  - 2 x daily - 7 x weekly - 2 sets - 10 reps - 5 seconds hold - Supine March  - 1 x daily - 7 x weekly - 2 sets - 10 reps - Seated Hip Abduction with Resistance  - 1 x daily - 4-5 x weekly - 2 sets - 10 reps -  Seated Long Arc Quad  - 1 x daily - 7 x weekly - 2 sets - 10 reps  ASSESSMENT:  CLINICAL IMPRESSION: Pt instructed in final aquatic HEP.  Program is laminated and issued.  He has membership here at [employer].  He is given VC for execution and written clarifications on program itself. He demonstrates and verbalized understanding. He has reached his max potential in this setting.  Ha 1 more land based session.  He has met a majority of his goals.  Pt aware of potential dc unless land based therapist feels further intervention is warranted.    OBJECTIVE IMPAIRMENTS: Abnormal gait, decreased activity tolerance, decreased balance, decreased endurance, decreased mobility, difficulty walking, decreased ROM, decreased strength, obesity, and pain.   ACTIVITY LIMITATIONS: carrying, lifting, bending, standing, squatting, stairs, transfers, and locomotion level  PARTICIPATION LIMITATIONS: meal prep, cleaning, shopping, community activity, occupation, and yard work  PERSONAL FACTORS: Time since onset of injury/illness/exacerbation and 3+ comorbidities: see PmHx  are also affecting patient's functional outcome.   REHAB POTENTIAL: Good  CLINICAL DECISION MAKING: Evolving/moderate  complexity  EVALUATION COMPLEXITY: Moderate   GOALS: Goals reviewed with patient? Yes  SHORT TERM GOALS: Target date: 10/24/23  Pt will tolerate full aquatic sessions consistently without increase in pain and with improving function to demonstrate good toleration and effectiveness of intervention.  Baseline: Goal status: Met 10/21/23  2.  Pt will tolerate walking to and from setting and engaging in aquatic therapy session without excessive fatigue or increase in pain to demonstrate improved toleration to activity.  Baseline:  Goal status: In Progress - 10/18/23; Met 11/01/23  3.  Pt will perform SLS in 3.6 ft x 15s or > unsupported to demonstrate improving balance Baseline:  Goal status: In progress 10/21/23  4.  Pt will improve lumbar side bending by 50% Baseline: see chart Goal status: Met 11/01/23   LONG TERM GOALS: Target date: 11/25/23  Pt to improve on LEFS by at least 9 point to demonstrate statistically significant Improvement in function. Baseline: 41/80 Goal status: INITIAL  2.  Pt will improve on 30s STS test to 7 or > to demonstrate improving functional lower extremity strength, transitional movements, and balance Baseline: 4 Goal status: INITIAL  3.  Pt will improve on Tug test to <or= 15s  to demonstrate improvement in lower extremity function, mobility and decreased fall risk. Baseline:19.91  / Current:  13.06 Goal status: GOAL MET  4/4  4.  Pt will improve on Berg balance test to >/= 43/56 to demonstrate a decrease in fall risk. Baseline:  Goal status: INITIAL  5.  Pt will improve strength in right hip flex to at least 5lb from contralateral side to demonstrate improved overall physical function Baseline: see chart Goal status: GOAL MET  4/4  6.  Pt will have an improvement in right knee pain by 50%. Baseline: see chart Goal status:  GOAL MET  4/4  PLAN:  PT FREQUENCY: 2x/week  PT DURATION: 8 weeks first 8 session aquatic as able to schedule  PLANNED  INTERVENTIONS: 97164- PT Re-evaluation, 97110-Therapeutic exercises, 97530- Therapeutic activity, 97112- Neuromuscular re-education, 97535- Self Care, 16109- Manual therapy, [phone]- Gait training, [phone]- Orthotic Fit/training, [phone]- Aquatic Therapy, 97014- Electrical stimulation (unattended), [phone]- Ionotophoresis 4mg /ml Dexamethasone, Patient/Family education, Balance training, Stair training, Taping, Dry Needling, Joint mobilization, DME instructions, Cryotherapy, and Moist heat.  PLAN FOR NEXT SESSION: Aquatics: RLE and core strengthening; balance and gait retraining, stair climbing pain management Land: right LE strengthening, gait and balance training, stair  climbing Consider supine heel slides with TrA, sit to stands from table, standing marching, and standing on airex next land visit.  [address] [place]) Rudransh Bellanca MPT 11/22/23 9:25 AM [facility] [address] [place], [place], [address] Phone: [phone]   Fax:  [phone]

## 2023-11-25 ENCOUNTER — Encounter (HOSPITAL_BASED_OUTPATIENT_CLINIC_OR_DEPARTMENT_OTHER): Payer: Self-pay | Admitting: Physical Therapy

## 2023-11-25 ENCOUNTER — Ambulatory Visit (HOSPITAL_BASED_OUTPATIENT_CLINIC_OR_DEPARTMENT_OTHER): Payer: Medicare Other | Admitting: Physical Therapy

## 2023-11-25 DIAGNOSIS — G8929 Other chronic pain: Secondary | ICD-10-CM | POA: Diagnosis not present

## 2023-11-25 DIAGNOSIS — M6281 Muscle weakness (generalized): Secondary | ICD-10-CM

## 2023-11-25 DIAGNOSIS — R2681 Unsteadiness on feet: Secondary | ICD-10-CM

## 2023-11-25 DIAGNOSIS — M5459 Other low back pain: Secondary | ICD-10-CM | POA: Diagnosis not present

## 2023-11-25 DIAGNOSIS — M25561 Pain in right knee: Secondary | ICD-10-CM | POA: Diagnosis not present

## 2023-11-25 NOTE — Therapy (Signed)
 OUTPATIENT PHYSICAL THERAPY THORACOLUMBAR TREATMENT /  PT DISCHARGE   Patient Name: Victor Barnes MRN: [id number] DOB:15-Jul-1956, 68 y.o., male Today's Date: 11/25/2023  END OF SESSION:  PT End of Session - 11/25/23 0841     Visit Number 12    Number of Visits 12    Date for PT Re-Evaluation 11/25/23    Authorization Type [employer] medicare    PT Start Time 0805    PT Stop Time 0854    PT Time Calculation (min) 49 min    Activity Tolerance Patient tolerated treatment well    Behavior During Therapy [facility] for tasks assessed/performed                 Past Medical History:  Diagnosis Date   Atrial fibrillation or flutter    Should be on Coumadin but h/o non-compliance; started Sotalol 08/2009   Cellulitis    Diabetes mellitus    DVT (deep venous thrombosis) (HCC)    Recurrent RLE DVT, supposed to be on Coumadin but h/o non-compliance    Dysrhythmia    Hyperlipidemia    Hypertension    Myocardiopathy (HCC)    OSA on CPAP    Pulmonary embolism (HCC)    Sleep apnea    Venous stasis    Past Surgical History:  Procedure Laterality Date   CATARACT EXTRACTION Bilateral    LEG SURGERY     REVERSE SHOULDER ARTHROPLASTY Left 04/21/2023   Procedure: REVERSE SHOULDER ARTHROPLASTY;  Surgeon: Francena Hanly, MD;  Location: [facility];  Service: Orthopedics;  Laterality: Left;    Patient Active Problem List   Diagnosis Date Noted   Permanent atrial fibrillation (HCC) 10/06/2021   Secondary hypercoagulable state (HCC) 10/06/2021   Venous stasis ulcer (HCC) 09/19/2015   Chronic anticoagulation 11/22/2014   Morbid obesity (HCC) 11/22/2014   Pulmonary embolism (HCC) 07/19/2011   Post-thrombotic syndrome 07/19/2011   Hypertension    Diabetes mellitus (HCC)    Cellulitis    Venous stasis    DVT (deep venous thrombosis) (HCC)    Sleep apnea    Abnormal electrocardiogram 10/03/2007   Type II or unspecified type diabetes mellitus without mention of complication, not stated as  uncontrolled 10/03/2007   Primary hypertension 10/03/2007    PCP: Laurann Montana, MD   REFERRING PROVIDER: Laurann Montana, MD   REFERRING DIAG:  Other intervertebral disc degeneration, lumbar region with discogenic back pain and lower extremity pain  M17.11 (ICD-10-CM) - Osteoarthritis of right knee    Rationale for Evaluation and Treatment: Rehabilitation  THERAPY DIAG:  Chronic pain of right knee  Other low back pain  Muscle weakness (generalized)  Unsteadiness on feet  ONSET DATE: >1 yr with exacerbation  SUBJECTIVE:  SUBJECTIVE STATEMENT: Pt states he felt good after prior land based treatment.  He denies any adverse effects after prior Rx.  Pt states he finished up his pool therapy last visit and received an aquatic program handout.  Pt plans to join [employer].  He denies pain currently.  He reports compliance with HEP and had no issues performing them.  Pt reports good improvement with aquatic therapy.    PERTINENT HISTORY:  Left reverse shoulder replacement Afib DM with peripheral neuropathies  PAIN:  Are you having pain? no: NPRS scale: 0/10  (took ibuprofen prior to session)  Pain location: lumbar and R knee Pain description:  Aggravating factors: Static standing; walking x 10 minutes Relieving factors: AD; OTC   PAIN:  Are you having pain? Yes: NPRS scale: current 0/10; worst  5/10; 1-2/10 Pain location: sharp ache Pain description: right knee Aggravating factors: activity Relieving factors: sitting   PRECAUTIONS: None  RED FLAGS: None   WEIGHT BEARING RESTRICTIONS: No  FALLS:  Has patient fallen in last 6 months? No  LIVING ENVIRONMENT: Lives with: lives with their spouse Lives in: House/apartment Stairs: Yes: External: 4 steps; can reach both Has following  equipment at home: Single point cane, Walker - 4 wheeled, Tour manager, and Grab bars  OCCUPATION: retired  PLOF: Independent  PATIENT GOALS: improve balance, gain strength  NEXT MD VISIT: as needed  OBJECTIVE:  Note: Objective measures were completed at Evaluation unless otherwise noted.  DIAGNOSTIC FINDINGS:  X-ray left knee:IMPRESSION: Severe medial compartment osteoarthritis with bone-on-bone articulation. Small joint effusion.  PATIENT SURVEYS:  LEFS Initial/Current:  41/80 / 51/80   LOWER EXTREMITY stre:     Strength (lbs) Right eval Left eval Right 4/4 Left 4/4  Hip flexion 24.2 45.1 44.6   Hip extension      Hip abduction 56.4 59.4    Hip adduction      Hip internal rotation      Hip external rotation      Knee flexion      Knee extension 43.2 46.4 53.3 47.8  Ankle dorsiflexion      Ankle plantarflexion      Ankle inversion      Ankle eversion       (Blank rows = not tested)  LOWER EXTREMITY ROM:    ROM Right eval Left eval Right 4/11  Hip flexion     Hip extension     Hip abduction     Hip adduction     Hip internal rotation     Hip external rotation     Knee flexion 114  114  Knee extension 0    Ankle dorsiflexion     Ankle plantarflexion     Ankle inversion     Ankle eversion      (Blank rows = not tested)  LUMBAR SPECIAL TESTS:  Slump test: Negative  FUNCTIONAL TESTS:  30s STS: 4 from pool bench. UE required Timed up and go (TUG): 19.91 Berg Balance Scale: 24/56   11/18/23: TUG:  13.06 sec without AD   TREATMENT  4/11  Nustep UE/LE's lvl 4   30s STS test: 10 reps without UE's  PT educated pt with correct performance and palpation of TrA contraction.  Pt performed supine TrA contractions with 5 sec hold. Supine marching with TrA contraction 2 x 10 reps Supine heel slides with TrA 2x10  Seated clams with TrA contraction with GTB x15 and BTB x 10 LAQ x10 bilat AROM, x 10 with RTB, and  x 10 with 2.5#  PT gave pt a HEP  handout and educated pt in correct form and appropriate frequency.  See below for pt education                                                                                                                        PATIENT EDUCATION:  Education details:  objective findings, goal progress, HEP, exercise form, and POC/discharge planning. Person educated: Patient  Education method: Explanation, demonstration, verbal and tactile cues, handout Education comprehension: verbalized understanding, returned demonstration, verbal and tactile cues required  HOME EXERCISE PROGRAM: Access Code: [email or web address] URL: [email or web address] Date: 11/18/2023 Prepared by: Aaron Edelman  Exercises - Supine Transversus Abdominis Bracing - Hands on Stomach  - 2 x daily - 7 x weekly - 2 sets - 10 reps - 5 seconds hold - Supine March  - 1 x daily - 7 x weekly - 2 sets - 10 reps - Seated Hip Abduction with Resistance  - 1 x daily - 4-5 x weekly - 2 sets - 10 reps - Seated Long Arc Quad  - 1 x daily - 7 x weekly - 2 sets - 10 reps Updated HEP: Supine heel slides with TrA 2 sets of 10 reps, 1x/day  ASSESSMENT:  CLINICAL IMPRESSION:  Pt has made great progress in PT.  He responded well to aquatic therapy and finished aquatic therapy last visit.  Pt demonstrates improved functional LE strength and performance of transfers with improving on 30 sec sit to stand test from 4 reps with UE's initially to 10 reps without Ue's currently.  Pt performed exercises well with good tolerance.  PT reviewed HEP and updated HEP.  Pt given RTB, GTB, and BTB for HEP.  PT instructed pt in correct form and appropriate frequency.  He is independent with HEP.  Pt demonstrates clinically significant improvement in self perceived disability with LEFS improving from 41/80 to 51/80.  Pt has met all goals except the goals that PT didn't assess.  Pt reports he feels ready for discharge.  OBJECTIVE IMPAIRMENTS: Abnormal gait, decreased  activity tolerance, decreased balance, decreased endurance, decreased mobility, difficulty walking, decreased ROM, decreased strength, obesity, and pain.   ACTIVITY LIMITATIONS: carrying, lifting, bending, standing, squatting, stairs, transfers, and locomotion level  PARTICIPATION LIMITATIONS: meal prep, cleaning, shopping, community activity, occupation, and yard work  PERSONAL FACTORS: Time since onset of injury/illness/exacerbation and 3+ comorbidities: see PmHx  are also affecting patient's functional outcome.   REHAB POTENTIAL: Good  CLINICAL DECISION MAKING: Evolving/moderate complexity  EVALUATION COMPLEXITY: Moderate   GOALS: Goals reviewed with patient? Yes  SHORT TERM GOALS: Target date: 10/24/23  Pt will tolerate full aquatic sessions consistently without increase in pain and with improving function to demonstrate good toleration and effectiveness of intervention.  Baseline: Goal status: Met 10/21/23  2.  Pt will tolerate walking to and from setting and engaging in aquatic therapy session without excessive fatigue or increase in pain to demonstrate improved toleration to activity.  Baseline:  Goal status: In Progress - 10/18/23; Met 11/01/23  3.  Pt will perform SLS in 3.6 ft x 15s or > unsupported to demonstrate improving balance Baseline:  Goal status: In progress 10/21/23  4.  Pt will improve lumbar side bending by 50% Baseline: see chart Goal status: Met 11/01/23   LONG TERM GOALS: Target date: 11/25/23  Pt to improve on LEFS by at least 9 point to demonstrate statistically significant Improvement in function. Baseline: 41/80 Goal status: GOAL MET  4/11  2.  Pt will improve on 30s STS test to 7 or > to demonstrate improving functional lower extremity strength, transitional movements, and balance Baseline: 4 / 10 currently Goal status: GOAL MET 4/11  3.  Pt will improve on Tug test to <or= 15s  to demonstrate improvement in lower extremity function, mobility and  decreased fall risk. Baseline:19.91  / Current:  13.06 Goal status: GOAL MET  4/4  4.  Pt will improve on Berg balance test to >/= 43/56 to demonstrate a decrease in fall risk. Baseline:  Goal status: not assessed  5.  Pt will improve strength in right hip flex to at least 5lb from contralateral side to demonstrate improved overall physical function Baseline: see chart Goal status: GOAL MET  4/4  6.  Pt will have an improvement in right knee pain by 50%. Baseline: see chart Goal status:  GOAL MET  4/4  PLAN:   PLANNED INTERVENTIONS: 97164- PT Re-evaluation, 97110-Therapeutic exercises, 97530- Therapeutic activity, 97112- Neuromuscular re-education, 97535- Self Care, 16109- Manual therapy, [phone]- Gait training, [phone]- Orthotic Fit/training, [phone]- Aquatic Therapy, [phone]- Electrical stimulation (unattended), [phone]- Ionotophoresis 4mg /ml Dexamethasone, Patient/Family education, Balance training, Stair training, Taping, Dry Needling, Joint mobilization, DME instructions, Cryotherapy, and Moist heat.  PLAN FOR NEXT SESSION: Pt to be discharged from skilled PT due to meeting goals.  Pt is appreciative of skilled PT and is agreeable with discharge.  He will cont with aquatic and land HEP.    PHYSICAL THERAPY DISCHARGE SUMMARY  Visits from Start of Care: 12  Current functional level related to goals / functional outcomes: See above   Remaining deficits: See above   Education / Equipment: See above      Audie Clear III PT, DPT 11/25/23 12:45 PM

## 2023-12-13 DIAGNOSIS — E785 Hyperlipidemia, unspecified: Secondary | ICD-10-CM | POA: Diagnosis not present

## 2023-12-14 LAB — NMR, LIPOPROFILE
Cholesterol, Total: 116 mg/dL (ref 100–199)
HDL Particle Number: 26.9 umol/L — ABNORMAL LOW (ref >=30.5)
HDL-C: 37 mg/dL — ABNORMAL LOW (ref >39)
LDL Particle Number: 735 nmol/L (ref <1000)
LDL Size: 20.5 nm — ABNORMAL LOW (ref >20.5)
LDL-C (NIH Calc): 62 mg/dL (ref 0–99)
LP-IR Score: 55 — ABNORMAL HIGH (ref <=45)
Small LDL Particle Number: 365 nmol/L (ref <=527)
Triglycerides: 83 mg/dL (ref 0–149)

## 2023-12-16 ENCOUNTER — Encounter (HOSPITAL_BASED_OUTPATIENT_CLINIC_OR_DEPARTMENT_OTHER): Payer: Self-pay

## 2024-02-07 DIAGNOSIS — L578 Other skin changes due to chronic exposure to nonionizing radiation: Secondary | ICD-10-CM | POA: Diagnosis not present

## 2024-02-07 DIAGNOSIS — L815 Leukoderma, not elsewhere classified: Secondary | ICD-10-CM | POA: Diagnosis not present

## 2024-02-07 DIAGNOSIS — B351 Tinea unguium: Secondary | ICD-10-CM | POA: Diagnosis not present

## 2024-02-07 DIAGNOSIS — D1801 Hemangioma of skin and subcutaneous tissue: Secondary | ICD-10-CM | POA: Diagnosis not present

## 2024-06-19 DIAGNOSIS — M5416 Radiculopathy, lumbar region: Secondary | ICD-10-CM | POA: Diagnosis not present

## 2024-07-02 DIAGNOSIS — Z1159 Encounter for screening for other viral diseases: Secondary | ICD-10-CM | POA: Diagnosis not present

## 2024-07-02 DIAGNOSIS — E785 Hyperlipidemia, unspecified: Secondary | ICD-10-CM | POA: Diagnosis not present

## 2024-07-02 DIAGNOSIS — Z1331 Encounter for screening for depression: Secondary | ICD-10-CM | POA: Diagnosis not present

## 2024-07-02 DIAGNOSIS — Z794 Long term (current) use of insulin: Secondary | ICD-10-CM | POA: Diagnosis not present

## 2024-07-02 DIAGNOSIS — I10 Essential (primary) hypertension: Secondary | ICD-10-CM | POA: Diagnosis not present

## 2024-07-02 DIAGNOSIS — E1122 Type 2 diabetes mellitus with diabetic chronic kidney disease: Secondary | ICD-10-CM | POA: Diagnosis not present

## 2024-07-02 DIAGNOSIS — Z0000 Encounter for general adult medical examination without abnormal findings: Secondary | ICD-10-CM | POA: Diagnosis not present

## 2024-07-04 ENCOUNTER — Other Ambulatory Visit (HOSPITAL_BASED_OUTPATIENT_CLINIC_OR_DEPARTMENT_OTHER): Payer: Self-pay

## 2024-07-04 MED ORDER — COMIRNATY 30 MCG/0.3ML IM SUSY
0.3000 mL | PREFILLED_SYRINGE | Freq: Once | INTRAMUSCULAR | 0 refills | Status: AC
  Filled 2024-07-04: qty 0.3, 1d supply, fill #0

## 2024-07-04 MED ORDER — FLUZONE HIGH-DOSE 0.5 ML IM SUSY
0.5000 mL | PREFILLED_SYRINGE | Freq: Once | INTRAMUSCULAR | 0 refills | Status: AC
  Filled 2024-07-04: qty 0.5, 1d supply, fill #0

## 2024-07-10 DIAGNOSIS — H52203 Unspecified astigmatism, bilateral: Secondary | ICD-10-CM | POA: Diagnosis not present

## 2024-07-17 DIAGNOSIS — Z794 Long term (current) use of insulin: Secondary | ICD-10-CM | POA: Diagnosis not present

## 2024-07-17 DIAGNOSIS — N181 Chronic kidney disease, stage 1: Secondary | ICD-10-CM | POA: Diagnosis not present

## 2024-07-17 DIAGNOSIS — E1122 Type 2 diabetes mellitus with diabetic chronic kidney disease: Secondary | ICD-10-CM | POA: Diagnosis not present

## 2024-07-17 DIAGNOSIS — I10 Essential (primary) hypertension: Secondary | ICD-10-CM | POA: Diagnosis not present

## 2024-07-18 ENCOUNTER — Telehealth (HOSPITAL_BASED_OUTPATIENT_CLINIC_OR_DEPARTMENT_OTHER): Payer: Self-pay

## 2024-07-18 DIAGNOSIS — Z86018 Personal history of other benign neoplasm: Secondary | ICD-10-CM | POA: Diagnosis not present

## 2024-07-18 DIAGNOSIS — Z7901 Long term (current) use of anticoagulants: Secondary | ICD-10-CM | POA: Diagnosis not present

## 2024-07-18 DIAGNOSIS — I4891 Unspecified atrial fibrillation: Secondary | ICD-10-CM | POA: Diagnosis not present

## 2024-07-18 DIAGNOSIS — Z86718 Personal history of other venous thrombosis and embolism: Secondary | ICD-10-CM | POA: Diagnosis not present

## 2024-07-18 NOTE — Telephone Encounter (Signed)
   Pre-operative Risk Assessment    Patient Name: Victor Barnes  DOB: Jan 10, 1956 MRN: [id number]   Date of last office visit: 10/17/2023 with Rosaline Bane, NP Date of next office visit: 10/24/2024 with Dr. Jeffrie  Request for Surgical Clearance    Procedure:  colonoscopy  Date of Surgery:  Clearance 08/31/24                                 Surgeon:  Dr. Layla Lah Surgeon's Group or Practice Name:  [facility] Phone number:  [phone] Fax number:  [phone]   Type of Clearance Requested:   - Medical  - Pharmacy:  Hold Rivaroxaban  (Xarelto ) -Does not specify   Type of Anesthesia:  Propofol    Additional requests/questions:  None  SignedPatrcia Iverson CROME   07/18/2024, 3:37 PM

## 2024-07-23 DIAGNOSIS — M5416 Radiculopathy, lumbar region: Secondary | ICD-10-CM | POA: Diagnosis not present

## 2024-07-25 NOTE — Telephone Encounter (Signed)
 Patient with diagnosis of A Fib plus DVT on Xarelto  for anticoagulation.    Procedure: colonoscopy Date of procedure: 08/31/24   CHA2DS2-VASc Score = 3  This indicates a 3.2% annual risk of stroke. The patient's score is based upon: CHF History: 0 HTN History: 1 Diabetes History: 1 Stroke History: 0 Vascular Disease History: 0 Age Score: 1 Gender Score: 0    CrCl 125 ml/min using adj body weight Platelet count 234K  \Patient has not had an Afib/aflutter ablation in the last 3 months, DCCV within the last 4 weeks or a watchman implanted in the last 45 days    Per office protocol, patient can hold Xarelto   for 2 days prior to procedure.    **This guidance is not considered finalized until pre-operative APP has relayed final recommendations.**

## 2024-07-25 NOTE — Telephone Encounter (Signed)
° °  Name: Victor Barnes  DOB: 1956/07/20  MRN: [id number]  Primary Cardiologist: Oneil Parchment, MD   Preoperative team, please contact this patient and set up a phone call appointment for further preoperative risk assessment. Please obtain consent and complete medication review. Thank you for your help.  I confirm that guidance regarding antiplatelet and oral anticoagulation therapy has been completed and, if necessary, noted below.  I also confirmed the patient resides in the state of [place] . As per [facility] Medical Board telemedicine laws, the patient must reside in the state in which the provider is licensed.   [name], PA-C 07/25/2024, 4:48 PM [facility]

## 2024-07-26 ENCOUNTER — Telehealth (HOSPITAL_BASED_OUTPATIENT_CLINIC_OR_DEPARTMENT_OTHER): Payer: Self-pay | Admitting: *Deleted

## 2024-07-26 NOTE — Telephone Encounter (Signed)
 I s/w the pt and his wife. Pt has been scheduled tele preop appt 08/15/24. Med rec and consent are done.

## 2024-07-26 NOTE — Telephone Encounter (Signed)
 I s/w the pt and his wife. Pt has been scheduled tele preop appt 08/15/24. Med rec and consent are done.      Patient Consent for Virtual Visit        [name] has provided verbal consent on 07/26/2024 for a virtual visit (video or telephone).   CONSENT FOR VIRTUAL VISIT FOR:  Victor Barnes  By participating in this virtual visit I agree to the following:  I hereby voluntarily request, consent and authorize [facility] and its employed or contracted physicians, physician assistants, nurse practitioners or other licensed health care professionals (the Practitioner), to provide me with telemedicine health care services (the Services) as deemed necessary by the treating Practitioner. I acknowledge and consent to receive the Services by the Practitioner via telemedicine. I understand that the telemedicine visit will involve communicating with the Practitioner through live audiovisual communication technology and the disclosure of certain medical information by electronic transmission. I acknowledge that I have been given the opportunity to request an in-person assessment or other available alternative prior to the telemedicine visit and am voluntarily participating in the telemedicine visit.  I understand that I have the right to withhold or withdraw my consent to the use of telemedicine in the course of my care at any time, without affecting my right to future care or treatment, and that the Practitioner or I may terminate the telemedicine visit at any time. I understand that I have the right to inspect all information obtained and/or recorded in the course of the telemedicine visit and may receive copies of available information for a reasonable fee.  I understand that some of the potential risks of receiving the Services via telemedicine include:  Delay or interruption in medical evaluation due to technological equipment failure or disruption; Information transmitted may  not be sufficient (e.g. poor resolution of images) to allow for appropriate medical decision making by the Practitioner; and/or  In rare instances, security protocols could fail, causing a breach of personal health information.  Furthermore, I acknowledge that it is my responsibility to provide information about my medical history, conditions and care that is complete and accurate to the best of my ability. I acknowledge that Practitioner's advice, recommendations, and/or decision may be based on factors not within their control, such as incomplete or inaccurate data provided by me or distortions of diagnostic images or specimens that may result from electronic transmissions. I understand that the practice of medicine is not an exact science and that Practitioner makes no warranties or guarantees regarding treatment outcomes. I acknowledge that a copy of this consent can be made available to me via my patient portal [facility] MyChart), or I can request a printed copy by calling the office of [facility].    I understand that my insurance will be billed for this visit.   I have read or had this consent read to me. I understand the contents of this consent, which adequately explains the benefits and risks of the Services being provided via telemedicine.  I have been provided ample opportunity to ask questions regarding this consent and the Services and have had my questions answered to my satisfaction. I give my informed consent for the services to be provided through the use of telemedicine in my medical care

## 2024-08-15 ENCOUNTER — Ambulatory Visit: Attending: Internal Medicine | Admitting: Physician Assistant

## 2024-08-15 DIAGNOSIS — Z01810 Encounter for preprocedural cardiovascular examination: Secondary | ICD-10-CM | POA: Diagnosis not present

## 2024-08-15 NOTE — Progress Notes (Signed)
 "   Virtual Visit via Telephone Note   Because of Victor Barnes co-morbid illnesses, he is at least at moderate risk for complications without adequate follow up.  This format is felt to be most appropriate for this patient at this time.  Due to technical limitations with video connection (technology), today's appointment will be conducted as an audio only telehealth visit, and NATURE VOGELSANG verbally agreed to proceed in this manner.   All issues noted in this document were discussed and addressed.  No physical exam could be performed with this format.  Evaluation Performed:  Preoperative cardiovascular risk assessment _____________   Date:  08/15/2024   Patient ID:  Victor Barnes, DOB Sep 23, 1955, MRN [id number] Patient Location:  Home Provider location:   Office  Primary Care Provider:  Teresa Channel, MD Primary Cardiologist:  Oneil Parchment, MD  Chief Complaint / Patient Profile   68 y.o. y/o male with a h/o atrial fibrillation and hypertension, diabetes mellitus, DVT/PE who is pending colonoscopy scheduled for 08/31/2024 and presents today for telephonic preoperative cardiovascular risk assessment.  History of Present Illness    Victor Barnes is a 68 y.o. male who presents via audio/video conferencing for a telehealth visit today.  Pt was last seen in cardiology clinic on 10/17/2023 by Rosaline Bane, NP.  At that time Victor Barnes was doing well without any chest pain or shortness of breath.  The patient is now pending procedure as outlined above. Since his last visit, he has had no issues but of late his BP has been up a little. He has some back and knee issues, his walker helps. His knee does give him some trouble.  He does meet 4 METS on the DASI.  He is a member at [employer]. He goes swimming twice a week (prior to thanksgiving).   Per office protocol, patient can hold Xarelto  for 2 days prior to procedure.  Please resume when medically safe to do so.  Past  Medical History    Past Medical History:  Diagnosis Date   Atrial fibrillation or flutter    Should be on Coumadin  but h/o non-compliance; started Sotalol 08/2009   Cellulitis    Diabetes mellitus    DVT (deep venous thrombosis) (HCC)    Recurrent RLE DVT, supposed to be on Coumadin  but h/o non-compliance    Dysrhythmia    Hyperlipidemia    Hypertension    Myocardiopathy (HCC)    OSA on CPAP    Pulmonary embolism (HCC)    Sleep apnea    Venous stasis    Past Surgical History:  Procedure Laterality Date   CATARACT EXTRACTION Bilateral    LEG SURGERY     REVERSE SHOULDER ARTHROPLASTY Left 04/21/2023   Procedure: REVERSE SHOULDER ARTHROPLASTY;  Surgeon: Melita Drivers, MD;  Location: [facility];  Service: Orthopedics;  Laterality: Left;     Allergies  Allergies[1]  Home Medications    Prior to Admission medications  Medication Sig Start Date End Date Taking? Authorizing Provider  Continuous Blood Gluc Sensor (FREESTYLE LIBRE 3 SENSOR) MISC apply to upper back of arm and change sensor every 14 days for 28 days 06/21/22   [provider]  Evolocumab  (REPATHA  SURECLICK) 140 MG/ML SOAJ Inject 140 mg into the skin every 14 (fourteen) days. 10/21/23   Swinyer, Rosaline CHRISTELLA, NP  furosemide  (LASIX ) 40 MG tablet Take 40 mg by mouth daily.    [provider]  insulin  glargine (LANTUS ) 100 UNIT/ML injection Inject 30  Units into the skin daily.    [provider]  lisinopril  (ZESTRIL ) 10 MG tablet Take 10 mg by mouth daily. 02/19/23   [provider]  Semaglutide, 1 MG/DOSE, (OZEMPIC, 1 MG/DOSE,) 4 MG/3ML SOPN Inject 1 mg into the skin every 7 (seven) days.    [provider]  spironolactone  (ALDACTONE ) 25 MG tablet Take 1 tablet (25 mg total) by mouth daily. 10/17/23 07/26/24  Swinyer, Rosaline HERO, NP  tamsulosin (FLOMAX) 0.4 MG CAPS capsule Take 0.4 mg by mouth daily.    [provider]  XARELTO  20 MG TABS tablet TAKE 1 TABLET BY MOUTH EVERY  DAY 05/09/23   Jeffrie Oneil BROCKS, MD    Physical Exam    Vital Signs:  SATOSHI KALAS does not have vital signs available for review today.  BP 150/84, 155/68  Given telephonic nature of communication, physical exam is limited. AAOx3. NAD. Normal affect.  Speech and respirations are unlabored.  Accessory Clinical Findings    None  Assessment & Plan    1.  Preoperative Cardiovascular Risk Assessment:  Mr. Victor Barnes perioperative risk of a major cardiac event is 0.4% according to the Revised Cardiac Risk Index (RCRI).  Therefore, he is at low risk for perioperative complications.   His functional capacity is good at 6.55 METs according to the [facility] Activity Status Index (DASI). Recommendations: According to ACC/AHA guidelines, no further cardiovascular testing needed.  The patient may proceed to surgery at acceptable risk.   Antiplatelet and/or Anticoagulation Recommendations:  Xarelto  (Rivaroxaban ) can be held for 2 days prior to surgery.  Please resume post op when felt to be safe.     The patient was advised that if he develops new symptoms prior to surgery to contact our office to arrange for a follow-up visit, and he verbalized understanding.   A copy of this note will be routed to requesting surgeon.  Time:   Today, I have spent 10 minutes with the patient with telehealth technology discussing medical history, symptoms, and management plan.     Orren LOISE Fabry, PA-C  08/15/2024, 8:13 AM      [1]  Allergies Allergen Reactions   Ezetimibe  Other (See Comments)    Myalgia   Colesevelam     Other reaction(s): Other (See Comments)   Crestor [Rosuvastatin] Other (See Comments)    myalgias   Empagliflozin     Other reaction(s): Other (See Comments), yeast infections   Lipitor [Atorvastatin] Other (See Comments)    myalgias   Metformin  Hcl Er Diarrhea   Pravastatin Other (See Comments)    myalgias   "

## 2024-10-24 ENCOUNTER — Ambulatory Visit: Admitting: Cardiology
